# Patient Record
Sex: Female | Born: 1945
Health system: Southern US, Community
[De-identification: ages and names within clinical notes are randomized; demographics above are authoritative.]

## PROBLEM LIST (undated history)

## (undated) DIAGNOSIS — G4700 Insomnia, unspecified: Secondary | ICD-10-CM

## (undated) DIAGNOSIS — E559 Vitamin D deficiency, unspecified: Secondary | ICD-10-CM

## (undated) DIAGNOSIS — M25511 Pain in right shoulder: Secondary | ICD-10-CM

## (undated) DIAGNOSIS — IMO0001 Reserved for inherently not codable concepts without codable children: Secondary | ICD-10-CM

## (undated) DIAGNOSIS — F419 Anxiety disorder, unspecified: Secondary | ICD-10-CM

## (undated) DIAGNOSIS — T7840XA Allergy, unspecified, initial encounter: Secondary | ICD-10-CM

## (undated) DIAGNOSIS — M1990 Unspecified osteoarthritis, unspecified site: Secondary | ICD-10-CM

## (undated) DIAGNOSIS — J42 Unspecified chronic bronchitis: Secondary | ICD-10-CM

## (undated) DIAGNOSIS — Z9989 Dependence on other enabling machines and devices: Secondary | ICD-10-CM

## (undated) DIAGNOSIS — Z9889 Other specified postprocedural states: Secondary | ICD-10-CM

## (undated) DIAGNOSIS — G4733 Obstructive sleep apnea (adult) (pediatric): Secondary | ICD-10-CM

## (undated) DIAGNOSIS — R112 Nausea with vomiting, unspecified: Secondary | ICD-10-CM

## (undated) DIAGNOSIS — E785 Hyperlipidemia, unspecified: Secondary | ICD-10-CM

## (undated) DIAGNOSIS — I10 Essential (primary) hypertension: Secondary | ICD-10-CM

## (undated) DIAGNOSIS — K219 Gastro-esophageal reflux disease without esophagitis: Secondary | ICD-10-CM

## (undated) DIAGNOSIS — M25512 Pain in left shoulder: Secondary | ICD-10-CM

## (undated) HISTORY — DX: Vitamin D deficiency, unspecified: E55.9

## (undated) HISTORY — DX: Essential (primary) hypertension: I10

## (undated) HISTORY — DX: Insomnia, unspecified: G47.00

## (undated) HISTORY — DX: Pain in right shoulder: M25.511

## (undated) HISTORY — DX: Pain in left shoulder: M25.512

## (undated) HISTORY — DX: Unspecified osteoarthritis, unspecified site: M19.90

## (undated) HISTORY — PX: BREAST EXCISIONAL BIOPSY: SUR124

## (undated) HISTORY — DX: Hyperlipidemia, unspecified: E78.5

## (undated) HISTORY — PX: FRACTURE SURGERY: SHX138

## (undated) HISTORY — DX: Allergy, unspecified, initial encounter: T78.40XA

## (undated) HISTORY — DX: Anxiety disorder, unspecified: F41.9

---
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
## 1964-02-06 HISTORY — PX: APPENDECTOMY: SHX54

## 1978-02-05 HISTORY — PX: TOTAL ABDOMINAL HYSTERECTOMY: SHX209

## 1979-02-06 HISTORY — PX: CHOLECYSTECTOMY OPEN: SUR202

## 2002-04-28 ENCOUNTER — Emergency Department (HOSPITAL_COMMUNITY): Admission: EM | Admit: 2002-04-28 | Discharge: 2002-04-28 | Payer: Self-pay | Admitting: Emergency Medicine

## 2002-05-03 ENCOUNTER — Emergency Department (HOSPITAL_COMMUNITY): Admission: EM | Admit: 2002-05-03 | Discharge: 2002-05-03 | Payer: Self-pay | Admitting: Emergency Medicine

## 2007-01-28 ENCOUNTER — Encounter: Admission: RE | Admit: 2007-01-28 | Discharge: 2007-01-28 | Payer: Self-pay | Admitting: Cardiology

## 2010-09-29 ENCOUNTER — Ambulatory Visit: Payer: Self-pay | Admitting: Internal Medicine

## 2010-10-13 ENCOUNTER — Encounter: Payer: Self-pay | Admitting: Internal Medicine

## 2010-10-13 DIAGNOSIS — Z0001 Encounter for general adult medical examination with abnormal findings: Secondary | ICD-10-CM | POA: Insufficient documentation

## 2010-10-18 ENCOUNTER — Encounter: Payer: Self-pay | Admitting: Internal Medicine

## 2010-10-18 ENCOUNTER — Ambulatory Visit (INDEPENDENT_AMBULATORY_CARE_PROVIDER_SITE_OTHER): Payer: Medicare Other | Admitting: Internal Medicine

## 2010-10-18 ENCOUNTER — Other Ambulatory Visit (INDEPENDENT_AMBULATORY_CARE_PROVIDER_SITE_OTHER): Payer: Medicare Other

## 2010-10-18 VITALS — BP 142/72 | HR 88 | Temp 98.3°F | Ht 62.0 in | Wt 264.5 lb

## 2010-10-18 DIAGNOSIS — I10 Essential (primary) hypertension: Secondary | ICD-10-CM

## 2010-10-18 DIAGNOSIS — F329 Major depressive disorder, single episode, unspecified: Secondary | ICD-10-CM | POA: Insufficient documentation

## 2010-10-18 DIAGNOSIS — Z0000 Encounter for general adult medical examination without abnormal findings: Secondary | ICD-10-CM

## 2010-10-18 DIAGNOSIS — J302 Other seasonal allergic rhinitis: Secondary | ICD-10-CM | POA: Insufficient documentation

## 2010-10-18 DIAGNOSIS — M25512 Pain in left shoulder: Secondary | ICD-10-CM

## 2010-10-18 DIAGNOSIS — M1990 Unspecified osteoarthritis, unspecified site: Secondary | ICD-10-CM | POA: Insufficient documentation

## 2010-10-18 DIAGNOSIS — J45909 Unspecified asthma, uncomplicated: Secondary | ICD-10-CM | POA: Insufficient documentation

## 2010-10-18 DIAGNOSIS — Z23 Encounter for immunization: Secondary | ICD-10-CM

## 2010-10-18 DIAGNOSIS — F32A Depression, unspecified: Secondary | ICD-10-CM | POA: Insufficient documentation

## 2010-10-18 DIAGNOSIS — M25519 Pain in unspecified shoulder: Secondary | ICD-10-CM

## 2010-10-18 DIAGNOSIS — M25511 Pain in right shoulder: Secondary | ICD-10-CM

## 2010-10-18 HISTORY — DX: Pain in right shoulder: M25.511

## 2010-10-18 HISTORY — DX: Pain in right shoulder: M25.512

## 2010-10-18 LAB — LIPID PANEL
HDL: 53 mg/dL (ref >39.00)
Total CHOL/HDL Ratio: 3
Triglycerides: 115 mg/dL (ref 0.0–149.0)

## 2010-10-18 LAB — CBC WITH DIFFERENTIAL/PLATELET
Basophils Absolute: 0.1 10*3/uL (ref 0.0–0.1)
Eosinophils Absolute: 0.3 10*3/uL (ref 0.0–0.7)
Eosinophils Relative: 4.8 % (ref 0.0–5.0)
MCV: 88.4 fl (ref 78.0–100.0)
Monocytes Absolute: 0.4 10*3/uL (ref 0.1–1.0)
Neutrophils Relative %: 55.4 % (ref 43.0–77.0)
RBC: 4.34 Mil/uL (ref 3.87–5.11)

## 2010-10-18 LAB — URINALYSIS, ROUTINE W REFLEX MICROSCOPIC
Nitrite: NEGATIVE
Total Protein, Urine: NEGATIVE
Urobilinogen, UA: 0.2 (ref 0.0–1.0)

## 2010-10-18 LAB — TSH: TSH: 1.43 u[IU]/mL (ref 0.35–5.50)

## 2010-10-18 LAB — HEPATIC FUNCTION PANEL
Albumin: 3.7 g/dL (ref 3.5–5.2)
Alkaline Phosphatase: 84 U/L (ref 39–117)
Bilirubin, Direct: 0.1 mg/dL (ref 0.0–0.3)
Total Bilirubin: 0.5 mg/dL (ref 0.3–1.2)

## 2010-10-18 LAB — BASIC METABOLIC PANEL
Calcium: 9.1 mg/dL (ref 8.4–10.5)
Chloride: 107 mEq/L (ref 96–112)
Creatinine, Ser: 0.8 mg/dL (ref 0.4–1.2)
GFR: 99.32 mL/min (ref >60.00)
Glucose, Bld: 93 mg/dL (ref 70–99)
Potassium: 3.9 mEq/L (ref 3.5–5.1)

## 2010-10-18 MED ORDER — ASPIRIN EC 81 MG PO TBEC: 81.0000 mg | DELAYED_RELEASE_TABLET | Freq: Every day | ORAL | Status: AC

## 2010-10-18 MED ORDER — TRAMADOL HCL 50 MG PO TABS: 50.0000 mg | ORAL_TABLET | Freq: Four times a day (QID) | ORAL | Status: AC | PRN

## 2010-10-18 MED ORDER — LOSARTAN POTASSIUM 100 MG PO TABS: 100.0000 mg | ORAL_TABLET | Freq: Every day | ORAL | Status: DC

## 2010-10-18 MED ORDER — ASPIRIN EC 81 MG PO TBEC: 81.0000 mg | DELAYED_RELEASE_TABLET | Freq: Every day | ORAL | Status: DC

## 2010-10-18 NOTE — Patient Instructions (Addendum)
You had the tetanus shot today, and the flu shot Your EKG was ok today Take all new medications as prescribed - the tramadol, and the losartan for blood pressure Please also take Aspirin 81 mg - (Coated only) - 1 per day (OTC) You can also take Allegra for the allergies as needed (OTC) Please go to LAB in the Basement for the blood and/or urine tests to be done today Please call the phone number [phone] (the [employer]Tree System) for results of testing in 2-3 days;  When calling, simply dial the number, and when prompted enter the MRN number above (the Medical Record Number) and the # key, then the message should start. Please call if you would like to be referred for the colonoscopy You will be contacted regarding the referral for: orthopedic Please return in 1 year for your yearly visit, or sooner if needed, with Lab testing done 3-5 days before

## 2010-10-18 NOTE — Progress Notes (Signed)
Subjective:    Patient ID: Betty Green, female    DOB: 09/12/1945, 65 y.o.   MRN: [id number]  HPI Here for wellness and f/u;  Overall doing ok;  Pt denies CP, worsening SOB, DOE, wheezing, orthopnea, PND, worsening LE edema, palpitations, dizziness or syncope.  Pt denies neurological change such as new Headache, facial or extremity weakness.  Pt denies polydipsia, polyuria, or low sugar symptoms. Pt states overall good compliance with treatment and medications, good tolerability, and trying to follow lower cholesterol diet.  Pt denies worsening depressive symptoms, suicidal ideation or panic. No fever, wt loss, night sweats, loss of appetite, or other constitutional symptoms.  Pt states good ability with ADL's, low fall risk, home safety reviewed and adequate, no significant changes in hearing or vision, and occasionally active with exercise.  Ran out of BP med, used to be on 20 mg benicar but too expensive.  Main c/o  bilat shoulder  Pain, and requests orhto f/u - had cortisone to right shoulder prior to moving here per ortho in [place], but moved before could have the left shoulder. Never had the colonoscopy yet Past Medical History  Diagnosis Date  . Bronchitis   . Allergy   . Asthma   . Depression   . Hypertension   . Arthritis    Past Surgical History  Procedure Date  . Cholecystectomy 1981  . Appendectomy 1966  . Tonsillectomy 1963  . Abdominal hysterectomy 1980  . Bilateral oophorectomy     reports that she has never smoked. She does not have any smokeless tobacco history on file. She reports that she does not drink alcohol or use illicit drugs. family history includes Arthritis in her father, mother, and other; Cancer in her other; Heart disease in her father and mother; and Hypertension in her father and mother. Allergies  Allergen Reactions  . Augmentin     Hives   No current outpatient prescriptions on file prior to visit.   Review of Systems Review of Systems    Constitutional: Negative for diaphoresis, activity change, appetite change and unexpected weight change.  HENT: Negative for hearing loss, ear pain, facial swelling, mouth sores and neck stiffness.   Eyes: Negative for pain, redness and visual disturbance.  Respiratory: Negative for shortness of breath and wheezing.   Cardiovascular: Negative for chest pain and palpitations.  Gastrointestinal: Negative for diarrhea, blood in stool, abdominal distention and rectal pain.  Genitourinary: Negative for hematuria, flank pain and decreased urine volume.  Musculoskeletal: Negative for myalgias and joint swelling.  Skin: Negative for color change and wound.  Neurological: Negative for syncope and numbness.  Hematological: Negative for adenopathy.  Psychiatric/Behavioral: Negative for hallucinations, self-injury, decreased concentration and agitation.      Objective:   Physical Exam BP 142/72  Pulse 88  Temp(Src) 98.3 F (36.8 C) (Oral)  Ht 5\' 2"  (1.575 m)  Wt 264 lb 8 oz (119.976 kg)  BMI 48.38 kg/m2  SpO2 96% Physical Exam  VS noted Constitutional: Pt is oriented to person, place, and time. Appears well-developed and well-nourished.  HENT:  Head: Normocephalic and atraumatic.  Right Ear: External ear normal.  Left Ear: External ear normal.  Nose: Nose normal.  Mouth/Throat: Oropharynx is clear and moist.  Eyes: Conjunctivae and EOM are normal. Pupils are equal, round, and reactive to light.  Neck: Normal range of motion. Neck supple. No JVD present. No tracheal deviation present.  Cardiovascular: Normal rate, regular rhythm, normal heart sounds and intact distal pulses.  Pulmonary/Chest: Effort normal and breath sounds normal.  Abdominal: Soft. Bowel sounds are normal. There is no tenderness.  Musculoskeletal: Normal range of motion. Exhibits no edema.  Lymphadenopathy:  Has no cervical adenopathy.  Neurological: Pt is alert and oriented to person, place, and time. Pt has normal  reflexes. No cranial nerve deficit.  Skin: Skin is warm and dry. No rash noted.  Psychiatric:  Has  normal mood and affect. Behavior is normal.         Assessment & Plan:

## 2010-10-18 NOTE — Assessment & Plan Note (Signed)
C/w DJD per pt hx, for tramadol prn, and refer orthopedic

## 2010-10-18 NOTE — Assessment & Plan Note (Signed)
Overall doing well, age appropriate education and counseling updated, referrals for preventative services and immunizations addressed, dietary and smoking counseling addressed, most recent labs and ECG reviewed.  I have personally reviewed and have noted: 1) the patient's medical and social history 2) The pt's use of alcohol, tobacco, and illicit drugs 3) The patient's current medications and supplements 4) Functional ability including ADL's, fall risk, home safety risk, hearing and visual impairment 5) Diet and physical activities 6) Evidence for depression or mood disorder 7) The patient's height, weight, and BMI have been recorded in the chart I have made referrals, and provided counseling and education based on review of the above For immunizations today, ECG reviewed - sinus , no acute, and declines colonscopy at this time

## 2010-10-18 NOTE — Assessment & Plan Note (Signed)
Uncontrolled, benicar too expensive - for losartan 100 qd

## 2010-10-21 ENCOUNTER — Encounter: Payer: Self-pay | Admitting: Internal Medicine

## 2011-01-11 ENCOUNTER — Telehealth: Payer: Self-pay

## 2011-01-11 MED ORDER — ALPRAZOLAM 0.25 MG PO TABS: 0.2500 mg | ORAL_TABLET | Freq: Two times a day (BID) | ORAL | Status: DC | PRN

## 2011-01-11 NOTE — Telephone Encounter (Signed)
Done hardcopy to robin  

## 2011-01-11 NOTE — Telephone Encounter (Signed)
Patient mother passed away and she needs prescription to help her get through sent to [facility] [address].

## 2011-01-11 NOTE — Telephone Encounter (Signed)
Faxed prescription to pharmacy.

## 2011-05-31 ENCOUNTER — Ambulatory Visit (INDEPENDENT_AMBULATORY_CARE_PROVIDER_SITE_OTHER): Payer: Medicare Other | Admitting: Internal Medicine

## 2011-05-31 ENCOUNTER — Encounter: Payer: Self-pay | Admitting: Internal Medicine

## 2011-05-31 VITALS — BP 100/60 | HR 112 | Temp 98.1°F | Ht 62.5 in | Wt 270.2 lb

## 2011-05-31 DIAGNOSIS — F419 Anxiety disorder, unspecified: Secondary | ICD-10-CM

## 2011-05-31 DIAGNOSIS — F411 Generalized anxiety disorder: Secondary | ICD-10-CM

## 2011-05-31 DIAGNOSIS — J309 Allergic rhinitis, unspecified: Secondary | ICD-10-CM

## 2011-05-31 DIAGNOSIS — F329 Major depressive disorder, single episode, unspecified: Secondary | ICD-10-CM

## 2011-05-31 DIAGNOSIS — G4700 Insomnia, unspecified: Secondary | ICD-10-CM | POA: Insufficient documentation

## 2011-05-31 DIAGNOSIS — F32A Depression, unspecified: Secondary | ICD-10-CM

## 2011-05-31 HISTORY — DX: Insomnia, unspecified: G47.00

## 2011-05-31 MED ORDER — ZOLPIDEM TARTRATE 10 MG PO TABS: 10.0000 mg | ORAL_TABLET | Freq: Every evening | ORAL | Status: DC | PRN

## 2011-05-31 MED ORDER — ESCITALOPRAM OXALATE 10 MG PO TABS: 10.0000 mg | ORAL_TABLET | Freq: Every day | ORAL | Status: DC

## 2011-05-31 MED ORDER — FEXOFENADINE HCL 180 MG PO TABS: 180.0000 mg | ORAL_TABLET | Freq: Every day | ORAL | Status: DC

## 2011-05-31 MED ORDER — FLUTICASONE PROPIONATE 50 MCG/ACT NA SUSP: 2.0000 | Freq: Every day | NASAL | Status: DC

## 2011-05-31 MED ORDER — METHYLPREDNISOLONE ACETATE 80 MG/ML IJ SUSP
120.0000 mg | Freq: Once | INTRAMUSCULAR | Status: AC
  Administered 2011-05-31: 120 mg via INTRAMUSCULAR

## 2011-05-31 NOTE — Assessment & Plan Note (Signed)
Cont the xanax prn,  to f/u any worsening symptoms or concerns

## 2011-05-31 NOTE — Progress Notes (Signed)
Subjective:    Patient ID: Betty Green, female    DOB: 09-14-1945, 66 y.o.   MRN: [id number]  HPI  Here to f/u with acute onset bilat left > right tinnitus, ear fullenss, eustachian valve symtpoms/popping moderate for 2 wks without fever, HA, facial pain, ST, cough and Pt denies chest pain, increased sob or doe, wheezing, orthopnea, PND, increased LE swelling, palpitations, dizziness or syncope.  Pt denies new neurological symptoms such as new headache, or facial or extremity weakness or numbness   Pt denies polydipsia, polyuria.  Does have increased stress recently assoc with several hot flashes and occas and day night sweat episodes sometime drenching.  Has had mild worsening depressive symptoms, but no suicidal ideation, or panic, though has ongoing anxiety, with increased insomnia as well, sometimes only gets 3-4 hrs sleep per night.   Pt denies fever, wt loss, night sweats, loss of appetite, or other constitutional symptoms Past Medical History  Diagnosis Date  . Bronchitis   . Allergy   . Asthma   . Depression   . Hypertension   . Arthritis   . Shoulder pain, bilateral 10/18/2010  . Anxiety 05/31/2011  . Insomnia 05/31/2011   Past Surgical History  Procedure Date  . Cholecystectomy 1981  . Appendectomy 1966  . Tonsillectomy 1963  . Abdominal hysterectomy 1980  . Bilateral oophorectomy     reports that she has never smoked. She does not have any smokeless tobacco history on file. She reports that she does not drink alcohol or use illicit drugs. family history includes Arthritis in her father, mother, and other; Cancer in her other; Heart disease in her father and mother; and Hypertension in her father and mother. Allergies  Allergen Reactions  . Augmentin     Hives   Current Outpatient Prescriptions on File Prior to Visit  Medication Sig Dispense Refill  . ALPRAZolam (XANAX) 0.25 MG tablet Take 1 tablet (0.25 mg total) by mouth 2 (two) times daily as needed for anxiety.  60  tablet  0  . aspirin EC 81 MG tablet Take 1 tablet (81 mg total) by mouth daily.  150 tablet  2  . losartan (COZAAR) 100 MG tablet Take 1 tablet (100 mg total) by mouth daily.  30 tablet  11  . escitalopram (LEXAPRO) 10 MG tablet Take 1 tablet (10 mg total) by mouth daily.  90 tablet  3  . fexofenadine (ALLEGRA) 180 MG tablet Take 1 tablet (180 mg total) by mouth daily.  30 tablet  11  . fluticasone (FLONASE) 50 MCG/ACT nasal spray Place 2 sprays into the nose daily.  16 g  2  . zolpidem (AMBIEN) 10 MG tablet Take 1 tablet (10 mg total) by mouth at bedtime as needed for sleep.  30 tablet  5   No current facility-administered medications on file prior to visit.   Review of Systems Review of Systems  Constitutional: Negative for diaphoresis and unexpected weight change.    Eyes: Negative for photophobia and visual disturbance.  Respiratory: Negative for choking and stridor.   Gastrointestinal: Negative for vomiting and blood in stool.  Genitourinary: Negative for hematuria and decreased urine volume.  Musculoskeletal: Negative for gait problem.  Skin: Negative for color change and wound.  Neurological: Negative for tremors and numbness.  Objective:   Physical Exam BP 100/60  Pulse 112  Temp(Src) 98.1 F (36.7 C) (Oral)  Ht 5' 2.5" (1.588 m)  Wt 270 lb 4 oz (122.585 kg)  BMI 48.64 kg/m2  SpO2 95% Physical Exam  VS noted, not ill appaering Constitutional: Pt appears well-developed and well-nourished.  HENT: Head: Normocephalic.  Right Ear: External ear normal.  Left Ear: External ear normal.  Bilat tm's mild erythema.  Sinus nontender.  Pharynx mild erythema Eyes: Conjunctivae and EOM are normal. Pupils are equal, round, and reactive to light.  Neck: Normal range of motion. Neck supple.  Cardiovascular: Normal rate and regular rhythm.   Pulmonary/Chest: Effort normal and breath sounds normal.  Neurological: Pt is alert. Not confused Psychiatric: Pt behavior is normal. Thought  content normal. 1+ nervous, mild depressed affect    Assessment & Plan:

## 2011-05-31 NOTE — Assessment & Plan Note (Addendum)
With hot flashes, for lexapro 10 qd,  to f/u any worsening symptoms or concerns, verified nonsuicidal, delcines counseling

## 2011-05-31 NOTE — Patient Instructions (Addendum)
You had the steroid shot today Take all new medications as prescribed - the allegra and flonase as directed, the lexapro and [place] Continue all other medications as before You will be contacted regarding the referral for: allergist You can also take Mucinex (or it's generic off brand) for congestion

## 2011-05-31 NOTE — Assessment & Plan Note (Signed)
For ambien prn,,  to f/u any worsening symptoms or concerns

## 2011-05-31 NOTE — Assessment & Plan Note (Signed)
Mod flare for depomedrol IM, and allegra/flonase, as well as mucinex otc prn

## 2011-06-04 ENCOUNTER — Ambulatory Visit (INDEPENDENT_AMBULATORY_CARE_PROVIDER_SITE_OTHER): Payer: Medicare Other | Admitting: Internal Medicine

## 2011-06-04 ENCOUNTER — Other Ambulatory Visit: Payer: Medicare Other

## 2011-06-04 ENCOUNTER — Encounter: Payer: Self-pay | Admitting: Internal Medicine

## 2011-06-04 ENCOUNTER — Other Ambulatory Visit: Payer: Self-pay | Admitting: Internal Medicine

## 2011-06-04 VITALS — BP 126/82 | HR 113 | Ht 63.0 in | Wt 269.8 lb

## 2011-06-04 DIAGNOSIS — J309 Allergic rhinitis, unspecified: Secondary | ICD-10-CM

## 2011-06-04 DIAGNOSIS — J45909 Unspecified asthma, uncomplicated: Secondary | ICD-10-CM

## 2011-06-04 DIAGNOSIS — G4733 Obstructive sleep apnea (adult) (pediatric): Secondary | ICD-10-CM

## 2011-06-04 NOTE — Patient Instructions (Signed)
Order- lab- Allergy/ Food Allergy profile    Dx allergic rhinitis  Order- schedule split protocol NPSG  Dx OSA  Schedule return for allergy skin testing- Antihistamines like loratadine block the skin tests. Please do not take any antihistamines, cold or sinus preparations,  otc sleep medicines for 3 days before the skin tests.   In the meantime, suggest you try taking an otc antihistamine Claritin/ loratadine as needed for itching and allergy   Consider the dust and pollen precaution information I gave you.

## 2011-06-04 NOTE — Progress Notes (Signed)
06/04/11- 66 yoF former smoker referred by Dr Jonny Ruiz for allergy evaluation. She also gives hx of past dx of obstructive sleep apnea, tested and treated years ago in [place]. No longer has CPAP. Recognized respiratory allergies since at least 1983. Symptoms had been seasonal years ago but has been perennial in recent years. She had lived in [place] until age 66 and returned 66 years ago. While living in [place] she had allergy shots which helped. Triggers have included cats, seasonal pollens and strong odors such as perfumes. Wheezes intermittently, mostly at night. History of sleep apnea but quit CPAP because it was "too loud". Surgery has included tonsillectomy. Medical problems have included hypertension asthma allergies and sleep apnea. She denies history of intolerance to foods, unusual reaction to cosmetics or to insect stings. Environment-condo, indoor heat makes her itch, denies mold, no pets or smokers.  Prior to Admission medications   Medication Sig Start Date End Date Taking? Authorizing Provider  aspirin EC 81 MG tablet Take 1 tablet (81 mg total) by mouth daily. 10/18/10 10/18/15 Yes Corwin Levins, MD  fluticasone (FLONASE) 50 MCG/ACT nasal spray Place 2 sprays into the nose daily. 05/31/11 05/30/12 Yes Corwin Levins, MD  losartan (COZAAR) 100 MG tablet Take 1 tablet (100 mg total) by mouth daily. 10/18/10 10/18/11 Yes Corwin Levins, MD  zolpidem (AMBIEN) 10 MG tablet Take 1 tablet (10 mg total) by mouth at bedtime as needed for sleep. 05/31/11 06/30/11 Yes Corwin Levins, MD   Past Medical History  Diagnosis Date  . Bronchitis   . Allergy   . Asthma   . Depression   . Hypertension   . Arthritis   . Shoulder pain, bilateral 10/18/2010  . Anxiety 05/31/2011  . Insomnia 05/31/2011   Past Surgical History  Procedure Date  . Cholecystectomy 1981  . Appendectomy 1966  . Tonsillectomy 1963  . Abdominal hysterectomy 1980  . Bilateral oophorectomy    Family History  Problem  Relation Age of Onset  . Arthritis Mother   . Heart disease Mother   . Hypertension Mother   . Arthritis Father   . Heart disease Father   . Hypertension Father   . Arthritis Other   . Cancer Other     colon and prostate cancer   History   Social History  . Marital Status: Single    Spouse Name: N/A    Number of Children: 2  . Years of Education: 12   Occupational History  . retired [employer]   Social History Main Topics  . Smoking status: Former Smoker -- 0.1 packs/day for 2 years    Types: Cigarettes    Quit date: 02/06/1984  . Smokeless tobacco: Not on file  . Alcohol Use: No  . Drug Use: No  . Sexually Active: Not on file   Other Topics Concern  . Not on file   Social History Narrative  . No narrative on file   ROS-see HPI Constitutional:   No-   weight loss, night sweats, fevers, chills, fatigue, lassitude. HEENT:   No-  headaches, difficulty swallowing, tooth/dental problems, sore throat, + tinnitus      +  sneezing, itching, ear ache, nasal congestion, post nasal drip,  CV:  No-   chest pain, orthopnea, PND, swelling in lower extremities, anasarca,  dizziness, palpitations Resp: +shortness of breath with exertion or at rest.              No-   productive cough,  No non-productive cough,  No- coughing up of blood.              No-   change in color of mucus.  No- wheezing.   Skin: No-   rash or lesions. GI:  + heartburn, indigestion, No- abdominal pain, nausea, vomiting, diarrhea,                 change in bowel habits, loss of appetite GU: No-   dysuria, change in color of urine, no urgency or frequency.  No- flank pain. MS:  A+   joint pain or swelling.  No- decreased range of motion.  No- back pain. Neuro-     nothing unusual Psych:  No- change in mood or affect. No depression or anxiety.  No memory loss.  OBJ- Physical Exam General- Alert, Oriented, Affect-appropriate, Distress- none acute, obese Skin- rash-none, lesions- none, excoriation-  none Lymphadenopathy- none Head- atraumatic            Eyes- Gross vision intact, PERRLA, conjunctivae and secretions clear            Ears- Hearing, canals-normal            Nose- Clear, no-Septal dev, mucus, polyps, erosion, perforation             Throat- Mallampati III , mucosa clear , drainage- none, tonsils- atrophic Neck- flexible , trachea midline, no stridor , thyroid nl, carotid no bruit Chest - symmetrical excursion , unlabored           Heart/CV- RRR , no murmur , no gallop  , no rub, nl s1 s2                           - JVD- none , edema- none, stasis changes- none, varices- none           Lung- clear to P&A, wheeze- none, cough- none , dullness-none, rub- none           Chest wall-  Abd- tender-no, distended-no, bowel sounds-present, HSM- no Br/ Gen/ Rectal- Not done, not indicated Extrem- cyanosis- none, clubbing, none, atrophy- none, strength- nl Neuro- grossly intact to observation

## 2011-06-05 LAB — ALLERGY FULL AND FOOD SPECIFIC PROFILE
Allergen, D pternoyssinus,d7: 19.9 kU/L — ABNORMAL HIGH (ref <0.35)
Allergen,Goose feathers, e70: <0.1 kU/L (ref <0.35)
Alternaria Alternata: 0.13 kU/L (ref <0.35)
Bahia Grass: <0.1 kU/L (ref <0.35)
Candida Albicans: 49.5 kU/L — ABNORMAL HIGH (ref <0.35)
Common Ragweed: 0.37 kU/L — ABNORMAL HIGH (ref <0.35)
D. farinae: 43.5 kU/L — ABNORMAL HIGH (ref <0.35)
Fish Cod: <0.1 kU/L (ref <0.35)
G005 Rye, Perennial: <0.1 kU/L (ref <0.35)
G009 Red Top: <0.1 kU/L (ref <0.35)
Helminthosporium halodes: <0.1 kU/L (ref <0.35)
Lamb's Quarters: 0.11 kU/L (ref <0.35)
Oak: <0.1 kU/L (ref <0.35)
Peanut IgE: 0.51 kU/L — ABNORMAL HIGH (ref <0.35)
Plantain: <0.1 kU/L (ref <0.35)
Shrimp IgE: 0.38 kU/L — ABNORMAL HIGH (ref <0.35)
Soybean IgE: <0.1 kU/L (ref <0.35)
Timothy Grass: <0.1 kU/L (ref <0.35)
Tomato IgE: <0.1 kU/L (ref <0.35)
Wheat IgE: <0.1 kU/L (ref <0.35)

## 2011-06-06 NOTE — Progress Notes (Signed)
Quick Note:  Spoke with pt and notified of results per Dr. Young. Pt verbalized understanding and denied any questions.  ______ 

## 2011-06-07 NOTE — Assessment & Plan Note (Addendum)
Plan-educated on and environmental precautions for dust control. Allergy profile. Loratadine. Schedule return for allergy skin testing.

## 2011-06-07 NOTE — Assessment & Plan Note (Signed)
Mild intermittent asthma, mostly at night.

## 2011-06-07 NOTE — Assessment & Plan Note (Signed)
History of documented sleep apnea, noncompliant with CPAP which was stopped years ago. She does wish to reassess this problem. Plan-schedule skin testing.

## 2011-06-20 ENCOUNTER — Ambulatory Visit: Payer: Medicare Other | Admitting: Internal Medicine

## 2011-06-26 ENCOUNTER — Encounter (HOSPITAL_BASED_OUTPATIENT_CLINIC_OR_DEPARTMENT_OTHER): Payer: Medicare Other

## 2011-07-17 ENCOUNTER — Ambulatory Visit (HOSPITAL_BASED_OUTPATIENT_CLINIC_OR_DEPARTMENT_OTHER): Payer: Medicare Other | Attending: Internal Medicine | Admitting: General Practice

## 2011-07-17 DIAGNOSIS — G4733 Obstructive sleep apnea (adult) (pediatric): Secondary | ICD-10-CM | POA: Insufficient documentation

## 2011-07-21 DIAGNOSIS — R0609 Other forms of dyspnea: Secondary | ICD-10-CM

## 2011-07-21 DIAGNOSIS — R0989 Other specified symptoms and signs involving the circulatory and respiratory systems: Secondary | ICD-10-CM

## 2011-07-21 DIAGNOSIS — G4733 Obstructive sleep apnea (adult) (pediatric): Secondary | ICD-10-CM

## 2011-07-21 NOTE — Procedures (Signed)
NAME:  Betty Green, Betty Green                 ACCOUNT NO.:  [id number]  MEDICAL RECORD NO.:  [id number]          PATIENT TYPE:  OUT  LOCATION:  SLEEP CENTER                 FACILITY:  [facility]  PHYSICIAN:  Tayra Dawe D. Maple Hudson, MD, FCCP, FACPDATE OF BIRTH:  07[birth year]  DATE OF STUDY:  07/17/2011                           NOCTURNAL POLYSOMNOGRAM  REFERRING PHYSICIAN:  Camerin Jimenez D. Felishia Wartman, MD, FCCP, FACP  INDICATION FOR STUDY:  Hypersomnia with sleep apnea.  EPWORTH SLEEPINESS SCORE:  1/24, BMI 48, weight 271 pounds, height 63 inches, neck 14 inches.  MEDICATIONS:  Home medications are charted and reviewed.  SLEEP ARCHITECTURE:  Split-study protocol.  During the diagnostic phase, total sleep time 128 minutes with sleep efficiency 75.7%.  Stage I was 19.9%, stage II 79.3%, stage III 0.8%, REM absent.  Sleep latency 18.5 minutes, awake after sleep onset 20 minutes, arousal index 53.9.  BEDTIME MEDICATION:  None.  Sleep was marked by fragmentation with frequent awakenings throughout the night.  RESPIRATORY DATA:  Split-study protocol.  Apnea-hypopnea index (AHI) 37 per hour.  A total of 79 events was scored including 59 obstructive apneas and 20 hypopneas.  The events were associated with non supine sleep position.  CPAP was titrated to 12 CWP, AHI 3.2 per hour.  She wore a standard [employer] Pilairo nasal pillow mask with heated humidifier and a C-flex setting of 3.  OXYGEN DATA:  Moderately loud snoring before CPAP with oxygen desaturation to a nadir of 91% on room air.  With CPAP titration, snoring was prevented and mean oxygen saturation held 96.8% on room air.  CARDIAC DATA:  Sinus rhythm with rare PAC.  MOVEMENT-PARASOMNIA:  Limb jerks were noted during the diagnostic phase with little effect on sleep. No bathroom trips.  IMPRESSIONS-RECOMMENDATIONS: 1. Severe obstructive sleep apnea/hypopnea syndrome, AHI 37 per hour     with supine and non supine events.  Moderately loud snoring  with     oxygen desaturation to a nadir of 91% on room air. 2. Successful CPAP titration to 12 CWP, AHI 3.2 per hour.  She wore a     standard [employer] Pilairo nasal pillow mask with heated     humidifier and a C-flex setting of 3. 3. Sleep fragmentation persisted throughout the study night.  If     clinically appropriate, consider adding a sleep medication to     assist sleep consolidation while adjusting to CPAP in the home     environment.     Caetano Oberhaus D. Maple Hudson, MD, [facility], FACP Diplomate, American Board of Sleep Medicine    CDY/MEDQ  D:  07/21/2011 11:18:59  T:  07/21/2011 19:32:55  Job:  [id number]

## 2011-09-12 ENCOUNTER — Ambulatory Visit (INDEPENDENT_AMBULATORY_CARE_PROVIDER_SITE_OTHER): Payer: Medicare Other | Admitting: Internal Medicine

## 2011-09-12 ENCOUNTER — Encounter: Payer: Self-pay | Admitting: Internal Medicine

## 2011-09-12 VITALS — BP 142/90 | HR 102 | Ht 63.0 in | Wt 267.8 lb

## 2011-09-12 DIAGNOSIS — J309 Allergic rhinitis, unspecified: Secondary | ICD-10-CM

## 2011-09-12 DIAGNOSIS — L309 Dermatitis, unspecified: Secondary | ICD-10-CM

## 2011-09-12 DIAGNOSIS — J302 Other seasonal allergic rhinitis: Secondary | ICD-10-CM

## 2011-09-12 DIAGNOSIS — G4733 Obstructive sleep apnea (adult) (pediatric): Secondary | ICD-10-CM

## 2011-09-12 DIAGNOSIS — J45909 Unspecified asthma, uncomplicated: Secondary | ICD-10-CM

## 2011-09-12 DIAGNOSIS — J3089 Other allergic rhinitis: Secondary | ICD-10-CM

## 2011-09-12 DIAGNOSIS — L259 Unspecified contact dermatitis, unspecified cause: Secondary | ICD-10-CM

## 2011-09-12 MED ORDER — PIMECROLIMUS 1 % EX CREA: TOPICAL_CREAM | Freq: Two times a day (BID) | CUTANEOUS | Status: DC

## 2011-09-12 NOTE — Patient Instructions (Addendum)
We will watch to see how well your allergies are controlled with an antihistamine like benadryl or loratadine  We can decide to start allergy shots later if needed.  Script to try Elidel cream for your eczema. This should be used only for as long as you need it, then stop till your skin flares again.   Order- DME- CPAP 12, mask of choice, humidifier, supplies   Dx OSA

## 2011-09-12 NOTE — Progress Notes (Signed)
06/04/11- 30 yoF former smoker referred by Dr Jonny Ruiz for allergy evaluation. She also gives hx of past dx of obstructive sleep apnea, tested and treated years ago in [place]. No longer has CPAP. Recognized respiratory allergies since at least 1983. Symptoms had been seasonal years ago but has been perennial in recent years. She had lived in [place] until age 66 and returned 2 years ago. While living in [place] she had allergy shots which helped. Triggers have included cats, seasonal pollens and strong odors such as perfumes. Wheezes intermittently, mostly at night. History of sleep apnea but quit CPAP because it was "too loud". Surgery has included tonsillectomy. Medical problems have included hypertension asthma allergies and sleep apnea. She denies history of intolerance to foods, unusual reaction to cosmetics or to insect stings. Environment-condo, indoor heat makes her itch, denies mold, no pets or smokers.  09/12/11- 20 yoF former smoker referred by Dr Jonny Ruiz for allergy evaluation. She also gives hx of past dx of obstructive sleep apnea, tested and treated years ago in [place]. No longer has CPAP. She blames allergies for 2 episodes of "pinkeye" since last here without exposure to recognized colds. Periorbital eczema-eyes itch worse if overdry by antihistamines. Allergy Profile 06/04/11- Total IgE 476.8, significant elevations for ragweed, dust, cat, shrimp, peanut. Educated to avoid foods that she recognizes cause problems and watch very closely identified foods which she has not associated symptoms in the past. Allergy skin tests: 09/12/2011- significant positive for grass and weed pollens, house dust and some molds but not foods. Her icthyotic/eczematoid skin made tests harder to read. OSA-NPSG 07/17/11- AHI 37/hr. severe obstructive sleep apnea CPAP titrated to 12/AHI 3.2 per hour. We reviewed these results, discussed medical concerns associated with untreated sleep apnea and  discussed treatment choices again.  ROS-see HPI Constitutional:   No-   weight loss, night sweats, fevers, chills, fatigue, lassitude. HEENT:   No-  headaches, difficulty swallowing, tooth/dental problems, sore throat, + tinnitus      +  sneezing, itching, ear ache, nasal congestion, post nasal drip,  CV:  No-   chest pain, orthopnea, PND, swelling in lower extremities, anasarca,  dizziness, palpitations Resp: +shortness of breath with exertion or at rest.              No-   productive cough,  No non-productive cough,  No- coughing up of blood.              No-   change in color of mucus.  No- wheezing.   Skin: No-   rash or lesions. GI:  + heartburn, indigestion, No- abdominal pain, nausea, vomiting,  GU:  MS:  No-    joint pain or swelling. . Neuro-     nothing unusual Psych:  No- change in mood or affect. No depression or anxiety.  No memory loss.  OBJ- Physical Exam General- Alert, Oriented, Affect-appropriate, Distress- none acute, obese Skin- changes of eczema around the neck and across her back and upper arms with no acute erosion or excoriation she Lymphadenopathy- none Head- atraumatic            Eyes- Gross vision intact, PERRLA, conjunctivae and secretions clear            Ears- Hearing, canals-normal            Nose- Clear, no-Septal dev, mucus, polyps, erosion, perforation             Throat- Mallampati III , mucosa clear , drainage- none, tonsils- atrophic  Neck- flexible , trachea midline, no stridor , thyroid nl, carotid no bruit Chest - symmetrical excursion , unlabored           Heart/CV- RRR , no murmur , no gallop  , no rub, nl s1 s2                           - JVD- none , edema- none, stasis changes- none, varices- none           Lung- clear to P&A, wheeze- none, cough- none , dullness-none, rub- none           Chest wall-  Abd- tender-no, distended-no, bowel sounds-present, HSM- no Br/ Gen/ Rectal- Not done, not indicated Extrem- cyanosis- none, clubbing, none,  atrophy- none, strength- nl Neuro- grossly intact to observation      She

## 2011-09-19 ENCOUNTER — Encounter: Payer: Self-pay | Admitting: Internal Medicine

## 2011-09-19 DIAGNOSIS — L309 Dermatitis, unspecified: Secondary | ICD-10-CM | POA: Insufficient documentation

## 2011-09-19 NOTE — Assessment & Plan Note (Signed)
Educated on obstructive sleep apnea medical concerns, issues of weight and driving safety responsibility, treatment options. Plan-begin CPAP at 12 CWP

## 2011-09-19 NOTE — Assessment & Plan Note (Signed)
Skin changes on exam chronic eczema. Plan-discussed her lack of adequate control with previous trials moisturizing topical therapies and limited use of steroids. Try Elidel with education on use of least amount for short period of time necessary.Marland Kitchen

## 2011-09-19 NOTE — Assessment & Plan Note (Signed)
Currently controlled without active wheezing. No results of allergy skin testing to state. Environmental precautions emphasized.

## 2011-09-19 NOTE — Assessment & Plan Note (Addendum)
Allergic rhinitis and conjunctivitis.  There is an important atopic component confirmed. Our discussion emphasized environmental precautions. We discussed allergy vaccine which is an option. Now during the summer she will work with antihistamines and we will reassess a return visit in the fall. She will try otc allergy eye drops such as Allaway.

## 2011-10-01 ENCOUNTER — Encounter: Payer: Self-pay | Admitting: Internal Medicine

## 2011-10-30 ENCOUNTER — Ambulatory Visit (INDEPENDENT_AMBULATORY_CARE_PROVIDER_SITE_OTHER): Payer: Medicare Other | Admitting: Internal Medicine

## 2011-10-30 ENCOUNTER — Encounter: Payer: Self-pay | Admitting: Internal Medicine

## 2011-10-30 VITALS — BP 110/72 | HR 100 | Temp 97.6°F | Ht 63.0 in | Wt 271.5 lb

## 2011-10-30 DIAGNOSIS — L309 Dermatitis, unspecified: Secondary | ICD-10-CM

## 2011-10-30 DIAGNOSIS — J45909 Unspecified asthma, uncomplicated: Secondary | ICD-10-CM

## 2011-10-30 DIAGNOSIS — I10 Essential (primary) hypertension: Secondary | ICD-10-CM

## 2011-10-30 DIAGNOSIS — L259 Unspecified contact dermatitis, unspecified cause: Secondary | ICD-10-CM

## 2011-10-30 DIAGNOSIS — Z23 Encounter for immunization: Secondary | ICD-10-CM

## 2011-10-30 DIAGNOSIS — F329 Major depressive disorder, single episode, unspecified: Secondary | ICD-10-CM

## 2011-10-30 DIAGNOSIS — F32A Depression, unspecified: Secondary | ICD-10-CM

## 2011-10-30 MED ORDER — FLUTICASONE PROPIONATE 50 MCG/ACT NA SUSP: 2.0000 | Freq: Every day | NASAL | Status: DC

## 2011-10-30 MED ORDER — HYDROCODONE-HOMATROPINE 5-1.5 MG/5ML PO SYRP: 5.0000 mL | ORAL_SOLUTION | Freq: Four times a day (QID) | ORAL | Status: DC | PRN

## 2011-10-30 MED ORDER — LOSARTAN POTASSIUM 100 MG PO TABS: 100.0000 mg | ORAL_TABLET | Freq: Every day | ORAL | Status: DC

## 2011-10-30 MED ORDER — FLUTICASONE-SALMETEROL 500-50 MCG/DOSE IN AEPB: 1.0000 | INHALATION_SPRAY | Freq: Two times a day (BID) | RESPIRATORY_TRACT | Status: DC

## 2011-10-30 MED ORDER — PREDNISONE 10 MG PO TABS: ORAL_TABLET | ORAL | Status: DC

## 2011-10-30 MED ORDER — HYDROXYZINE HCL 25 MG PO TABS: ORAL_TABLET | ORAL | Status: DC

## 2011-10-30 NOTE — Assessment & Plan Note (Signed)
stable overall by hx and exam, most recent data reviewed with pt, and pt to continue medical treatment as before BP Readings from Last 3 Encounters:  10/30/11 110/72  09/12/11 142/90  06/04/11 126/82

## 2011-10-30 NOTE — Assessment & Plan Note (Signed)
Mild to mod worsening, for atarax prn, for derm referral,  to f/u any worsening symptoms or concerns

## 2011-10-30 NOTE — Patient Instructions (Addendum)
You had the flu shot today Take all new medications as prescribed - the hydroxyzine for itching, prednisone for wheezing and eczema, and cough med Please take the Advair 500/50 twice per day for 1 month, then try to stop Your losartan was refilled You will be contacted regarding the referral for: dermatology Please keep your appointments with your specialists as you have planned - Dr Maple Hudson

## 2011-10-30 NOTE — Assessment & Plan Note (Signed)
C/w seasonal worsening it seems, for predpack asd, and advair 500/50 bid for 1 mo, then trial off (gave in samples today)

## 2011-10-30 NOTE — Progress Notes (Signed)
Subjective:    Patient ID: Betty Green, female    DOB: Jan 15, 1946, 66 y.o.   MRN: [id number]   HPI  Here with 2-3 wks onset nonprod cough with mild sob/doe with intermittent wheezing, without HA, ST, CP and Pt denies orthopnea, PND, increased LE swelling, palpitations, dizziness or syncope.  Pt denies fever, wt loss, night sweats, loss of appetite, or other constitutional symptoms  Has hx of asthma, seems similar, no current inhalers.  Due for flu shot.  Also with eczema out of control, marked itching especially to her back.  Asks for cough med, refill losartan. Pt denies new neurological symptoms such as new headache, or facial or extremity weakness or numbness   Pt denies polydipsia, polyuria.  Denies worsening depressive symptoms, suicidal ideation, or panic Past Medical History  Diagnosis Date  . Bronchitis   . Allergy   . Asthma   . Depression   . Hypertension   . Arthritis   . Shoulder pain, bilateral 10/18/2010  . Anxiety 05/31/2011  . Insomnia 05/31/2011   Past Surgical History  Procedure Date  . Cholecystectomy 1981  . Appendectomy 1966  . Tonsillectomy 1963  . Abdominal hysterectomy 1980  . Bilateral oophorectomy     reports that she quit smoking about 27 years ago. Her smoking use included Cigarettes. She has a .2 pack-year smoking history. She does not have any smokeless tobacco history on file. She reports that she does not drink alcohol or use illicit drugs. family history includes Arthritis in her father, mother, and other; Cancer in her other; Heart disease in her father and mother; and Hypertension in her father and mother. Allergies  Allergen Reactions  . Amoxicillin-Pot Clavulanate     Hives   Current Outpatient Prescriptions on File Prior to Visit  Medication Sig Dispense Refill  . aspirin EC 81 MG tablet Take 1 tablet (81 mg total) by mouth daily.  150 tablet  2  . DISCONTD: fluticasone (FLONASE) 50 MCG/ACT nasal spray Place 2 sprays into the nose daily.  16 g   2  . Fluticasone-Salmeterol (ADVAIR DISKUS) 500-50 MCG/DOSE AEPB Inhale 1 puff into the lungs 2 (two) times daily.  60 each  2  . zolpidem (AMBIEN) 10 MG tablet Take 1 tablet (10 mg total) by mouth at bedtime as needed for sleep.  30 tablet  5  . DISCONTD: losartan (COZAAR) 100 MG tablet Take 1 tablet (100 mg total) by mouth daily.  30 tablet  11   Review of Systems  Constitutional: Negative for diaphoresis and unexpected weight change.  HENT: Negative for tinnitus.   Eyes: Negative for photophobia and visual disturbance.  Respiratory: Negative for choking and stridor.   Gastrointestinal: Negative for vomiting and blood in stool.  Genitourinary: Negative for hematuria and decreased urine volume.  Musculoskeletal: Negative for gait problem.  Skin: Negative for color change and wound.  Neurological: Negative for tremors and numbness.  Psychiatric/Behavioral: Negative for decreased concentration. The patient is not hyperactive.       Objective:   Physical Exam BP 110/72  Pulse 100  Temp 97.6 F (36.4 C) (Oral)  Ht 5\' 3"  (1.6 m)  Wt 271 lb 8 oz (123.152 kg)  BMI 48.09 kg/m2  SpO2 98% Physical Exam  VS noted, not ill appearing Constitutional: Pt appears well-developed and well-nourished.  HENT: Head: Normocephalic.  Right Ear: External ear normal.  Left Ear: External ear normal.  Bilat tm's mild erythema.  Sinus nontender.  Pharynx mild erythema Eyes: Conjunctivae and  EOM are normal. Pupils are equal, round, and reactive to light.  Neck: Normal range of motion. Neck supple.  Cardiovascular: Normal rate and regular rhythm.   Pulmonary/Chest: Effort normal and breath sounds mild decreased, few wheeze bilat Neurological: Pt is alert. Not confused  Skin: Skin is warm. No erythema. + diffuse eczema plaques Psychiatric: Pt behavior is normal. Thought content normal. 1+ nervous    Assessment & Plan:

## 2011-10-30 NOTE — Assessment & Plan Note (Signed)
stable overall by hx and exam, most recent data reviewed with pt, and pt to continue medical treatment as before Lab Results  Component Value Date   WBC 5.6 10/18/2010   HGB 12.7 10/18/2010   HCT 38.4 10/18/2010   PLT 268.0 10/18/2010   GLUCOSE 93 10/18/2010   CHOL 183 10/18/2010   TRIG 115.0 10/18/2010   HDL 53.00 10/18/2010   LDLCALC 107* 10/18/2010   ALT 17 10/18/2010   AST 21 10/18/2010   NA 141 10/18/2010   K 3.9 10/18/2010   CL 107 10/18/2010   CREATININE 0.8 10/18/2010   BUN 10 10/18/2010   CO2 28 10/18/2010   TSH 1.43 10/18/2010

## 2011-10-31 ENCOUNTER — Telehealth: Payer: Self-pay | Admitting: Internal Medicine

## 2011-10-31 MED ORDER — NAPROXEN 500 MG PO TABS: 500.0000 mg | ORAL_TABLET | Freq: Two times a day (BID) | ORAL | Status: DC

## 2011-10-31 NOTE — Telephone Encounter (Signed)
Done erx 

## 2011-11-01 ENCOUNTER — Ambulatory Visit: Payer: Medicare Other | Admitting: Internal Medicine

## 2011-12-13 ENCOUNTER — Telehealth: Payer: Self-pay | Admitting: Internal Medicine

## 2011-12-13 NOTE — Telephone Encounter (Signed)
Patient calling about her [place] script.  States that the pharmacy does not have the refill order.   Last OV 9/24 per EPIC.  I called [facility] at [phone] and spoke with pharmacist.  Same is there, ready for pick up.  Patient advised.

## 2012-01-14 ENCOUNTER — Other Ambulatory Visit: Payer: Self-pay | Admitting: Internal Medicine

## 2012-01-16 ENCOUNTER — Telehealth: Payer: Self-pay | Admitting: Internal Medicine

## 2012-01-16 NOTE — Telephone Encounter (Signed)
Patient Information:  Caller Name: Daleysa  Phone: [phone]  Patient: Betty Green, Betty Green  Gender: Female  DOB: Jul 24, 1945  Age: 66 Years  PCP: Oliver Barre (Adults only)  Office Follow Up:  Does the office need to follow up with this patient?: Yes  Instructions For The Office: No copay; wants Rx for cough krs/can  RN Note:  Patient calling for refill of hycodan syrup for cough.  States has no refills.  Per Epic, Rx written 10/30/11 for hycodan syrup 5ml q 6 h prn cough, #120 ml, NR.  Triaged for cough; states has occasional wheeze.  Afebrile.  Per cough protocol, advised appt within 3 days; states has no copay so declines appt.  Info to office for provider review/Rx/callback.  May reach patient at [phone].  krs/can  Symptoms  Reason For Call & Symptoms: cough  Reviewed Health History In EMR: Yes  Reviewed Medications In EMR: Yes  Reviewed Allergies In EMR: Yes  Reviewed Surgeries / Procedures: Yes  Date of Onset of Symptoms: 01/07/2012  Guideline(s) Used:  Cough  Disposition Per Guideline:   See Within 3 Days in Office  Reason For Disposition Reached:   Allergy symptoms are also present (e.g., itchy eyes, clear nasal discharge, postnasal drip)  Advice Given:  N/A  Patient Refused Recommendation:  Patient Requests Prescription  No copay; wants Rx for cough krs/can

## 2012-01-16 NOTE — Telephone Encounter (Signed)
Last seen late sept 2013  OK to see Betty Green   Pt appears to have [employer] medicare

## 2012-01-16 NOTE — Telephone Encounter (Signed)
Called left message to call back 

## 2012-01-17 NOTE — Telephone Encounter (Signed)
Called left message to call back 

## 2012-01-17 NOTE — Telephone Encounter (Signed)
Called left detailed message of MD instructions. 

## 2012-02-07 ENCOUNTER — Telehealth: Payer: Self-pay | Admitting: Internal Medicine

## 2012-02-07 NOTE — Telephone Encounter (Signed)
Synetta Fail with [facility] returned call and stated that patient contacted her and that pt will be set up with cpap tomorrow Friday 02/08/12. Rhonda J Cobb

## 2012-02-07 NOTE — Telephone Encounter (Signed)
Called and spoke with Synetta Fail at [facility]. Synetta Fail stated that order was received in August and pt was contacted to arrange set up. Pt asked [facility] to hold order and that she would contact them back when she had her part. [facility] never contacted patient back in Sept to arrange. Spoke with Synetta Fail today and she asked that when I spoke with the patient to ask the patient to contact her directly at [facility] at [phone] and she will assist the patient with set up. Will contact the patient back later today to see if she was able to arrange set up. Rhonda J Cobb

## 2012-02-08 NOTE — Telephone Encounter (Signed)
LMOAM for pt to return my call. Was just calling to make sure pt did receive her cpap set up today. When unable to reach patient, I called [facility] and per Synetta Fail, pt was set up this morning with her CPAP. Nothing else needed. Rhonda J Cobb

## 2012-05-02 ENCOUNTER — Other Ambulatory Visit (INDEPENDENT_AMBULATORY_CARE_PROVIDER_SITE_OTHER): Payer: Medicare Other

## 2012-05-02 ENCOUNTER — Ambulatory Visit (INDEPENDENT_AMBULATORY_CARE_PROVIDER_SITE_OTHER): Payer: Medicare Other | Admitting: Internal Medicine

## 2012-05-02 ENCOUNTER — Encounter: Payer: Self-pay | Admitting: Internal Medicine

## 2012-05-02 VITALS — BP 130/62 | HR 103 | Temp 97.7°F | Ht 62.0 in | Wt 287.4 lb

## 2012-05-02 DIAGNOSIS — L304 Erythema intertrigo: Secondary | ICD-10-CM

## 2012-05-02 DIAGNOSIS — Z0000 Encounter for general adult medical examination without abnormal findings: Secondary | ICD-10-CM

## 2012-05-02 DIAGNOSIS — L538 Other specified erythematous conditions: Secondary | ICD-10-CM

## 2012-05-02 DIAGNOSIS — I10 Essential (primary) hypertension: Secondary | ICD-10-CM

## 2012-05-02 LAB — BASIC METABOLIC PANEL
Calcium: 9.3 mg/dL (ref 8.4–10.5)
GFR: 87.94 mL/min (ref >60.00)
Glucose, Bld: 94 mg/dL (ref 70–99)
Sodium: 138 mEq/L (ref 135–145)

## 2012-05-02 LAB — LIPID PANEL: HDL: 56.5 mg/dL (ref >39.00)

## 2012-05-02 LAB — TSH: TSH: 1.02 u[IU]/mL (ref 0.35–5.50)

## 2012-05-02 LAB — URINALYSIS, ROUTINE W REFLEX MICROSCOPIC
Nitrite: NEGATIVE
Urobilinogen, UA: 0.2 (ref 0.0–1.0)

## 2012-05-02 LAB — CBC WITH DIFFERENTIAL/PLATELET
Basophils Absolute: 0 10*3/uL (ref 0.0–0.1)
Eosinophils Absolute: 0.7 10*3/uL (ref 0.0–0.7)
Lymphocytes Relative: 27.9 % (ref 12.0–46.0)
MCHC: 33.1 g/dL (ref 30.0–36.0)
Neutrophils Relative %: 58.6 % (ref 43.0–77.0)
Platelets: 327 10*3/uL (ref 150.0–400.0)
RBC: 4.22 Mil/uL (ref 3.87–5.11)
RDW: 15.5 % — ABNORMAL HIGH (ref 11.5–14.6)

## 2012-05-02 LAB — HEPATIC FUNCTION PANEL
ALT: 16 U/L (ref 0–35)
Alkaline Phosphatase: 85 U/L (ref 39–117)
Bilirubin, Direct: 0.1 mg/dL (ref 0.0–0.3)
Total Protein: 8.4 g/dL — ABNORMAL HIGH (ref 6.0–8.3)

## 2012-05-02 MED ORDER — NYSTATIN 100000 UNIT/GM EX POWD: CUTANEOUS | Status: DC

## 2012-05-02 MED ORDER — OMEPRAZOLE 20 MG PO CPDR: 20.0000 mg | DELAYED_RELEASE_CAPSULE | Freq: Every day | ORAL | Status: DC

## 2012-05-02 MED ORDER — HYDROXYZINE HCL 25 MG PO TABS: ORAL_TABLET | ORAL | Status: DC

## 2012-05-02 MED ORDER — NAPROXEN 500 MG PO TABS: 500.0000 mg | ORAL_TABLET | Freq: Two times a day (BID) | ORAL | Status: DC

## 2012-05-02 NOTE — Progress Notes (Signed)
Subjective:    Patient ID: Betty Green, female    DOB: [birth year], 67 y.o.   MRN: [id number]  HPI  Here for wellness and f/u;  Overall doing ok;  Pt denies CP, worsening SOB, DOE, wheezing, orthopnea, PND, worsening LE edema, palpitations, dizziness or syncope.  Pt denies neurological change such as new headache, facial or extremity weakness.  Pt denies polydipsia, polyuria, or low sugar symptoms. Pt states overall good compliance with treatment and medications, good tolerability, and has been trying to follow lower cholesterol diet.  Pt denies worsening depressive symptoms, suicidal ideation or panic. No fever, night sweats, wt loss, loss of appetite, or other constitutional symptoms.  Pt states good ability with ADL's, has low fall risk, home safety reviewed and adequate, no other significant changes in hearing or vision, and only occasionally active with exercise.  Does have candida type rash with itching under the pannus to both groin areas Past Medical History  Diagnosis Date  . Bronchitis   . Allergy   . Asthma   . Depression   . Hypertension   . Arthritis   . Shoulder pain, bilateral 10/18/2010  . Anxiety 05/31/2011  . Insomnia 05/31/2011   Past Surgical History  Procedure Laterality Date  . Cholecystectomy  1981  . Appendectomy  1966  . Tonsillectomy  1963  . Abdominal hysterectomy  1980  . Bilateral oophorectomy      reports that she quit smoking about 28 years ago. Her smoking use included Cigarettes. She has a .2 pack-year smoking history. She does not have any smokeless tobacco history on file. She reports that she does not drink alcohol or use illicit drugs. family history includes Arthritis in her father, mother, and other; Cancer in her other; Heart disease in her father and mother; and Hypertension in her father and mother. Allergies  Allergen Reactions  . Amoxicillin-Pot Clavulanate     Hives   Current Outpatient Prescriptions on File Prior to Visit  Medication Sig  Dispense Refill  . aspirin EC 81 MG tablet Take 1 tablet (81 mg total) by mouth daily.  150 tablet  2  . fluticasone (FLONASE) 50 MCG/ACT nasal spray Place 2 sprays into the nose daily.  16 g  2  . Fluticasone-Salmeterol (ADVAIR DISKUS) 500-50 MCG/DOSE AEPB Inhale 1 puff into the lungs 2 (two) times daily.  60 each  2  . HYDROcodone-homatropine (HYCODAN) 5-1.5 MG/5ML syrup Take 5 mLs by mouth every 6 (six) hours as needed for cough.  120 mL  0  . losartan (COZAAR) 100 MG tablet Take 1 tablet (100 mg total) by mouth daily.  30 tablet  11  . predniSONE (DELTASONE) 10 MG tablet 3 tabs by mouth per day for 3 days,2tabs per day for 3 days,1tab per day for 3 days  18 tablet  0  . zolpidem (AMBIEN) 10 MG tablet Take 1 tablet (10 mg total) by mouth at bedtime as needed for sleep.  30 tablet  5   No current facility-administered medications on file prior to visit.   Review of Systems Constitutional: Negative for diaphoresis, activity change, appetite change or unexpected weight change.  HENT: Negative for hearing loss, ear pain, facial swelling, mouth sores and neck stiffness.   Eyes: Negative for pain, redness and visual disturbance.  Respiratory: Negative for shortness of breath and wheezing.   Cardiovascular: Negative for chest pain and palpitations.  Gastrointestinal: Negative for diarrhea, blood in stool, abdominal distention or other pain Genitourinary: Negative for hematuria, flank pain  or change in urine volume.  Musculoskeletal: Negative for myalgias and joint swelling.  Skin: Negative for color change and wound. other than the rash above Neurological: Negative for syncope and numbness. other than noted Hematological: Negative for adenopathy.  Psychiatric/Behavioral: Negative for hallucinations, self-injury, decreased concentration and agitation.      Objective:   Physical Exam BP 130/62  Pulse 103  Temp(Src) 97.7 F (36.5 C) (Oral)  Ht 5\' 2"  (1.575 m)  Wt 287 lb 6 oz (130.352 kg)   BMI 52.55 kg/m2  SpO2 95% VS noted, not ill appearing Constitutional: Pt appears well-developed and well-nourished./morbid obese  HENT: Head: NCAT.  Right Ear: External ear normal.  Left Ear: External ear normal.  Eyes: Conjunctivae and EOM are normal. Pupils are equal, round, and reactive to light.  Neck: Normal range of motion. Neck supple.  Cardiovascular: Normal rate and regular rhythm.   Pulmonary/Chest: Effort normal and breath sounds normal.  Abd:  Soft, NT, non-distended, + BS Neurological: Pt is alert. Not confused  Skin: Skin is warm. No erythema. except for mild maceration and nontender erythema to bilat groin Psychiatric: Pt behavior is normal. Thought content normal.     Assessment & Plan:

## 2012-05-02 NOTE — Patient Instructions (Addendum)
Please continue all other medications as before, and refills have been done if requested - the hydroxizine, and the naprosen (along with the new medication - the prilosec - to protect the stomach from ulcers) Please take all new medication as prescribed - the nystatin powder Please go to the LAB in the Basement (turn left off the elevator) for the tests to be done today You will be contacted by phone if any changes need to be made immediately.  Otherwise, you will receive a letter about your results with an explanation, but please check with MyChart first. Thank you for enrolling in MyChart. Please follow the instructions below to securely access your online medical record. MyChart allows you to send messages to your doctor, view your test results, renew your prescriptions, schedule appointments, and more. To Log into My Chart online, please go by [employer] or [employer] to [email or web address], or download the MyChart App from the [employer] of [employer] .  Your Username is: arm (pass taurus) Please send a practice Message on Mychart later today. Please return in 1 year for your yearly visit, or sooner if needed, with Lab testing done 3-5 days before

## 2012-05-03 DIAGNOSIS — L304 Erythema intertrigo: Secondary | ICD-10-CM | POA: Insufficient documentation

## 2012-05-03 NOTE — Assessment & Plan Note (Signed)
For nystatin powder asd

## 2012-05-03 NOTE — Assessment & Plan Note (Signed)

## 2012-05-19 ENCOUNTER — Ambulatory Visit (INDEPENDENT_AMBULATORY_CARE_PROVIDER_SITE_OTHER): Payer: Medicare Other | Admitting: Internal Medicine

## 2012-05-19 ENCOUNTER — Encounter: Payer: Self-pay | Admitting: Internal Medicine

## 2012-05-19 VITALS — BP 110/62 | HR 94 | Temp 98.0°F | Ht 62.0 in | Wt 289.1 lb

## 2012-05-19 DIAGNOSIS — J45909 Unspecified asthma, uncomplicated: Secondary | ICD-10-CM

## 2012-05-19 DIAGNOSIS — J309 Allergic rhinitis, unspecified: Secondary | ICD-10-CM

## 2012-05-19 DIAGNOSIS — L538 Other specified erythematous conditions: Secondary | ICD-10-CM

## 2012-05-19 DIAGNOSIS — I10 Essential (primary) hypertension: Secondary | ICD-10-CM

## 2012-05-19 DIAGNOSIS — J302 Other seasonal allergic rhinitis: Secondary | ICD-10-CM

## 2012-05-19 DIAGNOSIS — L304 Erythema intertrigo: Secondary | ICD-10-CM

## 2012-05-19 MED ORDER — METHYLPREDNISOLONE ACETATE 80 MG/ML IJ SUSP
80.0000 mg | Freq: Once | INTRAMUSCULAR | Status: AC
  Administered 2012-05-19: 80 mg via INTRAMUSCULAR

## 2012-05-19 MED ORDER — PREDNISONE 10 MG PO TABS: ORAL_TABLET | ORAL | Status: DC

## 2012-05-19 MED ORDER — FLUTICASONE PROPIONATE 50 MCG/ACT NA SUSP: 2.0000 | Freq: Every day | NASAL | Status: DC

## 2012-05-19 NOTE — Progress Notes (Signed)
Subjective:    Patient ID: Betty Green, female    DOB: [birth year], 67 y.o.   MRN: [id number]  HPI  Here to f/u; overall doing ok,  Pt denies chest pain, increased sob or doe, wheezing, orthopnea, PND, increased LE swelling, palpitations, dizziness or syncope.  Pt denies polydipsia, polyuria,   Pt denies new neurological symptoms such as new headache, or facial or extremity weakness or numbness.   Pt states overall good compliance with meds, with wt overall stable,  but little exercise however and remains with large pannus, menitons intertrigo some improved with recent tx. Does have several wks ongoing nasal allergy symptoms with clearish congestion, itch and sneezing, without fever, pain, ST, cough, swelling or wheezing. Past Medical History  Diagnosis Date  . Bronchitis   . Allergy   . Asthma   . Depression   . Hypertension   . Arthritis   . Shoulder pain, bilateral 10/18/2010  . Anxiety 05/31/2011  . Insomnia 05/31/2011   Past Surgical History  Procedure Laterality Date  . Cholecystectomy  1981  . Appendectomy  1966  . Tonsillectomy  1963  . Abdominal hysterectomy  1980  . Bilateral oophorectomy      reports that she quit smoking about 28 years ago. Her smoking use included Cigarettes. She has a .2 pack-year smoking history. She does not have any smokeless tobacco history on file. She reports that she does not drink alcohol or use illicit drugs. family history includes Arthritis in her father, mother, and other; Cancer in her other; Heart disease in her father and mother; and Hypertension in her father and mother. Allergies  Allergen Reactions  . Amoxicillin-Pot Clavulanate     Hives   Current Outpatient Prescriptions on File Prior to Visit  Medication Sig Dispense Refill  . aspirin EC 81 MG tablet Take 1 tablet (81 mg total) by mouth daily.  150 tablet  2  . Fluticasone-Salmeterol (ADVAIR DISKUS) 500-50 MCG/DOSE AEPB Inhale 1 puff into the lungs 2 (two) times daily.  60 each  2   . hydrOXYzine (ATARAX/VISTARIL) 25 MG tablet 1-2 tabs by mouth three times per day as needed for itching  90 tablet  2  . losartan (COZAAR) 100 MG tablet Take 1 tablet (100 mg total) by mouth daily.  30 tablet  11  . naproxen (NAPROSYN) 500 MG tablet Take 1 tablet (500 mg total) by mouth 2 (two) times daily with a meal.  60 tablet  3  . nystatin (MYCOSTATIN) powder Use as directed to affected area twice per day as needed for rash  60 g  5  . omeprazole (PRILOSEC) 20 MG capsule Take 1 capsule (20 mg total) by mouth daily.  90 capsule  3  . zolpidem (AMBIEN) 10 MG tablet Take 1 tablet (10 mg total) by mouth at bedtime as needed for sleep.  30 tablet  5   No current facility-administered medications on file prior to visit.   Review of Systems  Constitutional: Negative for unexpected weight change, or unusual diaphoresis  HENT: Negative for tinnitus.   Eyes: Negative for photophobia and visual disturbance.  Respiratory: Negative for choking and stridor.   Gastrointestinal: Negative for vomiting and blood in stool.  Genitourinary: Negative for hematuria and decreased urine volume.  Musculoskeletal: Negative for acute joint swelling Skin: Negative for color change and wound.  Neurological: Negative for tremors and numbness other than noted  Psychiatric/Behavioral: Negative for decreased concentration or  hyperactivity.       Objective:  Physical Exam BP 110/62  Pulse 94  Temp(Src) 98 F (36.7 C) (Oral)  Ht 5\' 2"  (1.575 m)  Wt 289 lb 2 oz (131.146 kg)  BMI 52.87 kg/m2  SpO2 97% VS noted,  Constitutional: Pt appears well-developed and well-nourished.  HENT: Head: NCAT.  Right Ear: External ear normal.  Left Ear: External ear normal.  Bilat tm's with mild erythema.  Max sinus areas non tender.  Pharynx with mild erythema, no exudate Eyes: Conjunctivae and EOM are normal. Pupils are equal, round, and reactive to light.  Neck: Normal range of motion. Neck supple.  Cardiovascular:  Normal rate and regular rhythm.   Pulmonary/Chest: Effort normal and breath sounds normal.  Abd:  Soft, NT, non-distended, + BS Neurological: Pt is alert. Not confused  Skin: Skin is warm. No erythema. except for mild maceration to bilat inguinal areas Psychiatric: Pt behavior is normal. Thought content normal.     Assessment & Plan:

## 2012-05-19 NOTE — Patient Instructions (Signed)
You had the steroid shot today Please take all new medication as prescribed  - the prednisone Please continue all other medications as before, including the flonase re-starting Please consider followup with Dr Maple Hudson, if the allergies do not seem better or well controlled

## 2012-05-23 NOTE — Assessment & Plan Note (Signed)
stable overall by history and exam, recent data reviewed with pt, and pt to continue medical treatment as before,  to f/u any worsening symptoms or concerns SpO2 Readings from Last 3 Encounters:  05/19/12 97%  05/02/12 95%  10/30/11 98%

## 2012-05-23 NOTE — Assessment & Plan Note (Signed)
Ok to cont nystatin asd,  to f/u any worsening symptoms or concerns

## 2012-05-23 NOTE — Assessment & Plan Note (Signed)
Mild flare, for depomedrol IM, low dose predpack, re-start flonase, .follwoj

## 2012-05-23 NOTE — Assessment & Plan Note (Signed)
stable overall by history and exam, recent data reviewed with pt, and pt to continue medical treatment as before,  to f/u any worsening symptoms or concerns BP Readings from Last 3 Encounters:  05/19/12 110/62  05/02/12 130/62  10/30/11 110/72

## 2012-06-11 ENCOUNTER — Telehealth: Payer: Self-pay

## 2012-06-11 MED ORDER — FLUTICASONE PROPIONATE 50 MCG/ACT NA SUSP: 2.0000 | Freq: Every day | NASAL | Status: DC

## 2012-06-11 NOTE — Telephone Encounter (Signed)
Refill

## 2012-06-27 ENCOUNTER — Ambulatory Visit (INDEPENDENT_AMBULATORY_CARE_PROVIDER_SITE_OTHER): Payer: Medicare Other | Admitting: Internal Medicine

## 2012-06-27 ENCOUNTER — Encounter: Payer: Self-pay | Admitting: Internal Medicine

## 2012-06-27 VITALS — BP 124/60 | HR 91 | Temp 98.3°F | Ht 63.0 in | Wt 286.5 lb

## 2012-06-27 DIAGNOSIS — J309 Allergic rhinitis, unspecified: Secondary | ICD-10-CM

## 2012-06-27 DIAGNOSIS — J45909 Unspecified asthma, uncomplicated: Secondary | ICD-10-CM

## 2012-06-27 DIAGNOSIS — J302 Other seasonal allergic rhinitis: Secondary | ICD-10-CM

## 2012-06-27 DIAGNOSIS — F329 Major depressive disorder, single episode, unspecified: Secondary | ICD-10-CM

## 2012-06-27 DIAGNOSIS — F3289 Other specified depressive episodes: Secondary | ICD-10-CM

## 2012-06-27 DIAGNOSIS — I10 Essential (primary) hypertension: Secondary | ICD-10-CM

## 2012-06-27 DIAGNOSIS — F32A Depression, unspecified: Secondary | ICD-10-CM

## 2012-06-27 MED ORDER — PREDNISONE 10 MG PO TABS: ORAL_TABLET | ORAL | Status: DC

## 2012-06-27 MED ORDER — ALBUTEROL SULFATE HFA 108 (90 BASE) MCG/ACT IN AERS: 2.0000 | INHALATION_SPRAY | Freq: Four times a day (QID) | RESPIRATORY_TRACT | Status: DC | PRN

## 2012-06-27 MED ORDER — METHYLPREDNISOLONE ACETATE 80 MG/ML IJ SUSP
80.0000 mg | Freq: Once | INTRAMUSCULAR | Status: AC
  Administered 2012-06-27: 80 mg via INTRAMUSCULAR

## 2012-06-27 NOTE — Patient Instructions (Signed)
You had the steroid shot today Please take all new medication as prescribed - the prednisone, and the proair HFA Please continue all other medications as before, and refills have been done if requested. Please return if not better, to consider other evaluation such as CXR

## 2012-06-27 NOTE — Progress Notes (Signed)
Subjective:    Patient ID: Betty Green, female    DOB: 10-15-1945, 67 y.o.   MRN: [id number]  HPI  Here to f/u - c/o dizzy and nausea, but seems assoc with several wks ongoing nasal allergy symptoms with clearish congestion, itch and sneezing, without fever, pain, ST, cough, swelling, but has had intermittent mild wheezing/sob/doe in the past wk as well, hard to get a large enough breath, wakes her up at night, states has not used inhalers in the past.   Pt denies fever, wt loss, night sweats, loss of appetite, or other constitutional symptoms   Pt denies polydipsia, polyuria.  Pt denies new neurological symptoms such as new headache, or facial or extremity weakness or numbness. Denies worsening depressive symptoms, suicidal ideation, or panic Past Medical History  Diagnosis Date  . Bronchitis   . Allergy   . Asthma   . Depression   . Hypertension   . Arthritis   . Shoulder pain, bilateral 10/18/2010  . Anxiety 05/31/2011  . Insomnia 05/31/2011   Past Surgical History  Procedure Laterality Date  . Cholecystectomy  1981  . Appendectomy  1966  . Tonsillectomy  1963  . Abdominal hysterectomy  1980  . Bilateral oophorectomy      reports that she quit smoking about 28 years ago. Her smoking use included Cigarettes. She has a .2 pack-year smoking history. She does not have any smokeless tobacco history on file. She reports that she does not drink alcohol or use illicit drugs. family history includes Arthritis in her father, mother, and other; Cancer in her other; Heart disease in her father and mother; and Hypertension in her father and mother. Allergies  Allergen Reactions  . Amoxicillin-Pot Clavulanate     Hives   Current Outpatient Prescriptions on File Prior to Visit  Medication Sig Dispense Refill  . aspirin EC 81 MG tablet Take 1 tablet (81 mg total) by mouth daily.  150 tablet  2  . fluticasone (FLONASE) 50 MCG/ACT nasal spray Place 2 sprays into the nose daily.  16 g  11  .  hydrOXYzine (ATARAX/VISTARIL) 25 MG tablet 1-2 tabs by mouth three times per day as needed for itching  90 tablet  2  . losartan (COZAAR) 100 MG tablet Take 1 tablet (100 mg total) by mouth daily.  30 tablet  11  . naproxen (NAPROSYN) 500 MG tablet Take 1 tablet (500 mg total) by mouth 2 (two) times daily with a meal.  60 tablet  3  . nystatin (MYCOSTATIN) powder Use as directed to affected area twice per day as needed for rash  60 g  5  . omeprazole (PRILOSEC) 20 MG capsule Take 1 capsule (20 mg total) by mouth daily.  90 capsule  3  . Fluticasone-Salmeterol (ADVAIR DISKUS) 500-50 MCG/DOSE AEPB Inhale 1 puff into the lungs 2 (two) times daily.  60 each  2  . zolpidem (AMBIEN) 10 MG tablet Take 1 tablet (10 mg total) by mouth at bedtime as needed for sleep.  30 tablet  5   No current facility-administered medications on file prior to visit.   Review of Systems  Constitutional: Negative for unexpected weight change, or unusual diaphoresis  HENT: Negative for tinnitus.   Eyes: Negative for photophobia and visual disturbance.  Respiratory: Negative for choking and stridor.   Gastrointestinal: Negative for vomiting and blood in stool.  Genitourinary: Negative for hematuria and decreased urine volume.  Musculoskeletal: Negative for acute joint swelling Skin: Negative for color change  and wound.  Neurological: Negative for tremors and numbness other than noted  Psychiatric/Behavioral: Negative for decreased concentration or  hyperactivity.       Objective:   Physical Exam BP 124/60  Pulse 91  Temp(Src) 98.3 F (36.8 C) (Oral)  Ht 5\' 3"  (1.6 m)  Wt 286 lb 8 oz (129.956 kg)  BMI 50.76 kg/m2  SpO2 96% VS noted,  Constitutional: Pt appears well-developed and well-nourished.  HENT: Head: NCAT.  Right Ear: External ear normal.  Left Ear: External ear normal.  Bilat tm's with mild erythema.  Max sinus areas mild tender.  Pharynx with mild erythema, no exudate Eyes: Conjunctivae and EOM are  normal. Pupils are equal, round, and reactive to light.  Neck: Normal range of motion. Neck supple.  Cardiovascular: Normal rate and regular rhythm.   Pulmonary/Chest: Effort normal and breath sounds mild decreased, few bilat wheezes.  Abd:  Soft, NT, non-distended, + BS Neurological: Pt is alert. Not confused  Skin: Skin is warm. No erythema.  Psychiatric: Pt behavior is normal. Thought content normal.     Assessment & Plan:

## 2012-06-28 NOTE — Assessment & Plan Note (Signed)
For tx as above, for proai HFA as well,  to f/u any worsening symptoms or concerns

## 2012-06-28 NOTE — Assessment & Plan Note (Signed)
stable overall by history and exam, recent data reviewed with pt, and pt to continue medical treatment as before,  to f/u any worsening symptoms or concerns BP Readings from Last 3 Encounters:  06/27/12 124/60  05/19/12 110/62  05/02/12 130/62

## 2012-06-28 NOTE — Assessment & Plan Note (Signed)
stable overall by history and exam, recent data reviewed with pt, and pt to continue medical treatment as before,  to f/u any worsening symptoms or concerns Lab Results  Component Value Date   WBC 7.5 05/02/2012   HGB 12.3 05/02/2012   HCT 37.1 05/02/2012   PLT 327.0 05/02/2012   GLUCOSE 94 05/02/2012   CHOL 205* 05/02/2012   TRIG 100.0 05/02/2012   HDL 56.50 05/02/2012   LDLDIRECT 134.1 05/02/2012   LDLCALC 107* 10/18/2010   ALT 16 05/02/2012   AST 17 05/02/2012   NA 138 05/02/2012   K 4.4 05/02/2012   CL 103 05/02/2012   CREATININE 0.8 05/02/2012   BUN 10 05/02/2012   CO2 28 05/02/2012   TSH 1.02 05/02/2012

## 2012-06-28 NOTE — Assessment & Plan Note (Signed)
Mild to mod flare, for med restart, depomedrol IM and predpack asd, ,  to f/u any worsening symptoms or concerns

## 2012-07-04 ENCOUNTER — Telehealth: Payer: Self-pay

## 2012-07-04 MED ORDER — ALBUTEROL SULFATE HFA 108 (90 BASE) MCG/ACT IN AERS: 2.0000 | INHALATION_SPRAY | Freq: Four times a day (QID) | RESPIRATORY_TRACT | Status: DC | PRN

## 2012-07-04 NOTE — Telephone Encounter (Signed)
Fax from pharmacy to inform insurance will not pay for proventil hfa, but will pay for proair

## 2012-07-04 NOTE — Telephone Encounter (Signed)
Ok to change - to robin to help

## 2012-08-04 ENCOUNTER — Ambulatory Visit (INDEPENDENT_AMBULATORY_CARE_PROVIDER_SITE_OTHER): Payer: Medicare Other | Admitting: Internal Medicine

## 2012-08-04 ENCOUNTER — Encounter: Payer: Self-pay | Admitting: Internal Medicine

## 2012-08-04 VITALS — BP 112/68 | HR 110 | Temp 98.0°F | Ht 63.0 in | Wt 288.1 lb

## 2012-08-04 DIAGNOSIS — J309 Allergic rhinitis, unspecified: Secondary | ICD-10-CM

## 2012-08-04 DIAGNOSIS — F329 Major depressive disorder, single episode, unspecified: Secondary | ICD-10-CM

## 2012-08-04 DIAGNOSIS — I10 Essential (primary) hypertension: Secondary | ICD-10-CM

## 2012-08-04 DIAGNOSIS — F32A Depression, unspecified: Secondary | ICD-10-CM

## 2012-08-04 DIAGNOSIS — J302 Other seasonal allergic rhinitis: Secondary | ICD-10-CM

## 2012-08-04 DIAGNOSIS — E785 Hyperlipidemia, unspecified: Secondary | ICD-10-CM

## 2012-08-04 DIAGNOSIS — J209 Acute bronchitis, unspecified: Secondary | ICD-10-CM | POA: Insufficient documentation

## 2012-08-04 HISTORY — DX: Hyperlipidemia, unspecified: E78.5

## 2012-08-04 MED ORDER — AZITHROMYCIN 250 MG PO TABS: ORAL_TABLET | ORAL | Status: DC

## 2012-08-04 MED ORDER — HYDROCODONE-HOMATROPINE 5-1.5 MG/5ML PO SYRP: 5.0000 mL | ORAL_SOLUTION | Freq: Four times a day (QID) | ORAL | Status: DC | PRN

## 2012-08-04 NOTE — Assessment & Plan Note (Signed)
stable overall by history and exam, recent data reviewed with pt, and pt to continue medical treatment as before,  to f/u next visit with labs after lower chol diet, d/w pt today Lab Results  Component Value Date   LDLCALC 107* 10/18/2010

## 2012-08-04 NOTE — Assessment & Plan Note (Signed)
stable overall by history and exam, recent data reviewed with pt, and pt to continue medical treatment as before,  to f/u any worsening symptoms or concerns BP Readings from Last 3 Encounters:  08/04/12 112/68  06/27/12 124/60  05/19/12 110/62

## 2012-08-04 NOTE — Assessment & Plan Note (Signed)
Mild to mod, for antibx course,  to f/u any worsening symptoms or concerns, 

## 2012-08-04 NOTE — Progress Notes (Signed)
Subjective:    Patient ID: Betty Green, female    DOB: November 01, 1945, 67 y.o.   MRN: [id number]  HPI  Here with acute onset mild to mod 2 wks ST, HA, general weakness and malaise, with prod cough greenish sputum, but Pt denies chest pain, increased sob or doe, wheezing, orthopnea, PND, increased LE swelling, palpitations, dizziness or syncope.  Had some wheezing last wk, but mostly bronchial congestion at night, none today. Denies worsening depressive symptoms, suicidal ideation, or panic.  Does have several months ongoing nasal allergy symptoms with clearish congestion, itch and sneezing, but this past 2 wks was different Past Medical History  Diagnosis Date  . Bronchitis   . Allergy   . Asthma   . Depression   . Hypertension   . Arthritis   . Shoulder pain, bilateral 10/18/2010  . Anxiety 05/31/2011  . Insomnia 05/31/2011   Past Surgical History  Procedure Laterality Date  . Cholecystectomy  1981  . Appendectomy  1966  . Tonsillectomy  1963  . Abdominal hysterectomy  1980  . Bilateral oophorectomy      reports that she quit smoking about 28 years ago. Her smoking use included Cigarettes. She has a .2 pack-year smoking history. She does not have any smokeless tobacco history on file. She reports that she does not drink alcohol or use illicit drugs. family history includes Arthritis in her father, mother, and other; Cancer in her other; Heart disease in her father and mother; and Hypertension in her father and mother. Allergies  Allergen Reactions  . Amoxicillin-Pot Clavulanate     Hives   Current Outpatient Prescriptions on File Prior to Visit  Medication Sig Dispense Refill  . albuterol (PROAIR HFA) 108 (90 BASE) MCG/ACT inhaler Inhale 2 puffs into the lungs every 6 (six) hours as needed for wheezing.  1 Inhaler  6  . albuterol (PROVENTIL HFA;VENTOLIN HFA) 108 (90 BASE) MCG/ACT inhaler Inhale 2 puffs into the lungs every 6 (six) hours as needed for wheezing.  1 Inhaler  11  .  aspirin EC 81 MG tablet Take 1 tablet (81 mg total) by mouth daily.  150 tablet  2  . hydrOXYzine (ATARAX/VISTARIL) 25 MG tablet 1-2 tabs by mouth three times per day as needed for itching  90 tablet  2  . losartan (COZAAR) 100 MG tablet Take 1 tablet (100 mg total) by mouth daily.  30 tablet  11  . naproxen (NAPROSYN) 500 MG tablet Take 1 tablet (500 mg total) by mouth 2 (two) times daily with a meal.  60 tablet  3  . omeprazole (PRILOSEC) 20 MG capsule Take 1 capsule (20 mg total) by mouth daily.  90 capsule  3   No current facility-administered medications on file prior to visit.   Review of Systems  Constitutional: Negative for unexpected weight change, or unusual diaphoresis  HENT: Negative for tinnitus.   Eyes: Negative for photophobia and visual disturbance.  Respiratory: Negative for choking and stridor.   Gastrointestinal: Negative for vomiting and blood in stool.  Genitourinary: Negative for hematuria and decreased urine volume.  Musculoskeletal: Negative for acute joint swelling Skin: Negative for color change and wound.  Neurological: Negative for tremors and numbness other than noted  Psychiatric/Behavioral: Negative for decreased concentration or  hyperactivity.       Objective:   Physical Exam BP 112/68  Pulse 110  Temp(Src) 98 F (36.7 C) (Oral)  Ht 5\' 3"  (1.6 m)  Wt 288 lb 2 oz (130.693 kg)  BMI 51.05 kg/m2  SpO2 96% VS noted, morbid obese Constitutional: mild ill appearing HENT: Head: NCAT.  Right Ear: External ear normal.  Left Ear: External ear normal.  Bilat tm's with mild erythema.  Max sinus areas mild tender.  Pharynx with mild erythema, no exudate Eyes: Conjunctivae and EOM are normal. Pupils are equal, round, and reactive to light.  Neck: Normal range of motion. Neck supple.  Cardiovascular: Normal rate and regular rhythm.   Pulmonary/Chest: Effort normal and breath sounds decreased bilat but now rales or wheezing.  Neurological: Pt is alert. Not  confused  Skin: Skin is warm. No erythema.  Psychiatric: Pt behavior is normal. Thought content normal. mild nervous, not depressed affect     Assessment & Plan:

## 2012-08-04 NOTE — Assessment & Plan Note (Signed)
stable overall by history and exam, recent data reviewed with pt, and pt to continue medical treatment as before,  to f/u any worsening symptoms or concerns Lab Results  Component Value Date   WBC 7.5 05/02/2012   HGB 12.3 05/02/2012   HCT 37.1 05/02/2012   PLT 327.0 05/02/2012   GLUCOSE 94 05/02/2012   CHOL 205* 05/02/2012   TRIG 100.0 05/02/2012   HDL 56.50 05/02/2012   LDLDIRECT 134.1 05/02/2012   LDLCALC 107* 10/18/2010   ALT 16 05/02/2012   AST 17 05/02/2012   NA 138 05/02/2012   K 4.4 05/02/2012   CL 103 05/02/2012   CREATININE 0.8 05/02/2012   BUN 10 05/02/2012   CO2 28 05/02/2012   TSH 1.02 05/02/2012

## 2012-08-04 NOTE — Assessment & Plan Note (Signed)
Has gotten away from her med, encouraged re-start

## 2012-08-04 NOTE — Patient Instructions (Signed)
Please take all new medication as prescribed Please continue all other medications as before  Please remember to sign up for My Chart if you have not done so, as this will be important to you in the future with finding out test results, communicating by private email, and scheduling acute appointments online when needed.   

## 2012-08-06 ENCOUNTER — Telehealth: Payer: Self-pay | Admitting: *Deleted

## 2012-08-06 MED ORDER — LEVOFLOXACIN 250 MG PO TABS: 250.0000 mg | ORAL_TABLET | Freq: Every day | ORAL | Status: DC

## 2012-08-06 NOTE — Telephone Encounter (Signed)
Ok change to levaquin  zpack added to intolerance list

## 2012-08-06 NOTE — Telephone Encounter (Signed)
Pt called states Azithromycin has caused severe abdominal pain.  Please advise

## 2012-08-06 NOTE — Telephone Encounter (Signed)
Patient informed. 

## 2012-09-09 ENCOUNTER — Telehealth: Payer: Self-pay | Admitting: *Deleted

## 2012-09-09 MED ORDER — ALPRAZOLAM 0.25 MG PO TABS: 0.2500 mg | ORAL_TABLET | Freq: Two times a day (BID) | ORAL | Status: DC | PRN

## 2012-09-09 NOTE — Telephone Encounter (Signed)
Pt called states her brother passed in [place] and is requesting something to help with anxiety before she leaves tomorrow.  Please advise

## 2012-09-09 NOTE — Telephone Encounter (Signed)
Done hardcopy to robin  

## 2012-09-10 ENCOUNTER — Other Ambulatory Visit: Payer: Self-pay

## 2012-09-10 NOTE — Telephone Encounter (Signed)
Faxed hardcopy to [facility] [place]

## 2012-10-20 ENCOUNTER — Ambulatory Visit: Payer: Medicare Other | Admitting: Internal Medicine

## 2012-10-20 ENCOUNTER — Encounter: Payer: Self-pay | Admitting: Internal Medicine

## 2012-10-20 ENCOUNTER — Ambulatory Visit (INDEPENDENT_AMBULATORY_CARE_PROVIDER_SITE_OTHER): Payer: Medicare Other | Admitting: Internal Medicine

## 2012-10-20 VITALS — BP 132/80 | HR 111 | Temp 98.8°F | Wt 304.5 lb

## 2012-10-20 DIAGNOSIS — M1710 Unilateral primary osteoarthritis, unspecified knee: Secondary | ICD-10-CM

## 2012-10-20 DIAGNOSIS — R609 Edema, unspecified: Secondary | ICD-10-CM

## 2012-10-20 DIAGNOSIS — Z23 Encounter for immunization: Secondary | ICD-10-CM

## 2012-10-20 DIAGNOSIS — M1711 Unilateral primary osteoarthritis, right knee: Secondary | ICD-10-CM

## 2012-10-20 MED ORDER — TRAMADOL HCL 50 MG PO TABS: 50.0000 mg | ORAL_TABLET | Freq: Three times a day (TID) | ORAL | Status: DC | PRN

## 2012-10-20 MED ORDER — HYDROCHLOROTHIAZIDE 25 MG PO TABS: 25.0000 mg | ORAL_TABLET | Freq: Every day | ORAL | Status: DC

## 2012-10-20 NOTE — Patient Instructions (Signed)

## 2012-10-20 NOTE — Assessment & Plan Note (Signed)
Continue with compression hose and leg elevation eRx for HCTZ 25 mg daily, if ineffective, increase to 50 mg after 3 days and call to let me know Recheck  Potassium in 2 weeks

## 2012-10-20 NOTE — Progress Notes (Signed)
Subjective:    Patient ID: Betty Green, female    DOB: [birth year], 67 y.o.   MRN: [id number]  HPI  Pt presents to the clinic today with c/o swelling in her feet. This started about 1 month ago. The swelling is present in the morning and seems to get worse throughout the day. She has bought some compression hose. She does sit with her feet elevated. She reports this has not improved the swelling. She has been treated in the past for peripheral edema with lasix. She reports it did not help all that much. Additionally, she c/o right kne epain. She reports that she has arthritis in this knee. She is on naprosyn but it does not help. She is aware that she needs to lose weight. She reports that she is unable to exercise secondary to the knee pain.   Review of Systems      Past Medical History  Diagnosis Date  . Bronchitis   . Allergy   . Asthma   . Depression   . Hypertension   . Arthritis   . Shoulder pain, bilateral 10/18/2010  . Anxiety 05/31/2011  . Insomnia 05/31/2011  . Other and unspecified hyperlipidemia 08/04/2012    Current Outpatient Prescriptions  Medication Sig Dispense Refill  . albuterol (PROAIR HFA) 108 (90 BASE) MCG/ACT inhaler Inhale 2 puffs into the lungs every 6 (six) hours as needed for wheezing.  1 Inhaler  6  . albuterol (PROVENTIL HFA;VENTOLIN HFA) 108 (90 BASE) MCG/ACT inhaler Inhale 2 puffs into the lungs every 6 (six) hours as needed for wheezing.  1 Inhaler  11  . ALPRAZolam (XANAX) 0.25 MG tablet Take 1 tablet (0.25 mg total) by mouth 2 (two) times daily as needed for sleep.  30 tablet  0  . aspirin EC 81 MG tablet Take 1 tablet (81 mg total) by mouth daily.  150 tablet  2  . HYDROcodone-homatropine (HYCODAN) 5-1.5 MG/5ML syrup Take 5 mLs by mouth every 6 (six) hours as needed for cough.  120 mL  1  . hydrOXYzine (ATARAX/VISTARIL) 25 MG tablet 1-2 tabs by mouth three times per day as needed for itching  90 tablet  2  . levofloxacin (LEVAQUIN) 250 MG tablet Take  1 tablet (250 mg total) by mouth daily.  7 tablet  0  . losartan (COZAAR) 100 MG tablet Take 1 tablet (100 mg total) by mouth daily.  30 tablet  11  . naproxen (NAPROSYN) 500 MG tablet Take 1 tablet (500 mg total) by mouth 2 (two) times daily with a meal.  60 tablet  3  . omeprazole (PRILOSEC) 20 MG capsule Take 1 capsule (20 mg total) by mouth daily.  90 capsule  3   No current facility-administered medications for this visit.    Allergies  Allergen Reactions  . Amoxicillin-Pot Clavulanate     Hives  . Azithromycin Other (See Comments)    abd pain    Family History  Problem Relation Age of Onset  . Arthritis Mother   . Heart disease Mother   . Hypertension Mother   . Arthritis Father   . Heart disease Father   . Hypertension Father   . Arthritis Other   . Cancer Other     colon and prostate cancer    History   Social History  . Marital Status: Single    Spouse Name: N/A    Number of Children: 2  . Years of Education: 12   Occupational History  . retired  [employer]   Social History Main Topics  . Smoking status: Former Smoker -- 0.10 packs/day for 2 years    Types: Cigarettes    Quit date: 02/06/1984  . Smokeless tobacco: Not on file  . Alcohol Use: No  . Drug Use: No  . Sexual Activity: Not on file   Other Topics Concern  . Not on file   Social History Narrative  . No narrative on file     Constitutional: Denies fever, malaise, fatigue, headache or abrupt weight changes.   Cardiovascular: Pt reports swelling in her feet. Denies chest pain, chest tightness, palpitations or swelling in the hands or feet.  Musculoskeletal: Pt reports right knee pain. Denies decrease in range of motion, muscle pain or joint pain and swelling.    No other specific complaints in a complete review of systems (except as listed in HPI above).  Objective:   Physical Exam  BP 132/80  Pulse 111  Temp(Src) 98.8 F (37.1 C) (Oral)  Wt 304 lb 8 oz (138.12 kg)  BMI  53.95 kg/m2  SpO2 95% Wt Readings from Last 3 Encounters:  10/20/12 304 lb 8 oz (138.12 kg)  08/04/12 288 lb 2 oz (130.693 kg)  06/27/12 286 lb 8 oz (129.956 kg)    General: Appears her stated age, obese but well developed, well nourished in NAD. Cardiovascular: Normal rate and rhythm. S1,S2 noted.  No murmur, rubs or gallops noted. No JVD , 2+ nonpitting edema noted of BLE. No carotid bruits noted. Pulmonary/Chest: Normal effort and positive vesicular breath sounds. No respiratory distress. No wheezes, rales or ronchi noted.  Musculoskeletal: Crepitus noted with ROM of knee. Normal gait.   BMET    Component Value Date/Time   NA 138 05/02/2012 1539   K 4.4 05/02/2012 1539   CL 103 05/02/2012 1539   CO2 28 05/02/2012 1539   GLUCOSE 94 05/02/2012 1539   BUN 10 05/02/2012 1539   CREATININE 0.8 05/02/2012 1539   CALCIUM 9.3 05/02/2012 1539    Lipid Panel     Component Value Date/Time   CHOL 205* 05/02/2012 1539   TRIG 100.0 05/02/2012 1539   HDL 56.50 05/02/2012 1539   CHOLHDL 4 05/02/2012 1539   VLDL 20.0 05/02/2012 1539   LDLCALC 107* 10/18/2010 1043    CBC    Component Value Date/Time   WBC 7.5 05/02/2012 1539   RBC 4.22 05/02/2012 1539   HGB 12.3 05/02/2012 1539   HCT 37.1 05/02/2012 1539   PLT 327.0 05/02/2012 1539   MCV 87.9 05/02/2012 1539   MCHC 33.1 05/02/2012 1539   RDW 15.5* 05/02/2012 1539   LYMPHSABS 2.1 05/02/2012 1539   MONOABS 0.3 05/02/2012 1539   EOSABS 0.7 05/02/2012 1539   BASOSABS 0.0 05/02/2012 1539    Hgb A1C No results found for this basename: HGBA1C         Assessment & Plan:

## 2012-11-04 ENCOUNTER — Ambulatory Visit: Payer: Medicare Other | Admitting: Internal Medicine

## 2012-11-11 ENCOUNTER — Encounter: Payer: Self-pay | Admitting: Internal Medicine

## 2012-11-11 ENCOUNTER — Ambulatory Visit (INDEPENDENT_AMBULATORY_CARE_PROVIDER_SITE_OTHER): Payer: Medicare Other | Admitting: Internal Medicine

## 2012-11-11 VITALS — BP 110/62 | HR 101 | Temp 97.7°F | Ht 63.0 in | Wt 293.0 lb

## 2012-11-11 DIAGNOSIS — M1990 Unspecified osteoarthritis, unspecified site: Secondary | ICD-10-CM

## 2012-11-11 DIAGNOSIS — M129 Arthropathy, unspecified: Secondary | ICD-10-CM

## 2012-11-11 DIAGNOSIS — I10 Essential (primary) hypertension: Secondary | ICD-10-CM

## 2012-11-11 DIAGNOSIS — F411 Generalized anxiety disorder: Secondary | ICD-10-CM

## 2012-11-11 DIAGNOSIS — R609 Edema, unspecified: Secondary | ICD-10-CM

## 2012-11-11 DIAGNOSIS — F419 Anxiety disorder, unspecified: Secondary | ICD-10-CM

## 2012-11-11 MED ORDER — LOSARTAN POTASSIUM 100 MG PO TABS: 100.0000 mg | ORAL_TABLET | Freq: Every day | ORAL | Status: DC

## 2012-11-11 NOTE — Assessment & Plan Note (Signed)
stable overall by history and exam, recent data reviewed with pt, and pt to continue medical treatment as before,  to f/u any worsening symptoms or concerns Lab Results  Component Value Date   WBC 7.5 05/02/2012   HGB 12.3 05/02/2012   HCT 37.1 05/02/2012   PLT 327.0 05/02/2012   GLUCOSE 94 05/02/2012   CHOL 205* 05/02/2012   TRIG 100.0 05/02/2012   HDL 56.50 05/02/2012   LDLDIRECT 134.1 05/02/2012   LDLCALC 107* 10/18/2010   ALT 16 05/02/2012   AST 17 05/02/2012   NA 138 05/02/2012   K 4.4 05/02/2012   CL 103 05/02/2012   CREATININE 0.8 05/02/2012   BUN 10 05/02/2012   CO2 28 05/02/2012   TSH 1.02 05/02/2012

## 2012-11-11 NOTE — Assessment & Plan Note (Signed)
Improved with hct, to cont as is

## 2012-11-11 NOTE — Assessment & Plan Note (Signed)
stable overall by history and exam, recent data reviewed with pt, and pt to continue medical treatment as before,  to f/u any worsening symptoms or concerns BP Readings from Last 3 Encounters:  11/11/12 110/62  10/20/12 132/80  08/04/12 112/68

## 2012-11-11 NOTE — Patient Instructions (Addendum)
Please continue all other medications as before There are no other changes today Your lab work from last visit was shared with you today Please continue your efforts at being more active, low cholesterol diet, and weight control.   Please remember to sign up for My Chart if you have not done so, as this will be important to you in the future with finding out test results, communicating by private email, and scheduling acute appointments online when needed.  Please return in 6 months, or sooner if needed

## 2012-11-11 NOTE — Assessment & Plan Note (Signed)
Pt to check on medicatoin for pain name at home, and let us know if needs refill

## 2012-11-11 NOTE — Progress Notes (Signed)
Subjective:    Patient ID: Betty Green, female    DOB: 1945/09/18, 67 y.o.   MRN: [id number]  HPI  Here to f/u; overall doing ok,  Pt denies chest pain, increased sob or doe, wheezing, orthopnea, PND, increased LE swelling, palpitations, dizziness or syncope.  Pt denies polydipsia, polyuria, or low sugar symptoms such as weakness or confusion improved with po intake.  Pt denies new neurological symptoms such as new headache, or facial or extremity weakness or numbness.   Pt states overall good compliance with meds, has been trying to follow lower cholesterol diet, with wt overall stable,  but little exercise however.  Pt continues to have recurring LBP without change in severity, bowel or bladder change, fever, wt loss,  worsening LE pain/numbness/weakness, gait change or falls.  Has ongoing arthritic pain, naproxen helped, tramadol no help.  Saw ortho, as well as PA here and tx with some pain medication but which is not clear.  Works for her for pain but cant remember the name right now. Denies worsening depressive symptoms, suicidal ideation, or panic; has ongoing anxiety, not increased recently. As she has been able to move to a nicer area. Edema resolved with the HCt. Needs refill on losartan to [facility] Past Medical History  Diagnosis Date  . Bronchitis   . Allergy   . Asthma   . Depression   . Hypertension   . Arthritis   . Shoulder pain, bilateral 10/18/2010  . Anxiety 05/31/2011  . Insomnia 05/31/2011  . Other and unspecified hyperlipidemia 08/04/2012   Past Surgical History  Procedure Laterality Date  . Cholecystectomy  1981  . Appendectomy  1966  . Tonsillectomy  1963  . Abdominal hysterectomy  1980  . Bilateral oophorectomy      reports that she quit smoking about 28 years ago. Her smoking use included Cigarettes. She has a .2 pack-year smoking history. She does not have any smokeless tobacco history on file. She reports that she does not drink alcohol or use illicit  drugs. family history includes Arthritis in her father, mother, and other; Cancer in her other; Heart disease in her father and mother; Hypertension in her father and mother. Allergies  Allergen Reactions  . Amoxicillin-Pot Clavulanate     Hives  . Azithromycin Other (See Comments)    abd pain   Current Outpatient Prescriptions on File Prior to Visit  Medication Sig Dispense Refill  . albuterol (PROAIR HFA) 108 (90 BASE) MCG/ACT inhaler Inhale 2 puffs into the lungs every 6 (six) hours as needed for wheezing.  1 Inhaler  6  . albuterol (PROVENTIL HFA;VENTOLIN HFA) 108 (90 BASE) MCG/ACT inhaler Inhale 2 puffs into the lungs every 6 (six) hours as needed for wheezing.  1 Inhaler  11  . aspirin EC 81 MG tablet Take 1 tablet (81 mg total) by mouth daily.  150 tablet  2  . hydrochlorothiazide (HYDRODIURIL) 25 MG tablet Take 1 tablet (25 mg total) by mouth daily.  30 tablet  3  . hydrOXYzine (ATARAX/VISTARIL) 25 MG tablet 1-2 tabs by mouth three times per day as needed for itching  90 tablet  2  . naproxen (NAPROSYN) 500 MG tablet Take 1 tablet (500 mg total) by mouth 2 (two) times daily with a meal.  60 tablet  3  . omeprazole (PRILOSEC) 20 MG capsule Take 1 capsule (20 mg total) by mouth daily.  90 capsule  3   No current facility-administered medications on file prior to visit.  Review of Systems  Constitutional: Negative for unexpected weight change, or unusual diaphoresis  HENT: Negative for tinnitus.   Eyes: Negative for photophobia and visual disturbance.  Respiratory: Negative for choking and stridor.   Gastrointestinal: Negative for vomiting and blood in stool.  Genitourinary: Negative for hematuria and decreased urine volume.  Musculoskeletal: Negative for acute joint swelling Skin: Negative for color change and wound.  Neurological: Negative for tremors and numbness other than noted  Psychiatric/Behavioral: Negative for decreased concentration or  hyperactivity.        Objective:   Physical Exam BP 110/62  Pulse 101  Temp(Src) 97.7 F (36.5 C) (Oral)  Ht 5\' 3"  (1.6 m)  Wt 293 lb (132.904 kg)  BMI 51.92 kg/m2  SpO2 94% VS noted,  Constitutional: Pt appears well-developed and well-nourished. /morbid obese HENT: Head: NCAT.  Right Ear: External ear normal.  Left Ear: External ear normal.  Eyes: Conjunctivae and EOM are normal. Pupils are equal, round, and reactive to light.  Neck: Normal range of motion. Neck supple.  Cardiovascular: Normal rate and regular rhythm.   Pulmonary/Chest: Effort normal and breath sounds normal.  Abd:  Soft, NT, non-distended, + BS Neurological: Pt is alert. Not confused  Skin: Skin is warm. No erythema. No LE edema Psychiatric: Pt behavior is normal. Thought content normal. not depressed affect or overly nervous    Assessment & Plan:

## 2012-11-12 ENCOUNTER — Telehealth: Payer: Self-pay | Admitting: *Deleted

## 2012-11-12 NOTE — Telephone Encounter (Signed)
Can she say who prescribed this and the exact prescription, since this has not been prescribed for her by or in [facility] in the past

## 2012-11-12 NOTE — Telephone Encounter (Signed)
Pt called to give MD name of medication he is currently taking:  Hydroco/acetamin 10/325mg .  Requesting refill, please advise

## 2012-11-13 NOTE — Telephone Encounter (Signed)
Left message for pt to return call.

## 2012-12-08 ENCOUNTER — Telehealth: Payer: Self-pay | Admitting: *Deleted

## 2012-12-08 NOTE — Telephone Encounter (Signed)
Pt called states Hydrocodone/Acetaminophen 10/325mg  1/2-1 tab every 8 hours PRN for pain; it was prescribed by Dr Pati Gallo.  Please advise

## 2012-12-08 NOTE — Telephone Encounter (Signed)
Very sorry, but I no longer prescribe the Schedule II medications for long term pain management, due to the recent changed law and regulation

## 2012-12-09 NOTE — Telephone Encounter (Signed)
Patient informed of MD instructions on pain medication.

## 2012-12-09 NOTE — Telephone Encounter (Signed)
Unable to contact pt, no VM. 

## 2013-02-24 ENCOUNTER — Telehealth: Payer: Self-pay | Admitting: Internal Medicine

## 2013-02-24 MED ORDER — ONDANSETRON HCL 4 MG PO TABS: 4.0000 mg | ORAL_TABLET | Freq: Three times a day (TID) | ORAL | Status: DC | PRN

## 2013-02-24 NOTE — Telephone Encounter (Signed)
Pt request call from the assistant concern about stomach pain and throwing up. Pt stated he can not come in for an appt due to these symtoms, pt request Dr. Jenny Reichmann to give him something. Please advise

## 2013-02-24 NOTE — Telephone Encounter (Signed)
Notified pt with md response.../lmb 

## 2013-02-24 NOTE — Telephone Encounter (Signed)
Ok but would need to call 911 for going to ER for persistent n/v, fever, abd pain, dizziness or falls or other unusual symptoms

## 2013-03-11 ENCOUNTER — Other Ambulatory Visit: Payer: Self-pay | Admitting: Internal Medicine

## 2013-03-11 DIAGNOSIS — Z1231 Encounter for screening mammogram for malignant neoplasm of breast: Secondary | ICD-10-CM

## 2013-03-18 ENCOUNTER — Ambulatory Visit (HOSPITAL_COMMUNITY): Payer: Medicare Other

## 2013-03-25 ENCOUNTER — Ambulatory Visit (HOSPITAL_COMMUNITY): Payer: Medicare Other

## 2013-03-31 ENCOUNTER — Ambulatory Visit (HOSPITAL_COMMUNITY): Payer: Medicare Other | Attending: Internal Medicine

## 2013-07-16 ENCOUNTER — Encounter: Payer: Self-pay | Admitting: Internal Medicine

## 2013-07-16 ENCOUNTER — Other Ambulatory Visit (INDEPENDENT_AMBULATORY_CARE_PROVIDER_SITE_OTHER): Payer: Medicare Other

## 2013-07-16 ENCOUNTER — Ambulatory Visit (INDEPENDENT_AMBULATORY_CARE_PROVIDER_SITE_OTHER): Payer: Medicare Other | Admitting: Internal Medicine

## 2013-07-16 VITALS — BP 130/82 | HR 111 | Temp 98.2°F | Ht 63.0 in | Wt 282.0 lb

## 2013-07-16 DIAGNOSIS — Z23 Encounter for immunization: Secondary | ICD-10-CM

## 2013-07-16 DIAGNOSIS — Z0000 Encounter for general adult medical examination without abnormal findings: Secondary | ICD-10-CM

## 2013-07-16 DIAGNOSIS — J45909 Unspecified asthma, uncomplicated: Secondary | ICD-10-CM

## 2013-07-16 DIAGNOSIS — I10 Essential (primary) hypertension: Secondary | ICD-10-CM

## 2013-07-16 LAB — HEPATIC FUNCTION PANEL
ALBUMIN: 3.7 g/dL (ref 3.5–5.2)
ALT: 20 U/L (ref 0–35)
AST: 24 U/L (ref 0–37)
Alkaline Phosphatase: 78 U/L (ref 39–117)
Bilirubin, Direct: 0.1 mg/dL (ref 0.0–0.3)
TOTAL PROTEIN: 8.4 g/dL — AB (ref 6.0–8.3)
Total Bilirubin: 0.4 mg/dL (ref 0.2–1.2)

## 2013-07-16 LAB — LIPID PANEL
CHOL/HDL RATIO: 3
Cholesterol: 189 mg/dL (ref 0–200)
HDL: 54.3 mg/dL (ref >39.00)
LDL CALC: 112 mg/dL — AB (ref 0–99)
NonHDL: 134.7
TRIGLYCERIDES: 112 mg/dL (ref 0.0–149.0)
VLDL: 22.4 mg/dL (ref 0.0–40.0)

## 2013-07-16 LAB — BASIC METABOLIC PANEL
BUN: 10 mg/dL (ref 6–23)
CALCIUM: 9.7 mg/dL (ref 8.4–10.5)
CO2: 28 meq/L (ref 19–32)
CREATININE: 1 mg/dL (ref 0.4–1.2)
Chloride: 103 mEq/L (ref 96–112)
GFR: 73.2 mL/min (ref >60.00)
Glucose, Bld: 117 mg/dL — ABNORMAL HIGH (ref 70–99)
Potassium: 4.1 mEq/L (ref 3.5–5.1)
Sodium: 138 mEq/L (ref 135–145)

## 2013-07-16 LAB — CBC WITH DIFFERENTIAL/PLATELET
BASOS PCT: 0.9 % (ref 0.0–3.0)
Basophils Absolute: 0.1 10*3/uL (ref 0.0–0.1)
EOS PCT: 3.8 % (ref 0.0–5.0)
Eosinophils Absolute: 0.3 10*3/uL (ref 0.0–0.7)
HCT: 36 % (ref 36.0–46.0)
HEMOGLOBIN: 12.1 g/dL (ref 12.0–15.0)
LYMPHS PCT: 28.1 % (ref 12.0–46.0)
Lymphs Abs: 1.9 10*3/uL (ref 0.7–4.0)
MCHC: 33.5 g/dL (ref 30.0–36.0)
MCV: 87.3 fl (ref 78.0–100.0)
MONOS PCT: 7.9 % (ref 3.0–12.0)
Monocytes Absolute: 0.5 10*3/uL (ref 0.1–1.0)
NEUTROS ABS: 4 10*3/uL (ref 1.4–7.7)
Neutrophils Relative %: 59.3 % (ref 43.0–77.0)
Platelets: 330 10*3/uL (ref 150.0–400.0)
RBC: 4.13 Mil/uL (ref 3.87–5.11)
RDW: 15.9 % — ABNORMAL HIGH (ref 11.5–15.5)
WBC: 6.8 10*3/uL (ref 4.0–10.5)

## 2013-07-16 LAB — TSH: TSH: 1.36 u[IU]/mL (ref 0.35–4.50)

## 2013-07-16 MED ORDER — ALBUTEROL SULFATE HFA 108 (90 BASE) MCG/ACT IN AERS: 2.0000 | INHALATION_SPRAY | Freq: Four times a day (QID) | RESPIRATORY_TRACT | Status: DC | PRN

## 2013-07-16 MED ORDER — OMEPRAZOLE 20 MG PO CPDR: 20.0000 mg | DELAYED_RELEASE_CAPSULE | Freq: Every day | ORAL | Status: DC

## 2013-07-16 MED ORDER — NAPROXEN 500 MG PO TABS: 500.0000 mg | ORAL_TABLET | Freq: Two times a day (BID) | ORAL | Status: DC

## 2013-07-16 MED ORDER — BUDESONIDE-FORMOTEROL FUMARATE 160-4.5 MCG/ACT IN AERO: 2.0000 | INHALATION_SPRAY | Freq: Two times a day (BID) | RESPIRATORY_TRACT | Status: DC

## 2013-07-16 MED ORDER — ONDANSETRON HCL 4 MG PO TABS: 4.0000 mg | ORAL_TABLET | Freq: Three times a day (TID) | ORAL | Status: DC | PRN

## 2013-07-16 MED ORDER — HYDROXYZINE HCL 25 MG PO TABS: ORAL_TABLET | ORAL | Status: DC

## 2013-07-16 NOTE — Addendum Note (Signed)
Addended by: Sharon Seller B on: 07/16/2013 11:53 AM   Modules accepted: Orders

## 2013-07-16 NOTE — Patient Instructions (Addendum)
You had the new Prevnar pneumonia shot today  Please take all new medication as prescribed - the symbicort at 2 puffs twice per day (starting with the sample)  Please continue all other medications as before, and refills have been done if requested.  Please have the pharmacy call with any other refills you may need.  Please continue your efforts at being more active, low cholesterol diet, and weight control.  You are otherwise up to date with prevention measures today.  Please keep your appointments with your specialists as you may have planned  Please remember to followup with your mammogram - to call [facility]  Please call when you are ready to have the colonoscopy  To Log into My Chart online, please go by [facility] or [employer] to [email or web address], or download the MyChart App from the [employer]. Your Username is: arm (pass taurus)  Please go to the LAB in the Basement (turn left off the elevator) for the tests to be done today  You will be contacted by phone if any changes need to be made immediately.  Otherwise, you will receive a letter about your results with an explanation, but please check with MyChart first.  Please remember to sign up for MyChart if you have not done so, as this will be important to you in the future with finding out test results, communicating by private email, and scheduling acute appointments online when needed.  Please return in 6 months, or sooner if needed

## 2013-07-16 NOTE — Assessment & Plan Note (Signed)
Re-start proair hfa prn, also add symbicort 160/4.5 asd

## 2013-07-16 NOTE — Progress Notes (Signed)
Subjective:    Patient ID: Betty Green, female    DOB: April 17, 1945, 68 y.o.   MRN: [id number]  HPI Here for wellness and f/u;  Overall doing ok;  Pt denies CP, worsening SOB, DOE, wheezing, orthopnea, PND, worsening LE edema, palpitations, dizziness or syncope, except for worsening wheeze.sob out of proair.  Pt denies neurological change such as new headache, facial or extremity weakness.  Pt denies polydipsia, polyuria, or low sugar symptoms. Pt states overall good compliance with treatment and medications, good tolerability, and has been trying to follow lower cholesterol diet.  Pt denies worsening depressive symptoms, suicidal ideation or panic. No fever, night sweats, wt loss, loss of appetite, or other constitutional symptoms.  Pt states good ability with ADL's, has low fall risk, home safety reviewed and adequate, no other significant changes in hearing or vision, and only occasionally active with exercise. Due for mammogram - pt plans to call.  Declines colonscopy fo rnow, will call back later this yr.due for prevnar. No longer sees Dr Annamaria Boots due to copay $50 cost Past Medical History  Diagnosis Date  . Bronchitis   . Allergy   . Asthma   . Depression   . Hypertension   . Arthritis   . Shoulder pain, bilateral 10/18/2010  . Anxiety 05/31/2011  . Insomnia 05/31/2011  . Other and unspecified hyperlipidemia 08/04/2012   Past Surgical History  Procedure Laterality Date  . Cholecystectomy  1981  . Appendectomy  1966  . Tonsillectomy  1963  . Abdominal hysterectomy  1980  . Bilateral oophorectomy      reports that she quit smoking about 29 years ago. Her smoking use included Cigarettes. She has a .2 pack-year smoking history. She does not have any smokeless tobacco history on file. She reports that she does not drink alcohol or use illicit drugs. family history includes Arthritis in her father, mother, and other; Cancer in her other; Heart disease in her father and mother; Hypertension in  her father and mother. Allergies  Allergen Reactions  . Amoxicillin-Pot Clavulanate     Hives  . Azithromycin Other (See Comments)    abd pain   Current Outpatient Prescriptions on File Prior to Visit  Medication Sig Dispense Refill  . albuterol (PROAIR HFA) 108 (90 BASE) MCG/ACT inhaler Inhale 2 puffs into the lungs every 6 (six) hours as needed for wheezing.  1 Inhaler  6  . albuterol (PROVENTIL HFA;VENTOLIN HFA) 108 (90 BASE) MCG/ACT inhaler Inhale 2 puffs into the lungs every 6 (six) hours as needed for wheezing.  1 Inhaler  11  . aspirin EC 81 MG tablet Take 1 tablet (81 mg total) by mouth daily.  150 tablet  2  . hydrochlorothiazide (HYDRODIURIL) 25 MG tablet Take 1 tablet (25 mg total) by mouth daily.  30 tablet  3  . losartan (COZAAR) 100 MG tablet Take 1 tablet (100 mg total) by mouth daily.  30 tablet  11   No current facility-administered medications on file prior to visit.    Review of Systems Constitutional: Negative for increased diaphoresis, other activity, appetite or other siginficant weight change  HENT: Negative for worsening hearing loss, ear pain, facial swelling, mouth sores and neck stiffness.   Eyes: Negative for other worsening pain, redness or visual disturbance.  Respiratory: Negative for shortness of breath and wheezing.   Cardiovascular: Negative for chest pain and palpitations.  Gastrointestinal: Negative for diarrhea, blood in stool, abdominal distention or other pain Genitourinary: Negative for hematuria, flank pain  or change in urine volume.  Musculoskeletal: Negative for myalgias or other joint complaints.  Skin: Negative for color change and wound.  Neurological: Negative for syncope and numbness. other than noted Hematological: Negative for adenopathy. or other swelling Psychiatric/Behavioral: Negative for hallucinations, self-injury, decreased concentration or other worsening agitation.      Objective:   Physical Exam BP 130/82  Pulse 111   Temp(Src) 98.2 F (36.8 C) (Oral)  Ht 5\' 3"  (1.6 m)  Wt 282 lb (127.914 kg)  BMI 49.97 kg/m2  SpO2 96% VS noted,  Constitutional: Pt is oriented to person, place, and time. Appears well-developed and well-nourished.  Head: Normocephalic and atraumatic.  Right Ear: External ear normal.  Left Ear: External ear normal.  Nose: Nose normal.  Mouth/Throat: Oropharynx is clear and moist.  Eyes: Conjunctivae and EOM are normal. Pupils are equal, round, and reactive to light.  Neck: Normal range of motion. Neck supple. No JVD present. No tracheal deviation present.  Cardiovascular: Normal rate, regular rhythm, normal heart sounds and intact distal pulses.   Pulmonary/Chest: Effort normal and breath sounds with no rales but trace wheezing  Abdominal: Soft. Bowel sounds are normal. NT. No HSM  Musculoskeletal: Normal range of motion. Exhibits no edema.  Lymphadenopathy:  Has no cervical adenopathy.  Neurological: Pt is alert and oriented to person, place, and time. Pt has normal reflexes. No cranial nerve deficit. Motor grossly intact Skin: Skin is warm and dry. No rash noted.  Psychiatric:  Has normal mood and affect. Behavior is normal.     Assessment & Plan:

## 2013-07-16 NOTE — Assessment & Plan Note (Signed)

## 2013-07-22 ENCOUNTER — Ambulatory Visit: Payer: Medicare Other

## 2013-07-22 DIAGNOSIS — Z0000 Encounter for general adult medical examination without abnormal findings: Secondary | ICD-10-CM

## 2013-07-22 LAB — HEMOGLOBIN A1C: Hgb A1c MFr Bld: 5.5 % (ref 4.6–6.5)

## 2013-11-24 ENCOUNTER — Ambulatory Visit (HOSPITAL_COMMUNITY)
Admission: RE | Admit: 2013-11-24 | Discharge: 2013-11-24 | Disposition: A | Discharge status: Home / Self Care | Payer: Medicare Other | Source: Ambulatory Visit | Attending: Internal Medicine | Admitting: Internal Medicine

## 2013-11-24 DIAGNOSIS — Z1231 Encounter for screening mammogram for malignant neoplasm of breast: Secondary | ICD-10-CM | POA: Insufficient documentation

## 2013-12-07 ENCOUNTER — Other Ambulatory Visit: Payer: Self-pay | Admitting: Internal Medicine

## 2013-12-15 ENCOUNTER — Encounter: Payer: Self-pay | Admitting: Internal Medicine

## 2013-12-15 MED ORDER — HYDROCODONE-HOMATROPINE 5-1.5 MG/5ML PO SYRP: 5.0000 mL | ORAL_SOLUTION | Freq: Four times a day (QID) | ORAL | Status: DC | PRN

## 2013-12-15 NOTE — Telephone Encounter (Signed)
Ok this time

## 2013-12-15 NOTE — Telephone Encounter (Signed)
Called the patient informed to pickup hardcopy for cough medication at the front desk.

## 2014-01-28 ENCOUNTER — Emergency Department (HOSPITAL_COMMUNITY): Payer: Medicare Other

## 2014-01-28 ENCOUNTER — Encounter (HOSPITAL_COMMUNITY): Payer: Self-pay | Admitting: Emergency Medicine

## 2014-01-28 ENCOUNTER — Emergency Department (HOSPITAL_COMMUNITY)
Admission: EM | Admit: 2014-01-28 | Discharge: 2014-01-28 | Disposition: A | Discharge status: Home / Self Care | Payer: Medicare Other | Attending: Emergency Medicine | Admitting: Emergency Medicine

## 2014-01-28 DIAGNOSIS — S82831A Other fracture of upper and lower end of right fibula, initial encounter for closed fracture: Secondary | ICD-10-CM | POA: Diagnosis not present

## 2014-01-28 DIAGNOSIS — Z87891 Personal history of nicotine dependence: Secondary | ICD-10-CM | POA: Insufficient documentation

## 2014-01-28 DIAGNOSIS — W010XXA Fall on same level from slipping, tripping and stumbling without subsequent striking against object, initial encounter: Secondary | ICD-10-CM | POA: Insufficient documentation

## 2014-01-28 DIAGNOSIS — Z8639 Personal history of other endocrine, nutritional and metabolic disease: Secondary | ICD-10-CM | POA: Insufficient documentation

## 2014-01-28 DIAGNOSIS — Z791 Long term (current) use of non-steroidal anti-inflammatories (NSAID): Secondary | ICD-10-CM | POA: Insufficient documentation

## 2014-01-28 DIAGNOSIS — Y9289 Other specified places as the place of occurrence of the external cause: Secondary | ICD-10-CM | POA: Insufficient documentation

## 2014-01-28 DIAGNOSIS — Y9389 Activity, other specified: Secondary | ICD-10-CM | POA: Insufficient documentation

## 2014-01-28 DIAGNOSIS — Z79899 Other long term (current) drug therapy: Secondary | ICD-10-CM | POA: Insufficient documentation

## 2014-01-28 DIAGNOSIS — J45909 Unspecified asthma, uncomplicated: Secondary | ICD-10-CM | POA: Insufficient documentation

## 2014-01-28 DIAGNOSIS — M1990 Unspecified osteoarthritis, unspecified site: Secondary | ICD-10-CM | POA: Diagnosis not present

## 2014-01-28 DIAGNOSIS — I10 Essential (primary) hypertension: Secondary | ICD-10-CM | POA: Insufficient documentation

## 2014-01-28 DIAGNOSIS — Z8669 Personal history of other diseases of the nervous system and sense organs: Secondary | ICD-10-CM | POA: Diagnosis not present

## 2014-01-28 DIAGNOSIS — S99911A Unspecified injury of right ankle, initial encounter: Secondary | ICD-10-CM | POA: Diagnosis present

## 2014-01-28 DIAGNOSIS — Y998 Other external cause status: Secondary | ICD-10-CM | POA: Insufficient documentation

## 2014-01-28 DIAGNOSIS — Z7951 Long term (current) use of inhaled steroids: Secondary | ICD-10-CM | POA: Insufficient documentation

## 2014-01-28 DIAGNOSIS — S82891A Other fracture of right lower leg, initial encounter for closed fracture: Secondary | ICD-10-CM

## 2014-01-28 DIAGNOSIS — F419 Anxiety disorder, unspecified: Secondary | ICD-10-CM | POA: Insufficient documentation

## 2014-01-28 DIAGNOSIS — F329 Major depressive disorder, single episode, unspecified: Secondary | ICD-10-CM | POA: Insufficient documentation

## 2014-01-28 DIAGNOSIS — Z7982 Long term (current) use of aspirin: Secondary | ICD-10-CM | POA: Diagnosis not present

## 2014-01-28 MED ORDER — TRAMADOL HCL 50 MG PO TABS: 50.0000 mg | ORAL_TABLET | Freq: Four times a day (QID) | ORAL | Status: DC | PRN

## 2014-01-28 NOTE — ED Provider Notes (Signed)
CSN: [id number]     Arrival date & time 01/28/14  1324 History  This chart was scribed for non-physician practitioner working with Carmin Muskrat, MD, by Peyton Bottoms ED Scribe. This patient was seen in room WTR7/WTR7 and the patient's care was started at 6:49 PM  Chief Complaint  Patient presents with  . Ankle Pain  . Fall   Patient is a 68 y.o. female presenting with ankle pain and fall. The history is provided by the patient. No language interpreter was used.  Ankle Pain Associated symptoms: no fever   Fall Pertinent negatives include no chest pain and no headaches.   HPI Comments: Betty ACHEY is a 68 y.o. female with a past medical history of bronchitis, asthma, hypertension, arthritis, anxiety and insomnia, who presents to the Emergency Department complaining of right ankle pain that began earlier today when patient slipped on wet muddy grass and had a fall. She denies head trauma or LOC. She states that she landed with her right leg underneath her.   Past Medical History  Diagnosis Date  . Bronchitis   . Allergy   . Asthma   . Depression   . Hypertension   . Arthritis   . Shoulder pain, bilateral 10/18/2010  . Anxiety 05/31/2011  . Insomnia 05/31/2011  . Other and unspecified hyperlipidemia 08/04/2012   Past Surgical History  Procedure Laterality Date  . Cholecystectomy  1981  . Appendectomy  1966  . Tonsillectomy  1963  . Abdominal hysterectomy  1980  . Bilateral oophorectomy     Family History  Problem Relation Age of Onset  . Arthritis Mother   . Heart disease Mother   . Hypertension Mother   . Arthritis Father   . Heart disease Father   . Hypertension Father   . Arthritis Other   . Cancer Other     colon and prostate cancer   History  Substance Use Topics  . Smoking status: Former Smoker -- 0.10 packs/day for 2 years    Types: Cigarettes    Quit date: 02/06/1984  . Smokeless tobacco: Not on file  . Alcohol Use: No   OB History    No data  available     Review of Systems  Constitutional: Negative for fever and chills.  HENT: Negative for rhinorrhea and sore throat.   Respiratory: Negative for cough.   Cardiovascular: Negative for chest pain.  Gastrointestinal: Negative for nausea, vomiting and diarrhea.  Genitourinary: Negative for dysuria.  Musculoskeletal: Positive for joint swelling (right ankle pain) and arthralgias.  Skin: Negative for rash.  Neurological: Negative for headaches.  Psychiatric/Behavioral: Negative for confusion.   Allergies  Amoxicillin-pot clavulanate and Azithromycin  Home Medications   Prior to Admission medications   Medication Sig Start Date End Date Taking? Authorizing Provider  albuterol (PROAIR HFA) 108 (90 BASE) MCG/ACT inhaler Inhale 2 puffs into the lungs every 6 (six) hours as needed for wheezing. 07/16/13  Yes Biagio Borg, MD  aspirin EC 81 MG tablet Take 1 tablet (81 mg total) by mouth daily. 10/18/10 10/18/15 Yes Biagio Borg, MD  budesonide-formoterol [facility]) 160-4.5 MCG/ACT inhaler Inhale 2 puffs into the lungs 2 (two) times daily. 07/16/13  Yes Biagio Borg, MD  hydrochlorothiazide (HYDRODIURIL) 25 MG tablet Take 1 tablet (25 mg total) by mouth daily. 10/20/12  Yes Jearld Fenton, NP  hydrOXYzine (ATARAX/VISTARIL) 25 MG tablet 1-2 tabs by mouth three times per day as needed for itching 07/16/13  Yes Biagio Borg, MD  losartan (COZAAR) 100 MG tablet TAKE ONE TABLET BY MOUTH ONCE DAILY 12/07/13  Yes Biagio Borg, MD  naproxen (NAPROSYN) 500 MG tablet Take 1 tablet (500 mg total) by mouth 2 (two) times daily with a meal. 07/16/13  Yes Biagio Borg, MD  omeprazole (PRILOSEC) 20 MG capsule Take 1 capsule (20 mg total) by mouth daily. 07/16/13  Yes Biagio Borg, MD  HYDROcodone-homatropine [facility]) 5-1.5 MG/5ML syrup Take 5 mLs by mouth every 6 (six) hours as needed for cough. Patient not taking: Reported on 01/28/2014 12/15/13   Biagio Borg, MD  losartan (COZAAR) 100 MG tablet Take 1  tablet (100 mg total) by mouth daily. 11/11/12 11/11/13  Biagio Borg, MD  ondansetron (ZOFRAN) 4 MG tablet Take 1 tablet (4 mg total) by mouth every 8 (eight) hours as needed for nausea or vomiting. Patient not taking: Reported on 01/28/2014 07/16/13   Biagio Borg, MD  traMADol (ULTRAM) 50 MG tablet Take 1 tablet (50 mg total) by mouth every 6 (six) hours as needed. 01/28/14   Margarita Mail, PA-C   Triage Vitals: BP 158/82 mmHg  Pulse 107  Temp(Src) 97.6 F (36.4 C) (Oral)  Resp 20  SpO2 95%  Physical Exam  Constitutional: She is oriented to person, place, and time. She appears well-developed and well-nourished. No distress.  HENT:  Head: Normocephalic and atraumatic.  Eyes: Conjunctivae and EOM are normal.  Neck: Neck supple. No tracheal deviation present.  Cardiovascular: Normal rate.   Pulmonary/Chest: Effort normal. No respiratory distress.  Musculoskeletal: Normal range of motion. She exhibits edema.  Swelling of lateral surface of right ankle.  Neurological: She is alert and oriented to person, place, and time.  Skin: Skin is warm and dry.  Psychiatric: She has a normal mood and affect. Her behavior is normal.  Nursing note and vitals reviewed.  ED Course  Procedures (including critical care time)  DIAGNOSTIC STUDIES: Oxygen Saturation is 95% on RA, normal by my interpretation.    COORDINATION OF CARE: 7:01 PM- Ordered diagnostic imaging of right ankle. Discussed imaging results with patient. Discussed options for non weight bearing treatment options. Pt advised of plan for treatment and pt agrees.  Labs Review Labs Reviewed - No data to display  Imaging Review No results found.   EKG Interpretation None     MDM   Final diagnoses:  Ankle fracture, right, closed, initial encounter    Patient placed in cam walker. Given crtuches. NWB until eval with orhto.  Pain meds given. Patient seen in shared visit with attending physician.   I personally performed the  services described in this documentation, which was scribed in my presence. The recorded information has been reviewed and is accurate.  Margarita Mail, PA-C 02/09/14 [place], MD 02/09/14 [phone]

## 2014-01-28 NOTE — Discharge Instructions (Signed)
Ankle Fracture  A fracture is a break in a bone. The ankle joint is made up of three bones. These include the lower (distal)sections of your lower leg bones, called the tibia and fibula, along with a bone in your foot, called the talus. Depending on how bad the break is and if more than one ankle joint bone is broken, a cast or splint is used to protect and keep your injured bone from moving while it heals. Sometimes, surgery is required to help the fracture heal properly.   There are two general types of fractures:   Stable fracture. This includes a single fracture line through one bone, with no injury to ankle ligaments. A fracture of the talus that does not have any displacement (movement of the bone on either side of the fracture line) is also stable.   Unstable fracture. This includes more than one fracture line through one or more bones in the ankle joint. It also includes fractures that have displacement of the bone on either side of the fracture line.  CAUSES   A direct blow to the ankle.    Quickly and severely twisting your ankle.   Trauma, such as a car accident or falling from a significant height.  RISK FACTORS  You may be at a higher risk of ankle fracture if:   You have certain medical conditions.   You are involved in high-impact sports.   You are involved in a high-impact car accident.  SIGNS AND SYMPTOMS    Tender and swollen ankle.   Bruising around the injured ankle.   Pain on movement of the ankle.   Difficulty walking or putting weight on the ankle.   A cold foot below the site of the ankle injury. This can occur if the blood vessels passing through your injured ankle were also damaged.   Numbness in the foot below the site of the ankle injury.  DIAGNOSIS   An ankle fracture is usually diagnosed with a physical exam and X-rays. A CT scan may also be required for complex fractures.  TREATMENT   Stable fractures are treated with a cast or splint and using crutches to avoid putting  weight on your injured ankle. This is followed by an ankle strengthening program. Some patients require a special type of cast, depending on other medical problems they may have. Unstable fractures require surgery to ensure the bones heal properly. Your health care provider will tell you what type of fracture you have and the best treatment for your condition.  HOME CARE INSTRUCTIONS    Review correct crutch use with your health care provider and use your crutches as directed. Safe use of crutches is extremely important. Misuse of crutches can cause you to fall or cause injury to nerves in your hands or armpits.   Do not put weight or pressure on the injured ankle until directed by your health care provider.   To lessen the swelling, keep the injured leg elevated while sitting or lying down.   Apply ice to the injured area:   Put ice in a plastic bag.   Place a towel between your cast and the bag.   Leave the ice on for 20 minutes, 2-3 times a day.   If you have a plaster or fiberglass cast:   Do not try to scratch the skin under the cast with any objects. This can increase your risk of skin infection.   Check the skin around the cast every day. You   may put lotion on any red or sore areas.   Keep your cast dry and clean.   If you have a plaster splint:   Wear the splint as directed.   You may loosen the elastic around the splint if your toes become numb, tingle, or turn cold or blue.   Do not put pressure on any part of your cast or splint; it may break. Rest your cast only on a pillow the first 24 hours until it is fully hardened.   Your cast or splint can be protected during bathing with a plastic bag sealed to your skin with medical tape. Do not lower the cast or splint into water.   Take medicines as directed by your health care provider. Only take over-the-counter or prescription medicines for pain, discomfort, or fever as directed by your health care provider.   Do not drive a vehicle until  your health care provider specifically tells you it is safe to do so.   If your health care provider has given you a follow-up appointment, it is very important to keep that appointment. Not keeping the appointment could result in a chronic or permanent injury, pain, and disability. If you have any problem keeping the appointment, call the facility for assistance.  SEEK MEDICAL CARE IF:  You develop increased swelling or discomfort.  SEEK IMMEDIATE MEDICAL CARE IF:    Your cast gets damaged or breaks.   You have continued severe pain.   You develop new pain or swelling after the cast was put on.   Your skin or toenails below the injury turn blue or gray.   Your skin or toenails below the injury feel cold, numb, or have loss of sensitivity to touch.   There is a bad smell or pus draining from under the cast.  MAKE SURE YOU:    Understand these instructions.   Will watch your condition.   Will get help right away if you are not doing well or get worse.  Document Released: 01/20/2000 Document Revised: 01/27/2013 Document Reviewed: 08/21/2012  [employer] Patient Information 2015 [employer]. This information is not intended to replace advice given to you by your health care provider. Make sure you discuss any questions you have with your health care provider.

## 2014-01-28 NOTE — ED Notes (Signed)
Pt was outside and slipped on wet, muddy grass.  Pt landed with right leg up underneath her and c/o right ankle pain.

## 2014-02-10 DIAGNOSIS — S82831D Other fracture of upper and lower end of right fibula, subsequent encounter for closed fracture with routine healing: Secondary | ICD-10-CM | POA: Diagnosis not present

## 2014-02-10 DIAGNOSIS — M25571 Pain in right ankle and joints of right foot: Secondary | ICD-10-CM | POA: Diagnosis not present

## 2014-02-19 DIAGNOSIS — S8261XD Displaced fracture of lateral malleolus of right fibula, subsequent encounter for closed fracture with routine healing: Secondary | ICD-10-CM | POA: Diagnosis not present

## 2014-03-04 DIAGNOSIS — H2513 Age-related nuclear cataract, bilateral: Secondary | ICD-10-CM | POA: Diagnosis not present

## 2014-03-04 DIAGNOSIS — I10 Essential (primary) hypertension: Secondary | ICD-10-CM | POA: Diagnosis not present

## 2014-03-04 DIAGNOSIS — H43811 Vitreous degeneration, right eye: Secondary | ICD-10-CM | POA: Diagnosis not present

## 2014-03-19 DIAGNOSIS — S8261XD Displaced fracture of lateral malleolus of right fibula, subsequent encounter for closed fracture with routine healing: Secondary | ICD-10-CM | POA: Diagnosis not present

## 2014-03-24 ENCOUNTER — Encounter: Payer: Self-pay | Admitting: Internal Medicine

## 2014-03-24 ENCOUNTER — Ambulatory Visit (INDEPENDENT_AMBULATORY_CARE_PROVIDER_SITE_OTHER): Payer: Medicare Other | Admitting: Internal Medicine

## 2014-03-24 VITALS — BP 130/90 | HR 106 | Temp 98.6°F | Wt 267.0 lb

## 2014-03-24 DIAGNOSIS — J4520 Mild intermittent asthma, uncomplicated: Secondary | ICD-10-CM | POA: Diagnosis not present

## 2014-03-24 DIAGNOSIS — J209 Acute bronchitis, unspecified: Secondary | ICD-10-CM

## 2014-03-24 DIAGNOSIS — I10 Essential (primary) hypertension: Secondary | ICD-10-CM | POA: Diagnosis not present

## 2014-03-24 DIAGNOSIS — Z23 Encounter for immunization: Secondary | ICD-10-CM | POA: Diagnosis not present

## 2014-03-24 MED ORDER — LEVOFLOXACIN 250 MG PO TABS
250.0000 mg | ORAL_TABLET | Freq: Every day | ORAL | Status: DC
Start: 2014-03-24 — End: 2014-08-24

## 2014-03-24 MED ORDER — HYDROCODONE-HOMATROPINE 5-1.5 MG/5ML PO SYRP: 5.0000 mL | ORAL_SOLUTION | Freq: Four times a day (QID) | ORAL | Status: DC | PRN

## 2014-03-24 MED ORDER — LORCASERIN HCL 10 MG PO TABS: 10.0000 mg | ORAL_TABLET | Freq: Two times a day (BID) | ORAL | Status: DC

## 2014-03-24 NOTE — Assessment & Plan Note (Signed)
Mild to mod, for antibx course,  to f/u any worsening symptoms or concerns 

## 2014-03-24 NOTE — Progress Notes (Signed)
Pre visit review using our clinic review tool, if applicable. No additional management support is needed unless otherwise documented below in the visit note. 

## 2014-03-24 NOTE — Patient Instructions (Signed)
Please take all new medication as prescribed - the antibiotic, and the cough medicine  Please take all new medication as prescribed - the 15 day free Belviq, and then the longer prescriptoin refills after  Please continue all other medications as before, and refills have been done if requested.  Please have the pharmacy call with any other refills you may need.  Please keep your appointments with your specialists as you may have planned

## 2014-03-24 NOTE — Progress Notes (Signed)
Subjective:    Patient ID: Betty Green, female    DOB: 1945/12/21, 69 y.o.   MRN: [id number]  HPI  Here with acute onset mild to mod 2 wks ST, HA, general weakness and malaise, with prod cough greenish sputum, but Pt denies chest pain, increased sob or doe, wheezing, orthopnea, PND, increased LE swelling, palpitations, dizziness or syncope.  Pt denies new neurological symptoms such as new headache, or facial or extremity weakness or numbness   Pt denies polydipsia, polyuria, or low sugar symptoms such as weakness or confusion improved with po intake.  Pt states overall good compliance with meds, trying to follow lower cholesterol, diabetic diet, wt overall stable but little exercise however.  Asks for belviq to help with wt loss. Due for flu shot Past Medical History  Diagnosis Date  . Bronchitis   . Allergy   . Asthma   . Depression   . Hypertension   . Arthritis   . Shoulder pain, bilateral 10/18/2010  . Anxiety 05/31/2011  . Insomnia 05/31/2011  . Other and unspecified hyperlipidemia 08/04/2012   Past Surgical History  Procedure Laterality Date  . Cholecystectomy  1981  . Appendectomy  1966  . Tonsillectomy  1963  . Abdominal hysterectomy  1980  . Bilateral oophorectomy      reports that she quit smoking about 30 years ago. Her smoking use included Cigarettes. She has a .2 pack-year smoking history. She does not have any smokeless tobacco history on file. She reports that she does not drink alcohol or use illicit drugs. family history includes Arthritis in her father, mother, and other; Cancer in her other; Heart disease in her father and mother; Hypertension in her father and mother. Allergies  Allergen Reactions  . Amoxicillin-Pot Clavulanate     Hives  . Azithromycin Other (See Comments)    abd pain   Current Outpatient Prescriptions on File Prior to Visit  Medication Sig Dispense Refill  . albuterol (PROAIR HFA) 108 (90 BASE) MCG/ACT inhaler Inhale 2 puffs into the lungs  every 6 (six) hours as needed for wheezing. 1 Inhaler 11  . aspirin EC 81 MG tablet Take 1 tablet (81 mg total) by mouth daily. 150 tablet 2  . budesonide-formoterol (SYMBICORT) 160-4.5 MCG/ACT inhaler Inhale 2 puffs into the lungs 2 (two) times daily. 1 Inhaler 12  . hydrochlorothiazide (HYDRODIURIL) 25 MG tablet Take 1 tablet (25 mg total) by mouth daily. 30 tablet 3  . HYDROcodone-homatropine (HYCODAN) 5-1.5 MG/5ML syrup Take 5 mLs by mouth every 6 (six) hours as needed for cough. 180 mL 0  . hydrOXYzine (ATARAX/VISTARIL) 25 MG tablet 1-2 tabs by mouth three times per day as needed for itching 90 tablet 3  . losartan (COZAAR) 100 MG tablet TAKE ONE TABLET BY MOUTH ONCE DAILY 90 tablet 2  . naproxen (NAPROSYN) 500 MG tablet Take 1 tablet (500 mg total) by mouth 2 (two) times daily with a meal. 60 tablet 3  . omeprazole (PRILOSEC) 20 MG capsule Take 1 capsule (20 mg total) by mouth daily. 90 capsule 3  . ondansetron (ZOFRAN) 4 MG tablet Take 1 tablet (4 mg total) by mouth every 8 (eight) hours as needed for nausea or vomiting. 30 tablet 1  . traMADol (ULTRAM) 50 MG tablet Take 1 tablet (50 mg total) by mouth every 6 (six) hours as needed. 15 tablet 0  . losartan (COZAAR) 100 MG tablet Take 1 tablet (100 mg total) by mouth daily. 30 tablet 11   No  current facility-administered medications on file prior to visit.   Review of Systems  Constitutional: Negative for unusual diaphoresis or other sweats  HENT: Negative for ringing in ear Eyes: Negative for double vision or worsening visual disturbance.  Respiratory: Negative for choking and stridor.   Gastrointestinal: Negative for vomiting or other signifcant bowel change Genitourinary: Negative for hematuria or decreased urine volume.  Musculoskeletal: Negative for other MSK pain or swelling Skin: Negative for color change and worsening wound.  Neurological: Negative for tremors and numbness other than noted  Psychiatric/Behavioral: Negative for  decreased concentration or agitation other than above       Objective:   Physical Exam BP 130/90 mmHg  Pulse 106  Temp(Src) 98.6 F (37 C) (Oral)  Wt 267 lb (121.11 kg)  SpO2 96% VS noted, mild ill Constitutional: Pt appears well-developed, well-nourished.  HENT: Head: NCAT.  Right Ear: External ear normal.  Left Ear: External ear normal.  Bilat tm's with mild erythema.  Max sinus areas non tender.  Pharynx with mild erythema, no exudate Eyes: . Pupils are equal, round, and reactive to light. Conjunctivae and EOM are normal Neck: Normal range of motion. Neck supple.  Cardiovascular: Normal rate and regular rhythm.   Pulmonary/Chest: Effort normal and breath sounds decreased bilat without rales or wheezing.  Neurological: Pt is alert. Not confused , motor grossly intact Skin: Skin is warm. No rash Psychiatric: Pt behavior is normal. No agitation.     Assessment & Plan:

## 2014-03-24 NOTE — Assessment & Plan Note (Signed)
stable overall by history and exam, recent data reviewed with pt, and pt to continue medical treatment as before,  to f/u any worsening symptoms or concerns BP Readings from Last 3 Encounters:  03/24/14 130/90  01/28/14 150/76  07/16/13 130/82

## 2014-03-24 NOTE — Assessment & Plan Note (Signed)
stable overall by history and exam, recent data reviewed with pt, and pt to continue medical treatment as before,  to f/u any worsening symptoms or concerns SpO2 Readings from Last 3 Encounters:  03/24/14 96%  01/28/14 98%  07/16/13 96%

## 2014-03-24 NOTE — Assessment & Plan Note (Signed)
Ok for belviq trial,  to f/u any worsening symptoms or concerns

## 2014-03-26 ENCOUNTER — Telehealth: Payer: Self-pay | Admitting: Internal Medicine

## 2014-03-26 NOTE — Telephone Encounter (Signed)
emmi emailed °

## 2014-04-06 DIAGNOSIS — H20011 Primary iridocyclitis, right eye: Secondary | ICD-10-CM | POA: Diagnosis not present

## 2014-04-06 DIAGNOSIS — H43811 Vitreous degeneration, right eye: Secondary | ICD-10-CM | POA: Diagnosis not present

## 2014-04-28 DIAGNOSIS — H209 Unspecified iridocyclitis: Secondary | ICD-10-CM | POA: Diagnosis not present

## 2014-05-05 DIAGNOSIS — H209 Unspecified iridocyclitis: Secondary | ICD-10-CM | POA: Diagnosis not present

## 2014-05-12 DIAGNOSIS — H209 Unspecified iridocyclitis: Secondary | ICD-10-CM | POA: Diagnosis not present

## 2014-05-12 DIAGNOSIS — H15101 Unspecified episcleritis, right eye: Secondary | ICD-10-CM | POA: Diagnosis not present

## 2014-06-02 DIAGNOSIS — Z87891 Personal history of nicotine dependence: Secondary | ICD-10-CM | POA: Diagnosis not present

## 2014-06-02 DIAGNOSIS — M2550 Pain in unspecified joint: Secondary | ICD-10-CM | POA: Diagnosis not present

## 2014-06-02 DIAGNOSIS — G8929 Other chronic pain: Secondary | ICD-10-CM | POA: Diagnosis not present

## 2014-06-02 DIAGNOSIS — H15101 Unspecified episcleritis, right eye: Secondary | ICD-10-CM | POA: Diagnosis not present

## 2014-06-02 DIAGNOSIS — I10 Essential (primary) hypertension: Secondary | ICD-10-CM | POA: Diagnosis not present

## 2014-06-02 DIAGNOSIS — H2011 Chronic iridocyclitis, right eye: Secondary | ICD-10-CM | POA: Diagnosis not present

## 2014-06-02 DIAGNOSIS — M1990 Unspecified osteoarthritis, unspecified site: Secondary | ICD-10-CM | POA: Diagnosis not present

## 2014-06-02 DIAGNOSIS — Z881 Allergy status to other antibiotic agents status: Secondary | ICD-10-CM | POA: Diagnosis not present

## 2014-06-14 DIAGNOSIS — M25572 Pain in left ankle and joints of left foot: Secondary | ICD-10-CM | POA: Diagnosis not present

## 2014-06-14 DIAGNOSIS — S8261XP Displaced fracture of lateral malleolus of right fibula, subsequent encounter for closed fracture with malunion: Secondary | ICD-10-CM | POA: Diagnosis not present

## 2014-06-15 ENCOUNTER — Other Ambulatory Visit (HOSPITAL_COMMUNITY): Payer: Self-pay | Admitting: Orthopedic Surgery

## 2014-06-21 NOTE — Pre-Procedure Instructions (Signed)
Betty Green  06/21/2014   Your procedure is scheduled on:  Wednesday Jun 23, 2014 at 10:24 AM.  Report to [facility] Admitting at 8:20 AM.  Call this number if you have problems the morning of surgery: [phone]   Remember:   Do not eat food or drink liquids after midnight.   Take these medicines the morning of surgery with A SIP OF WATER: Albuterol inhaler if needed, Eye drops, Symbicort inhaler if needed, Omeprazole (Prilosec),  and Tramadol (Ultram) if needed for pain   Please stop taking any vitamins, Naproxen/Naprosyn, Advil, Motrin, herbal medications   Bring CPAP mask day of surgery    Do not wear jewelry, make-up or nail polish.  Do not wear lotions, powders, or perfumes.   Do not shave 48 hours prior to surgery.  Do not bring valuables to the hospital.  [facility] is not responsible for any belongings or valuables.               Contacts, dentures or bridgework may not be worn into surgery.  Leave suitcase in the car. After surgery it may be brought to your room.  For patients admitted to the hospital, discharge time is determined by your treatment team.               Patients discharged the day of surgery will not be allowed to drive home.  Name and phone number of your driver:   Special Instructions: Shower using CHG soap the night before and the morning of your surgery   Please read over the following fact sheets that you were given: Pain Booklet, Coughing and Deep Breathing and Surgical Site Infection Prevention

## 2014-06-22 ENCOUNTER — Encounter (HOSPITAL_COMMUNITY): Payer: Self-pay

## 2014-06-22 ENCOUNTER — Encounter (HOSPITAL_COMMUNITY)
Admission: RE | Admit: 2014-06-22 | Discharge: 2014-06-22 | Disposition: A | Discharge status: Home / Self Care | Payer: Medicare Other | Source: Ambulatory Visit | Attending: Orthopedic Surgery | Admitting: Orthopedic Surgery

## 2014-06-22 DIAGNOSIS — Z87891 Personal history of nicotine dependence: Secondary | ICD-10-CM | POA: Diagnosis not present

## 2014-06-22 DIAGNOSIS — X58XXXD Exposure to other specified factors, subsequent encounter: Secondary | ICD-10-CM | POA: Diagnosis not present

## 2014-06-22 DIAGNOSIS — S82891P Other fracture of right lower leg, subsequent encounter for closed fracture with malunion: Secondary | ICD-10-CM | POA: Diagnosis not present

## 2014-06-22 DIAGNOSIS — G4730 Sleep apnea, unspecified: Secondary | ICD-10-CM | POA: Diagnosis not present

## 2014-06-22 DIAGNOSIS — I10 Essential (primary) hypertension: Secondary | ICD-10-CM | POA: Diagnosis not present

## 2014-06-22 DIAGNOSIS — E785 Hyperlipidemia, unspecified: Secondary | ICD-10-CM | POA: Diagnosis not present

## 2014-06-22 HISTORY — DX: Other specified postprocedural states: Z98.890

## 2014-06-22 HISTORY — DX: Reserved for inherently not codable concepts without codable children: IMO0001

## 2014-06-22 HISTORY — DX: Gastro-esophageal reflux disease without esophagitis: K21.9

## 2014-06-22 HISTORY — DX: Nausea with vomiting, unspecified: R11.2

## 2014-06-22 LAB — COMPREHENSIVE METABOLIC PANEL
ALK PHOS: 92 U/L (ref 38–126)
ALT: 14 U/L (ref 14–54)
ANION GAP: 9 (ref 5–15)
AST: 19 U/L (ref 15–41)
Albumin: 3.5 g/dL (ref 3.5–5.0)
BILIRUBIN TOTAL: 0.6 mg/dL (ref 0.3–1.2)
BUN: 8 mg/dL (ref 6–20)
CHLORIDE: 106 mmol/L (ref 101–111)
CO2: 27 mmol/L (ref 22–32)
Calcium: 9.5 mg/dL (ref 8.9–10.3)
Creatinine, Ser: 1.03 mg/dL — ABNORMAL HIGH (ref 0.44–1.00)
GFR calc Af Amer: >60 mL/min (ref >60)
GFR calc non Af Amer: 54 mL/min — ABNORMAL LOW (ref >60)
Glucose, Bld: 100 mg/dL — ABNORMAL HIGH (ref 65–99)
POTASSIUM: 3.8 mmol/L (ref 3.5–5.1)
SODIUM: 142 mmol/L (ref 135–145)
TOTAL PROTEIN: 7.9 g/dL (ref 6.5–8.1)

## 2014-06-22 LAB — APTT: aPTT: 35 seconds (ref 24–37)

## 2014-06-22 LAB — CBC
HEMATOCRIT: 38.7 % (ref 36.0–46.0)
Hemoglobin: 12.4 g/dL (ref 12.0–15.0)
MCH: 28.1 pg (ref 26.0–34.0)
MCHC: 32 g/dL (ref 30.0–36.0)
MCV: 87.8 fL (ref 78.0–100.0)
Platelets: 307 10*3/uL (ref 150–400)
RBC: 4.41 MIL/uL (ref 3.87–5.11)
RDW: 14.4 % (ref 11.5–15.5)
WBC: 6.1 10*3/uL (ref 4.0–10.5)

## 2014-06-22 LAB — PROTIME-INR
INR: 1.05 (ref 0.00–1.49)
Prothrombin Time: 13.8 seconds (ref 11.6–15.2)

## 2014-06-22 MED ORDER — CLINDAMYCIN PHOSPHATE 900 MG/50ML IV SOLN
900.0000 mg | INTRAVENOUS | Status: AC
  Administered 2014-06-23: 900 mg via INTRAVENOUS
  Filled 2014-06-22: qty 50

## 2014-06-22 NOTE — Progress Notes (Signed)
Patient denied having any acute cardiac or pulmonary issues, and informed Nurse that she has not used Albuterol inhaler in well over a month. PCP is Cathlean Cower. Patient informed Nurse that she uses a CPAP machine "sometimes, not every day." Nurse instructed patient to bring CPAP mask DOS. Patient verbalized understanding.

## 2014-06-23 ENCOUNTER — Ambulatory Visit (HOSPITAL_COMMUNITY): Payer: Medicare Other | Admitting: Anesthesiology

## 2014-06-23 ENCOUNTER — Encounter (HOSPITAL_COMMUNITY)
Admission: RE | Disposition: A | Discharge status: Home / Self Care | Payer: Self-pay | Source: Ambulatory Visit | Attending: Orthopedic Surgery

## 2014-06-23 ENCOUNTER — Encounter (HOSPITAL_COMMUNITY): Payer: Self-pay | Admitting: *Deleted

## 2014-06-23 ENCOUNTER — Ambulatory Visit (HOSPITAL_COMMUNITY)
Admission: RE | Admit: 2014-06-23 | Discharge: 2014-06-24 | Disposition: A | Discharge status: Home / Self Care | Payer: Medicare Other | Source: Ambulatory Visit | Attending: Orthopedic Surgery | Admitting: Orthopedic Surgery

## 2014-06-23 DIAGNOSIS — S82891P Other fracture of right lower leg, subsequent encounter for closed fracture with malunion: Secondary | ICD-10-CM | POA: Insufficient documentation

## 2014-06-23 DIAGNOSIS — G4730 Sleep apnea, unspecified: Secondary | ICD-10-CM | POA: Insufficient documentation

## 2014-06-23 DIAGNOSIS — X58XXXD Exposure to other specified factors, subsequent encounter: Secondary | ICD-10-CM | POA: Insufficient documentation

## 2014-06-23 DIAGNOSIS — E785 Hyperlipidemia, unspecified: Secondary | ICD-10-CM | POA: Insufficient documentation

## 2014-06-23 DIAGNOSIS — I10 Essential (primary) hypertension: Secondary | ICD-10-CM | POA: Insufficient documentation

## 2014-06-23 DIAGNOSIS — Z87891 Personal history of nicotine dependence: Secondary | ICD-10-CM | POA: Diagnosis not present

## 2014-06-23 DIAGNOSIS — G8918 Other acute postprocedural pain: Secondary | ICD-10-CM | POA: Diagnosis not present

## 2014-06-23 DIAGNOSIS — S8261XQ Displaced fracture of lateral malleolus of right fibula, subsequent encounter for open fracture type I or II with malunion: Secondary | ICD-10-CM | POA: Diagnosis not present

## 2014-06-23 DIAGNOSIS — S93431S Sprain of tibiofibular ligament of right ankle, sequela: Secondary | ICD-10-CM | POA: Diagnosis not present

## 2014-06-23 DIAGNOSIS — S8261XP Displaced fracture of lateral malleolus of right fibula, subsequent encounter for closed fracture with malunion: Secondary | ICD-10-CM | POA: Diagnosis not present

## 2014-06-23 DIAGNOSIS — S82899A Other fracture of unspecified lower leg, initial encounter for closed fracture: Secondary | ICD-10-CM | POA: Diagnosis present

## 2014-06-23 HISTORY — DX: Unspecified chronic bronchitis: J42

## 2014-06-23 HISTORY — PX: ORIF ANKLE FRACTURE: SHX5408

## 2014-06-23 HISTORY — DX: Obstructive sleep apnea (adult) (pediatric): G47.33

## 2014-06-23 HISTORY — PX: ORIF ANKLE FRACTURE: SUR919

## 2014-06-23 HISTORY — DX: Obstructive sleep apnea (adult) (pediatric): Z99.89

## 2014-06-23 SURGERY — OPEN REDUCTION INTERNAL FIXATION (ORIF) ANKLE FRACTURE
Anesthesia: Regional | Site: Ankle | Laterality: Right

## 2014-06-23 MED ORDER — BUPIVACAINE-EPINEPHRINE (PF) 0.5% -1:200000 IJ SOLN
INTRAMUSCULAR | Status: DC | PRN
  Administered 2014-06-23: 30 mL via PERINEURAL

## 2014-06-23 MED ORDER — ONDANSETRON HCL 4 MG/2ML IJ SOLN
INTRAMUSCULAR | Status: DC | PRN
  Administered 2014-06-23: 4 mg via INTRAVENOUS

## 2014-06-23 MED ORDER — GLYCOPYRROLATE 0.2 MG/ML IJ SOLN
INTRAMUSCULAR | Status: DC | PRN
  Administered 2014-06-23: 0.2 mg via INTRAVENOUS

## 2014-06-23 MED ORDER — ACETAMINOPHEN 325 MG PO TABS: 650.0000 mg | ORAL_TABLET | Freq: Four times a day (QID) | ORAL | Status: DC | PRN

## 2014-06-23 MED ORDER — LACTATED RINGERS IV SOLN
INTRAVENOUS | Status: DC
  Administered 2014-06-23: 09:00:00 via INTRAVENOUS

## 2014-06-23 MED ORDER — LIDOCAINE HCL (CARDIAC) 20 MG/ML IV SOLN
INTRAVENOUS | Status: AC
  Filled 2014-06-23: qty 5

## 2014-06-23 MED ORDER — FENTANYL CITRATE (PF) 100 MCG/2ML IJ SOLN
INTRAMUSCULAR | Status: AC
  Administered 2014-06-23: 100 ug
  Filled 2014-06-23: qty 2

## 2014-06-23 MED ORDER — HYDROMORPHONE HCL 1 MG/ML IJ SOLN: 0.2500 mg | INTRAMUSCULAR | Status: DC | PRN

## 2014-06-23 MED ORDER — ALBUTEROL SULFATE HFA 108 (90 BASE) MCG/ACT IN AERS: 2.0000 | INHALATION_SPRAY | Freq: Four times a day (QID) | RESPIRATORY_TRACT | Status: DC | PRN

## 2014-06-23 MED ORDER — METOPROLOL TARTRATE 1 MG/ML IV SOLN
INTRAVENOUS | Status: AC
  Filled 2014-06-23: qty 5

## 2014-06-23 MED ORDER — LOSARTAN POTASSIUM 50 MG PO TABS: 100.0000 mg | ORAL_TABLET | Freq: Every day | ORAL | Status: DC

## 2014-06-23 MED ORDER — PREDNISOLONE ACETATE 1 % OP SUSP
1.0000 [drp] | Freq: Four times a day (QID) | OPHTHALMIC | Status: DC
  Administered 2014-06-23 (×3): 1 [drp] via OPHTHALMIC
  Filled 2014-06-23: qty 1

## 2014-06-23 MED ORDER — ARTIFICIAL TEARS OP OINT
TOPICAL_OINTMENT | OPHTHALMIC | Status: DC | PRN
  Administered 2014-06-23: 1 via OPHTHALMIC

## 2014-06-23 MED ORDER — MIDAZOLAM HCL 2 MG/2ML IJ SOLN
INTRAMUSCULAR | Status: AC
  Administered 2014-06-23: 1 mg
  Filled 2014-06-23: qty 2

## 2014-06-23 MED ORDER — ONDANSETRON HCL 4 MG PO TABS: 4.0000 mg | ORAL_TABLET | Freq: Four times a day (QID) | ORAL | Status: DC | PRN

## 2014-06-23 MED ORDER — PHENYLEPHRINE 40 MCG/ML (10ML) SYRINGE FOR IV PUSH (FOR BLOOD PRESSURE SUPPORT)
PREFILLED_SYRINGE | INTRAVENOUS | Status: AC
  Filled 2014-06-23: qty 10

## 2014-06-23 MED ORDER — FENTANYL CITRATE (PF) 250 MCG/5ML IJ SOLN
INTRAMUSCULAR | Status: AC
  Filled 2014-06-23: qty 5

## 2014-06-23 MED ORDER — PANTOPRAZOLE SODIUM 40 MG PO TBEC: 40.0000 mg | DELAYED_RELEASE_TABLET | Freq: Every day | ORAL | Status: DC

## 2014-06-23 MED ORDER — SUCCINYLCHOLINE CHLORIDE 20 MG/ML IJ SOLN
INTRAMUSCULAR | Status: DC | PRN
  Administered 2014-06-23: 100 mg via INTRAVENOUS

## 2014-06-23 MED ORDER — LACTATED RINGERS IV SOLN
INTRAVENOUS | Status: DC | PRN
  Administered 2014-06-23: 10:00:00 via INTRAVENOUS

## 2014-06-23 MED ORDER — LOSARTAN POTASSIUM 50 MG PO TABS
100.0000 mg | ORAL_TABLET | Freq: Every day | ORAL | Status: DC
  Filled 2014-06-23: qty 2

## 2014-06-23 MED ORDER — SODIUM CHLORIDE 0.9 % IV SOLN
INTRAVENOUS | Status: DC
  Administered 2014-06-23: 22:00:00 via INTRAVENOUS

## 2014-06-23 MED ORDER — BUDESONIDE 0.25 MG/2ML IN SUSP
0.2500 mg | Freq: Two times a day (BID) | RESPIRATORY_TRACT | Status: DC
  Administered 2014-06-23 – 2014-06-24 (×2): 0.25 mg via RESPIRATORY_TRACT
  Filled 2014-06-23 (×2): qty 2

## 2014-06-23 MED ORDER — SUCCINYLCHOLINE CHLORIDE 20 MG/ML IJ SOLN
INTRAMUSCULAR | Status: AC
  Filled 2014-06-23: qty 1

## 2014-06-23 MED ORDER — ARTIFICIAL TEARS OP OINT
TOPICAL_OINTMENT | OPHTHALMIC | Status: AC
  Filled 2014-06-23: qty 3.5

## 2014-06-23 MED ORDER — BUDESONIDE-FORMOTEROL FUMARATE 160-4.5 MCG/ACT IN AERO: 2.0000 | INHALATION_SPRAY | Freq: Two times a day (BID) | RESPIRATORY_TRACT | Status: DC

## 2014-06-23 MED ORDER — PHENYLEPHRINE HCL 10 MG/ML IJ SOLN
INTRAMUSCULAR | Status: DC | PRN
  Administered 2014-06-23 (×3): 40 ug via INTRAVENOUS

## 2014-06-23 MED ORDER — FENTANYL CITRATE (PF) 100 MCG/2ML IJ SOLN
INTRAMUSCULAR | Status: DC | PRN
  Administered 2014-06-23 (×2): 50 ug via INTRAVENOUS

## 2014-06-23 MED ORDER — GLYCOPYRROLATE 0.2 MG/ML IJ SOLN
INTRAMUSCULAR | Status: AC
  Filled 2014-06-23: qty 1

## 2014-06-23 MED ORDER — PROPOFOL 10 MG/ML IV BOLUS
INTRAVENOUS | Status: DC | PRN
  Administered 2014-06-23: 60 mg via INTRAVENOUS
  Administered 2014-06-23: 140 mg via INTRAVENOUS

## 2014-06-23 MED ORDER — METOPROLOL TARTRATE 1 MG/ML IV SOLN
INTRAVENOUS | Status: DC | PRN
  Administered 2014-06-23: 1 mg via INTRAVENOUS

## 2014-06-23 MED ORDER — METOCLOPRAMIDE HCL 5 MG/ML IJ SOLN: 5.0000 mg | Freq: Three times a day (TID) | INTRAMUSCULAR | Status: DC | PRN

## 2014-06-23 MED ORDER — METOCLOPRAMIDE HCL 5 MG PO TABS: 5.0000 mg | ORAL_TABLET | Freq: Three times a day (TID) | ORAL | Status: DC | PRN

## 2014-06-23 MED ORDER — ONDANSETRON HCL 4 MG/2ML IJ SOLN: 4.0000 mg | Freq: Four times a day (QID) | INTRAMUSCULAR | Status: DC | PRN

## 2014-06-23 MED ORDER — CLINDAMYCIN PHOSPHATE 600 MG/50ML IV SOLN
600.0000 mg | Freq: Four times a day (QID) | INTRAVENOUS | Status: AC
  Administered 2014-06-23 – 2014-06-24 (×2): 600 mg via INTRAVENOUS
  Filled 2014-06-23 (×3): qty 50

## 2014-06-23 MED ORDER — OXYCODONE HCL 5 MG PO TABS
5.0000 mg | ORAL_TABLET | ORAL | Status: DC | PRN
  Administered 2014-06-23 – 2014-06-24 (×5): 10 mg via ORAL
  Filled 2014-06-23 (×6): qty 2

## 2014-06-23 MED ORDER — ACETAMINOPHEN 650 MG RE SUPP: 650.0000 mg | Freq: Four times a day (QID) | RECTAL | Status: DC | PRN

## 2014-06-23 MED ORDER — METHOCARBAMOL 1000 MG/10ML IJ SOLN
500.0000 mg | Freq: Four times a day (QID) | INTRAVENOUS | Status: DC | PRN
  Filled 2014-06-23: qty 5

## 2014-06-23 MED ORDER — 0.9 % SODIUM CHLORIDE (POUR BTL) OPTIME
TOPICAL | Status: DC | PRN
  Administered 2014-06-23: 1000 mL

## 2014-06-23 MED ORDER — ALBUTEROL SULFATE (2.5 MG/3ML) 0.083% IN NEBU
2.5000 mg | INHALATION_SOLUTION | RESPIRATORY_TRACT | Status: DC | PRN
Start: 2014-06-23 — End: 2014-06-24

## 2014-06-23 MED ORDER — ASPIRIN EC 325 MG PO TBEC
325.0000 mg | DELAYED_RELEASE_TABLET | Freq: Every day | ORAL | Status: DC
  Administered 2014-06-24: 325 mg via ORAL
  Filled 2014-06-23: qty 1

## 2014-06-23 MED ORDER — BUPIVACAINE HCL (PF) 0.5 % IJ SOLN
INTRAMUSCULAR | Status: DC | PRN
  Administered 2014-06-23: 15 mL

## 2014-06-23 MED ORDER — METHOCARBAMOL 500 MG PO TABS
500.0000 mg | ORAL_TABLET | Freq: Four times a day (QID) | ORAL | Status: DC | PRN
  Administered 2014-06-24: 500 mg via ORAL
  Filled 2014-06-23: qty 1

## 2014-06-23 MED ORDER — HYDROMORPHONE HCL 1 MG/ML IJ SOLN: 1.0000 mg | INTRAMUSCULAR | Status: DC | PRN

## 2014-06-23 MED ORDER — PROPOFOL 10 MG/ML IV BOLUS
INTRAVENOUS | Status: AC
  Filled 2014-06-23: qty 20

## 2014-06-23 MED ORDER — ATROPINE SULFATE 1 % OP SOLN
1.0000 [drp] | Freq: Three times a day (TID) | OPHTHALMIC | Status: DC
  Administered 2014-06-23 (×2): 1 [drp] via OPHTHALMIC
  Filled 2014-06-23: qty 2

## 2014-06-23 MED ORDER — ONDANSETRON HCL 4 MG/2ML IJ SOLN
INTRAMUSCULAR | Status: AC
  Filled 2014-06-23: qty 2

## 2014-06-23 SURGICAL SUPPLY — 44 items
BANDAGE ESMARK 6X9 LF (GAUZE/BANDAGES/DRESSINGS) ×1 IMPLANT
BNDG COHESIVE 4X5 TAN STRL (GAUZE/BANDAGES/DRESSINGS) ×2 IMPLANT
BNDG ESMARK 6X9 LF (GAUZE/BANDAGES/DRESSINGS) ×2
BNDG GAUZE ELAST 4 BULKY (GAUZE/BANDAGES/DRESSINGS) ×2 IMPLANT
COVER SURGICAL LIGHT HANDLE (MISCELLANEOUS) ×4 IMPLANT
CUFF TOURNIQUET SINGLE 34IN LL (TOURNIQUET CUFF) IMPLANT
CUFF TOURNIQUET SINGLE 44IN (TOURNIQUET CUFF) IMPLANT
DRAPE INCISE IOBAN 66X45 STRL (DRAPES) ×2 IMPLANT
DRAPE OEC MINIVIEW 54X84 (DRAPES) ×2 IMPLANT
DRAPE PROXIMA HALF (DRAPES) ×2 IMPLANT
DRAPE U-SHAPE 47X51 STRL (DRAPES) ×2 IMPLANT
DRSG ADAPTIC 3X8 NADH LF (GAUZE/BANDAGES/DRESSINGS) ×2 IMPLANT
DRSG PAD ABDOMINAL 8X10 ST (GAUZE/BANDAGES/DRESSINGS) ×2 IMPLANT
DURAPREP 26ML APPLICATOR (WOUND CARE) ×2 IMPLANT
ELECT REM PT RETURN 9FT ADLT (ELECTROSURGICAL) ×2
ELECTRODE REM PT RTRN 9FT ADLT (ELECTROSURGICAL) ×1 IMPLANT
GAUZE SPONGE 4X4 12PLY STRL (GAUZE/BANDAGES/DRESSINGS) ×2 IMPLANT
GLOVE BIOGEL PI IND STRL 9 (GLOVE) ×1 IMPLANT
GLOVE BIOGEL PI INDICATOR 9 (GLOVE) ×1
GLOVE SURG ORTHO 9.0 STRL STRW (GLOVE) ×2 IMPLANT
GOWN STRL REUS W/ TWL XL LVL3 (GOWN DISPOSABLE) ×3 IMPLANT
GOWN STRL REUS W/TWL XL LVL3 (GOWN DISPOSABLE) ×3
KIT BASIN OR (CUSTOM PROCEDURE TRAY) ×2 IMPLANT
KIT ROOM TURNOVER OR (KITS) ×2 IMPLANT
MANIFOLD NEPTUNE II (INSTRUMENTS) ×2 IMPLANT
NS IRRIG 1000ML POUR BTL (IV SOLUTION) ×2 IMPLANT
PACK ORTHO EXTREMITY (CUSTOM PROCEDURE TRAY) ×2 IMPLANT
PAD ARMBOARD 7.5X6 YLW CONV (MISCELLANEOUS) ×4 IMPLANT
PLATE LCP 3.5 1/3 TUB 7HX81 (Plate) ×2 IMPLANT
SCREW CORTEX 3.5 12MM (Screw) ×1 IMPLANT
SCREW CORTEX 3.5 14MM (Screw) ×1 IMPLANT
SCREW CORTEX 3.5 50MM (Screw) ×4 IMPLANT
SCREW LOCK CORT ST 3.5X12 (Screw) ×1 IMPLANT
SCREW LOCK CORT ST 3.5X14 (Screw) ×1 IMPLANT
SCREW LOCK T15 FT 14X3.5X2.9X (Screw) ×2 IMPLANT
SCREW LOCKING 3.5X14 (Screw) ×2 IMPLANT
SPONGE LAP 18X18 X RAY DECT (DISPOSABLE) ×2 IMPLANT
STAPLER VISISTAT 35W (STAPLE) IMPLANT
SUCTION FRAZIER TIP 10 FR DISP (SUCTIONS) ×2 IMPLANT
SUT ETHILON 2 0 PSLX (SUTURE) ×2 IMPLANT
SUT VIC AB 2-0 CTB1 (SUTURE) ×4 IMPLANT
TOWEL OR 17X24 6PK STRL BLUE (TOWEL DISPOSABLE) ×2 IMPLANT
TOWEL OR 17X26 10 PK STRL BLUE (TOWEL DISPOSABLE) ×2 IMPLANT
TUBE CONNECTING 12X1/4 (SUCTIONS) ×2 IMPLANT

## 2014-06-23 NOTE — Progress Notes (Signed)
RT entered room to place patient on CPAP and patient stated she was going home tomorrow and would rather not wear it tonight. RT informed patient if she changes her mind to have RN contact RT and we would bring one up for her.

## 2014-06-23 NOTE — Progress Notes (Signed)
Orthopedic Tech Progress Note Patient Details:  Betty Green 09-15-1945 [id number]  Ortho Devices Type of Ortho Device: CAM walker Ortho Device/Splint Location: rle Ortho Device/Splint Interventions: Application   Magan Winnett 06/23/2014, 3:28 PM

## 2014-06-23 NOTE — Anesthesia Postprocedure Evaluation (Signed)
  Anesthesia Post-op Note  Patient: Betty Green  Procedure(s) Performed: Procedure(s): Take Down Malunion Right Ankle, Open Reduction Internal Fixation Right Ankle, Possible Syndesmosis Repair, Medial Joint Debridement (Right)  Patient Location: PACU  Anesthesia Type:General and block  Level of Consciousness: awake and alert   Airway and Oxygen Therapy: Patient Spontanous Breathing  Post-op Pain: none  Post-op Assessment: Post-op Vital signs reviewed, Patient's Cardiovascular Status Stable and Respiratory Function Stable  Post-op Vital Signs: Reviewed  Filed Vitals:   06/23/14 1345  BP: 132/67  Pulse: 84  Temp:   Resp: 20    Complications: No apparent anesthesia complications

## 2014-06-23 NOTE — H&P (Signed)
Betty Green is an 69 y.o. female.   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  Past Medical History  Diagnosis Date  . Bronchitis   . Allergy   . Asthma   . Depression   . Hypertension   . Arthritis   . Shoulder pain, bilateral 10/18/2010  . Anxiety 05/31/2011  . Insomnia 05/31/2011  . Other and unspecified hyperlipidemia 08/04/2012  . Sleep apnea     wears CPAP sometimes  . PONV (postoperative nausea and vomiting)   . Shortness of breath dyspnea     with exertion  . GERD (gastroesophageal reflux disease)     Past Surgical History  Procedure Laterality Date  . Cholecystectomy  1981  . Appendectomy  1966  . Tonsillectomy  1963  . Abdominal hysterectomy  1980  . Bilateral oophorectomy      Family History  Problem Relation Age of Onset  . Arthritis Mother   . Heart disease Mother   . Hypertension Mother   . Arthritis Father   . Heart disease Father   . Hypertension Father   . Arthritis Other   . Cancer Other     colon and prostate cancer   Social History:  reports that she quit smoking about 30 years ago. Her smoking use included Cigarettes. She has a .2 pack-year smoking history. She does not have any smokeless tobacco history on file. She reports that she does not drink alcohol or use illicit drugs.  Allergies:  Allergies  Allergen Reactions  . Amoxicillin-Pot Clavulanate     Hives  . Azithromycin Other (See Comments)    abd pain    No prescriptions prior to admission    Results for orders placed or performed during the hospital encounter of 06/22/14 (from the past 48 hour(s))  APTT     Status: None   Collection Time: 06/22/14  2:43 PM  Result Value Ref Range   aPTT 35 24 - 37 seconds  CBC     Status: None   Collection  Time: 06/22/14  2:43 PM  Result Value Ref Range   WBC 6.1 4.0 - 10.5 K/uL   RBC 4.41 3.87 - 5.11 MIL/uL   Hemoglobin 12.4 12.0 - 15.0 g/dL   HCT 38.7 36.0 - 46.0 %   MCV 87.8 78.0 - 100.0 fL   MCH 28.1 26.0 - 34.0 pg   MCHC 32.0 30.0 - 36.0 g/dL   RDW 14.4 11.5 - 15.5 %   Platelets 307 150 - 400 K/uL  Comprehensive metabolic panel     Status: Abnormal   Collection Time: 06/22/14  2:43 PM  Result Value Ref Range   Sodium 142 135 - 145 mmol/L   Potassium 3.8 3.5 - 5.1 mmol/L   Chloride 106 101 - 111 mmol/L   CO2 27 22 - 32 mmol/L   Glucose, Bld 100 (H) 65 - 99 mg/dL   BUN 8 6 - 20 mg/dL   Creatinine, Ser 1.03 (H) 0.44 - 1.00 mg/dL   Calcium 9.5 8.9 - 10.3 mg/dL   Total Protein 7.9 6.5 - 8.1 g/dL   Albumin 3.5 3.5 - 5.0 g/dL   AST 19 15 - 41 U/L   ALT 14 14 - 54 U/L   Alkaline Phosphatase 92 38 - 126  U/L   Total Bilirubin 0.6 0.3 - 1.2 mg/dL   GFR calc non Af Amer 54 (L) >60 mL/min   GFR calc Af Amer >60 >60 mL/min    Comment: (NOTE) The eGFR has been calculated using the CKD EPI equation. This calculation has not been validated in all clinical situations. eGFR's persistently <60 mL/min signify possible Chronic Kidney Disease.    Anion gap 9 5 - 15  Protime-INR     Status: None   Collection Time: 06/22/14  2:43 PM  Result Value Ref Range   Prothrombin Time 13.8 11.6 - 15.2 seconds   INR 1.05 0.00 - 1.49   No results found.  Review of Systems  All other systems reviewed and are negative.   There were no vitals taken for this visit. Physical Exam  On examination patient has palpable pulses. She has pain with attempted range of motion. Radiographs shows a malunion nonunion of the fibular fracture with widening of the medial joint line. Assessment/Plan Assessment: Nonunion, malunion of the fibular fracture with widening of the medial joint line.  Plan: We will plan for takedown of the malunion revision internal fixation of the fibula. Cleansing of the joint space  medially. May require a syndesmotic screw. Risks and benefits were discussed patient states she understands and wishes to proceed at this time.  DUDA,MARCUS V 06/23/2014, 8:05 AM

## 2014-06-23 NOTE — Evaluation (Signed)
Physical Therapy Evaluation Patient Details Name: Betty Green MRN: [id number] DOB: 06-Aug-1945 Today's Date: 06/23/2014   History of Present Illness  Patient is a 69 y/o female s/p ORIF right ankle, take down malunion, possible syndesmosis Repair and medial joint debridement. PMH of asthma, depression, HTN, anxiety and insomnia.    Clinical Impression  Patient presents with balance deficits due to above surgery and new WB status impacting mobility. Pt lives alone and adamant about returning home. Wants to try knee walker next session. If knee walker does not work, pt may need w/c for mobility. Education provided on importance of NWB status RLE. Not able to maintain compliance throughout mobility assessment. If pt not able to ambulate household distances safely, may need ST SNF at d/c. Will continue to follow to improve transfers, gait, balance and overall safe mobility so pt can maximize independence.    Follow Up Recommendations Home health PT;Supervision/Assistance - 24 hour    Equipment Recommendations  Rolling walker with 5" wheels;Other (comment) (possibly w/c depending on how knee walker goes.)    Recommendations for Other Services OT consult     Precautions / Restrictions Precautions Precautions: Fall Required Braces or Orthoses: Other Brace/Splint Other Brace/Splint: cam boot RLE Restrictions Weight Bearing Restrictions: Yes RLE Weight Bearing: Non weight bearing      Mobility  Bed Mobility Overal bed mobility: Needs Assistance Bed Mobility: Supine to Sit     Supine to sit: Min guard;HOB elevated     General bed mobility comments: No physical assist needed.  Transfers Overall transfer level: Needs assistance Equipment used: Rolling walker (2 wheeled) Transfers: Sit to/from [facility] Sit to Stand: Min guard Stand pivot transfers: Min guard       General transfer comment: Min guard to rise from EOB x1, from [facility] x1. Cues to adhere to NWB RLE.  SPT bed to [facility] Min guard for safety. Compliant with WB status.   Ambulation/Gait Ambulation/Gait assistance: Min assist Ambulation Distance (Feet): 4 Feet Assistive device: Rolling walker (2 wheeled) Gait Pattern/deviations: Step-to pattern;Trunk flexed   Gait velocity interpretation: Below normal speed for age/gender General Gait Details: Performed "hop to" gait pattern with Min A for balance. Compliant with NWB RLE. Dyspnea present. Unsteady.  Stairs            Wheelchair Mobility    Modified Rankin (Stroke Patients Only)       Balance Overall balance assessment: Needs assistance Sitting-balance support: Feet supported;No upper extremity supported Sitting balance-Leahy Scale: Good Sitting balance - Comments: Total A to donn CAM boot RLE.    Standing balance support: During functional activity Standing balance-Leahy Scale: Fair Standing balance comment: Pt able to perform pericare in standing however non compliant with WB status. "i have to be able to do it."                             Pertinent Vitals/Pain Pain Assessment: No/denies pain    Home Living Family/patient expects to be discharged to:: Private residence Living Arrangements: Alone Available Help at Discharge: Friend(s);Family;Available PRN/intermittently Type of Home: House Home Access: Stairs to enter Entrance Stairs-Rails: Right Entrance Stairs-Number of Steps: 3 Home Layout: One level Home Equipment: Cane - single point      Prior Function Level of Independence: Independent with assistive device(s)         Comments: Pt reports using [facility] PTA. Drives.     Hand Dominance  Extremity/Trunk Assessment   Upper Extremity Assessment: Defer to OT evaluation           Lower Extremity Assessment: RLE deficits/detail RLE Deficits / Details: AROM WFL hip, knee. Able to perform LAQ. Not able to wiggle toes due to impaired sensation.       Communication   Communication:  No difficulties  Cognition Arousal/Alertness: Awake/alert Behavior During Therapy: WFL for tasks assessed/performed Overall Cognitive Status: Within Functional Limits for tasks assessed                      General Comments General comments (skin integrity, edema, etc.): Discussion with patient about disposition. Pt adamant about returning home at d/c and not going to a rehab facility. Reports being able to get help PRN at home. Interested in knee walker for mobility.    Exercises        Assessment/Plan    PT Assessment Patient needs continued PT services  PT Diagnosis Difficulty walking   PT Problem List Cardiopulmonary status limiting activity;Decreased activity tolerance;Impaired sensation;Decreased balance;Decreased mobility;Decreased knowledge of use of DME  PT Treatment Interventions Balance training;Gait training;Stair training;Functional mobility training;Patient/family education;Wheelchair mobility training;Therapeutic activities;Therapeutic exercise;DME instruction   PT Goals (Current goals can be found in the Care Plan section) Acute Rehab PT Goals Patient Stated Goal: to return home at d/c PT Goal Formulation: With patient Time For Goal Achievement: 07/07/14 Potential to Achieve Goals: Fair    Frequency Min 4X/week   Barriers to discharge Decreased caregiver support;Inaccessible home environment Pt lives alone and has to negotiate 3 steps to enter home.    Co-evaluation               End of Session Equipment Utilized During Treatment: Gait belt;Other (comment) (CAM boot.) Activity Tolerance: Patient tolerated treatment well Patient left: in chair;with call bell/phone within reach Nurse Communication: Mobility status;Weight bearing status    Functional Assessment Tool Used: Clinical judgment Functional Limitation: Mobility: Walking and moving around Mobility: Walking and Moving Around Current Status (G4010): At least 20 percent but less than 40  percent impaired, limited or restricted Mobility: Walking and Moving Around Goal Status [phone]): At least 1 percent but less than 20 percent impaired, limited or restricted    Time: 1531-1550 PT Time Calculation (min) (ACUTE ONLY): 19 min   Charges:   PT Evaluation $Initial PT Evaluation Tier I: 1 Procedure     PT G Codes:   PT G-Codes **NOT FOR INPATIENT CLASS** Functional Assessment Tool Used: Clinical judgment Functional Limitation: Mobility: Walking and moving around Mobility: Walking and Moving Around Current Status (G6440): At least 20 percent but less than 40 percent impaired, limited or restricted Mobility: Walking and Moving Around Goal Status [phone]): At least 1 percent but less than 20 percent impaired, limited or restricted    [place] 06/23/2014, 4:03 PM  Wray Kearns, [place], DPT [phone]

## 2014-06-23 NOTE — Op Note (Signed)
06/23/2014  11:22 AM  PATIENT:  Betty Green    PRE-OPERATIVE DIAGNOSIS:  Malunion Right Ankle Fracture  POST-OPERATIVE DIAGNOSIS:  Same  PROCEDURE:  Take Down Malunion Right Ankle, Open Reduction Internal Fixation Right Ankle, Syndesmosis Repair, Medial Joint Debridement  SURGEON:  Newt Minion, MD  PHYSICIAN ASSISTANT:None ANESTHESIA:   General  PREOPERATIVE INDICATIONS:  [name] is a  69 y.o. female with a diagnosis of Malunion Right Ankle Fracture who failed conservative measures and elected for surgical management.    The risks benefits and alternatives were discussed with the patient preoperatively including but not limited to the risks of infection, bleeding, nerve injury, cardiopulmonary complications, the need for revision surgery, among others, and the patient was willing to proceed.  OPERATIVE IMPLANTS: Syndesmotic screw and 8 hole one third tubular plate  OPERATIVE FINDINGS: Fibrous tissue within the medial gutter and a malunion nonunion of a Weber B fracture.  OPERATIVE PROCEDURE: Patient was brought to the operating room after undergoing a popliteal block she then underwent a general anesthetic. After adequate levels of anesthesia were obtained patient's right lower extremity was prepped using DuraPrep draped into a sterile field. A timeout was called. A lateral incision was made over the fibula this was carried sharply down to the fracture site. An osteotome was used to free up the Weber B fracture. The joint was irrigated with normal saline. A separate incision was then made longitudinally over the medial gutter. This was sharply carried down to the gutter and a knife was used to debride the scar tissue within the medial gutter. The fibula was then pulled out to length stabilized with an interfrag screw a anti-glide neutralization plate was then placed laterally and this was locked distally with 2 locking screws and 2 compression screws proximally. A King tong clamp  was then used to reduce the mortise. After this was reduced a 50 mm syndesmotic screw was placed. C-arm floss be verified alignment and verified congruence of the mortise. The wounds were irrigated with normal saline. The incision was closed using 2-0 nylon. A sterile compressive dressing was applied. Patient was extubated taken to the PACU in stable condition.

## 2014-06-23 NOTE — Transfer of Care (Signed)
Immediate Anesthesia Transfer of Care Note  Patient: Betty Green  Procedure(s) Performed: Procedure(s): Take Down Malunion Right Ankle, Open Reduction Internal Fixation Right Ankle, Possible Syndesmosis Repair, Medial Joint Debridement (Right)  Patient Location: PACU  Anesthesia Type:General  Level of Consciousness: awake, alert , oriented and patient cooperative  Airway & Oxygen Therapy: Patient Spontanous Breathing and Patient connected to nasal cannula oxygen  Post-op Assessment: Report given to RN, Post -op Vital signs reviewed and stable and Patient moving all extremities  Post vital signs: Reviewed and stable  Last Vitals:  Filed Vitals:   06/23/14 0950  BP:   Pulse: 96  Temp:   Resp: 25    Complications: No apparent anesthesia complications

## 2014-06-23 NOTE — Anesthesia Procedure Notes (Addendum)
Anesthesia Regional Block:  Popliteal block  Pre-Anesthetic Checklist: ,, timeout performed, Correct Patient, Correct Site, Correct Laterality, Correct Procedure, Correct Position, site marked, Risks and benefits discussed, pre-op evaluation, post-op pain management  Laterality: Right  Prep: Maximum Sterile Barrier Precautions used and chloraprep       Needles:  Injection technique: Single-shot  Needle Type: Echogenic Stimulator Needle     Needle Length: 9cm 9 cm Needle Gauge: 21 and 21 G    Additional Needles:  Procedures: ultrasound guided (picture in chart) and nerve stimulator Popliteal block  Nerve Stimulator or Paresthesia:  Response: Peroneal,  Response: Tibial,   Additional Responses:   Narrative:  Start time: 06/23/2014 9:40 AM End time: 06/23/2014 9:51 AM Injection made incrementally with aspirations every 5 mL. Anesthesiologist: Roderic Palau  Additional Notes: 2% Lidocaine skin wheel. Saphenous block with 10cc of 0.5% Bupivicaine plain.   Procedure Name: Intubation Date/Time: 06/23/2014 10:20 AM Performed by: Rogers Blocker Pre-anesthesia Checklist: Patient identified, Emergency Drugs available, Suction available, Patient being monitored and Timeout performed Patient Re-evaluated:Patient Re-evaluated prior to inductionOxygen Delivery Method: Circle system utilized Preoxygenation: Pre-oxygenation with 100% oxygen Intubation Type: IV induction Ventilation: Mask ventilation without difficulty Laryngoscope Size: Mac and 3 Grade View: Grade II Tube type: Oral Tube size: 7.5 mm Number of attempts: 1 Airway Equipment and Method: Stylet (Pt ramped up with blankets.) Placement Confirmation: ETT inserted through vocal cords under direct vision,  positive ETCO2,  CO2 detector and breath sounds checked- equal and bilateral Secured at: 21 cm Tube secured with: Tape Dental Injury: Teeth and Oropharynx as per pre-operative assessment

## 2014-06-23 NOTE — Anesthesia Preprocedure Evaluation (Addendum)
Anesthesia Evaluation  Patient identified by MRN, date of birth, ID band Patient awake    Reviewed: Allergy & Precautions, H&P , NPO status , Patient's Chart, lab work & pertinent test results  History of Anesthesia Complications (+) PONV  Airway Mallampati: II  TM Distance: >3 FB Neck ROM: Full    Dental no notable dental hx. (+) Teeth Intact, Dental Advisory Given   Pulmonary asthma , sleep apnea and Continuous Positive Airway Pressure Ventilation , former smoker,  breath sounds clear to auscultation  Pulmonary exam normal       Cardiovascular hypertension, Pt. on medications Rhythm:Regular Rate:Normal     Neuro/Psych Anxiety Depression negative neurological ROS     GI/Hepatic Neg liver ROS, GERD-  Controlled,  Endo/Other  Morbid obesity  Renal/GU negative Renal ROS  negative genitourinary   Musculoskeletal  (+) Arthritis -, Osteoarthritis,    Abdominal   Peds  Hematology negative hematology ROS (+)   Anesthesia Other Findings   Reproductive/Obstetrics negative OB ROS                           Anesthesia Physical Anesthesia Plan  ASA: III  Anesthesia Plan: General and Regional   Post-op Pain Management:    Induction: Intravenous  Airway Management Planned: Oral ETT  Additional Equipment:   Intra-op Plan:   Post-operative Plan: Extubation in OR  Informed Consent: I have reviewed the patients History and Physical, chart, labs and discussed the procedure including the risks, benefits and alternatives for the proposed anesthesia with the patient or authorized representative who has indicated his/her understanding and acceptance.   Dental advisory given  Plan Discussed with: CRNA  Anesthesia Plan Comments:         Anesthesia Quick Evaluation

## 2014-06-24 ENCOUNTER — Encounter (HOSPITAL_COMMUNITY): Payer: Self-pay | Admitting: Orthopedic Surgery

## 2014-06-24 DIAGNOSIS — E785 Hyperlipidemia, unspecified: Secondary | ICD-10-CM | POA: Diagnosis not present

## 2014-06-24 DIAGNOSIS — S82891P Other fracture of right lower leg, subsequent encounter for closed fracture with malunion: Secondary | ICD-10-CM | POA: Diagnosis not present

## 2014-06-24 DIAGNOSIS — Z87891 Personal history of nicotine dependence: Secondary | ICD-10-CM | POA: Diagnosis not present

## 2014-06-24 DIAGNOSIS — G4730 Sleep apnea, unspecified: Secondary | ICD-10-CM | POA: Diagnosis not present

## 2014-06-24 DIAGNOSIS — I10 Essential (primary) hypertension: Secondary | ICD-10-CM | POA: Diagnosis not present

## 2014-06-24 MED ORDER — OXYCODONE-ACETAMINOPHEN 5-325 MG PO TABS: 1.0000 | ORAL_TABLET | ORAL | Status: DC | PRN

## 2014-06-24 NOTE — Progress Notes (Signed)
Physical Therapy Treatment Patient Details Name: Betty Green MRN: [id number] DOB: 1946/01/24 Today's Date: 06/24/2014    History of Present Illness Patient is a 69 y/o female s/p ORIF right ankle, take down malunion, possible syndesmosis Repair and medial joint debridement. PMH of asthma, depression, HTN, anxiety and insomnia.    PT Comments    Patient progressing well with mobility. Tolerated short distance ambulation with use of RW for support. Practiced using knee walker within room for short distances however required min A to get RLE placed correctly. Recommend use of RW and w/c at home initially for household distances and await HHPT to use knee walker for safety. Tolerated stair negotiation with Min A. Will have help from daughter per pt report to get into home. Expressed importance of WB status and wearing CAM boot for proper healing. Will continue to follow and progress as tolerated.   Follow Up Recommendations  Home health PT;Supervision/Assistance - 24 hour     Equipment Recommendations  Rolling walker with 5" wheels;Wheelchair (measurements PT);Wheelchair cushion (measurements PT);Other (comment) (knee walker.)    Recommendations for Other Services       Precautions / Restrictions Precautions Precautions: Fall Required Braces or Orthoses: Other Brace/Splint Other Brace/Splint: cam boot RLE Restrictions Weight Bearing Restrictions: Yes RLE Weight Bearing: Partial weight bearing    Mobility  Bed Mobility Overal bed mobility: Needs Assistance Bed Mobility: Supine to Sit     Supine to sit: Supervision;HOB elevated     General bed mobility comments: No physical assist needed.  Transfers Overall transfer level: Needs assistance Equipment used: Rolling walker (2 wheeled) Transfers: Sit to/from Stand Sit to Stand: Min guard         General transfer comment: Min guard to rise from EOB x1, from chair x1. Cues to adhere to [facility] RLE. Compliant with PWB RLE.  Transferred to chair.  Ambulation/Gait Ambulation/Gait assistance: Min assist Ambulation Distance (Feet): 5 Feet Assistive device: Rolling walker (2 wheeled) Gait Pattern/deviations: Step-to pattern;Trunk flexed   Gait velocity interpretation: Below normal speed for age/gender General Gait Details: Performed "hop to" gait pattern with Min A for balance. Compliant with PWB RLE. Dyspnea present.   Stairs Stairs: Yes Stairs assistance: Min assist Stair Management: Two rails;Step to pattern;Forwards Number of Stairs: 2 General stair comments: Min A to ascend/descend steps. Heavy use of rails for support. Not fully compliant with PWB RLE during stair negotiation.  Wheelchair Mobility    Modified Rankin (Stroke Patients Only)       Balance Overall balance assessment: Needs assistance Sitting-balance support: Feet supported;No upper extremity supported Sitting balance-Leahy Scale: Good     Standing balance support: During functional activity Standing balance-Leahy Scale: Fair                      Cognition Arousal/Alertness: Awake/alert Behavior During Therapy: WFL for tasks assessed/performed Overall Cognitive Status: Within Functional Limits for tasks assessed                      Exercises      General Comments General comments (skin integrity, edema, etc.): Instructed pt on how to use knee walker. Used it ~40'. Required Min A to get on it initially. Encouraged more use of w/c and RW at home until able to practice using knee walker with HHPT.       Pertinent Vitals/Pain Pain Assessment: No/denies pain    Home Living  Prior Function            PT Goals (current goals can now be found in the care plan section) Progress towards PT goals: Progressing toward goals    Frequency  Min 4X/week    PT Plan Current plan remains appropriate    Co-evaluation             End of Session Equipment Utilized During  Treatment: Gait belt;Other (comment) Activity Tolerance: Patient tolerated treatment well Patient left: in chair;with call bell/phone within reach     Time: 1030-1050 PT Time Calculation (min) (ACUTE ONLY): 20 min  Charges:  $Gait Training: 8-22 mins                    G Codes:      [place] 06/24/2014, 11:43 AM Wray Kearns, PT, DPT [phone]

## 2014-06-24 NOTE — Progress Notes (Signed)
[name] to be D/C'd Home per MD order. Discussed with the patient and all questions fully answered.    Medication List    TAKE these medications        albuterol 108 (90 BASE) MCG/ACT inhaler  Commonly known as:  PROAIR HFA  Inhale 2 puffs into the lungs every 6 (six) hours as needed for wheezing.     aspirin EC 81 MG tablet  Take 1 tablet (81 mg total) by mouth daily.     atropine 1 % ophthalmic solution  Place 1 drop into the right eye 3 (three) times daily.     budesonide-formoterol 160-4.5 MCG/ACT inhaler  Commonly known as:  SYMBICORT  Inhale 2 puffs into the lungs 2 (two) times daily.     hydrochlorothiazide 25 MG tablet  Commonly known as:  HYDRODIURIL  Take 1 tablet (25 mg total) by mouth daily.     HYDROcodone-homatropine 5-1.5 MG/5ML syrup  Commonly known as:  HYCODAN  Take 5 mLs by mouth every 6 (six) hours as needed for cough.     hydrOXYzine 25 MG tablet  Commonly known as:  ATARAX/VISTARIL  1-2 tabs by mouth three times per day as needed for itching     levofloxacin 250 MG tablet  Commonly known as:  LEVAQUIN  Take 1 tablet (250 mg total) by mouth daily.     Lorcaserin HCl 10 MG Tabs  Commonly known as:  BELVIQ  Take 10 mg by mouth 2 (two) times daily.     losartan 100 MG tablet  Commonly known as:  COZAAR  TAKE ONE TABLET BY MOUTH ONCE DAILY     naproxen 500 MG tablet  Commonly known as:  NAPROSYN  Take 1 tablet (500 mg total) by mouth 2 (two) times daily with a meal.     omeprazole 20 MG capsule  Commonly known as:  PRILOSEC  Take 1 capsule (20 mg total) by mouth daily.     ondansetron 4 MG tablet  Commonly known as:  ZOFRAN  Take 1 tablet (4 mg total) by mouth every 8 (eight) hours as needed for nausea or vomiting.     oxyCODONE-acetaminophen 5-325 MG per tablet  Commonly known as:  ROXICET  Take 1 tablet by mouth every 4 (four) hours as needed for severe pain.     prednisoLONE acetate 1 % ophthalmic suspension  Commonly known as:   PRED FORTE  Place 1 drop into the right eye 4 (four) times daily.     traMADol 50 MG tablet  Commonly known as:  ULTRAM  Take 1 tablet (50 mg total) by mouth every 6 (six) hours as needed.        VVS, Skin clean, dry and intact without evidence of skin break down, no evidence of skin tears noted.  IV catheter discontinued intact. Site without signs and symptoms of complications. Dressing and pressure applied.  An After Visit Summary was printed and given to the patient.  Patient escorted via [place], and D/C home via private auto.  Cyndra Numbers  06/24/2014 3:34 PM

## 2014-06-24 NOTE — Care Management Note (Signed)
Case Management Note  Patient Details  Name: Betty Green MRN: [id number] Date of Birth: Jun 10, 1945  Subjective/Objective:   69 yr old female admitted s/p fall with a right ankle fracture,patient underwent a right ankle ORIF.      Action/Plan: Case manager spoke with patient concerning home health and DME needs. Choice was offered. Referral was called to [place], [facility]. Patient states she has family support at discharge.    Expected Discharge Date: 06/24/14                 Expected Discharge Plan: Home with Home Health     In-House Referral:     Discharge planning Services     Post Acute Care Choice:    Choice offered to:     DME Arranged: Wheelchair, RW   DME Agency: [facility]    HH Arranged: PT   [place] Agency: [facility]    Status of Service: Completed     Medicare Important Message Given:    Date Medicare IM Given:    Medicare IM give by:    Date Additional Medicare IM Given:    Additional Medicare Important Message give by:     If discussed at [place] of Stay Meetings, dates discussed:    Additional Comments:  Ninfa Meeker, RN 06/24/2014, 11:57 AM

## 2014-06-24 NOTE — Discharge Summary (Signed)
  Final diagnosis malunion, nonunion right ankle fracture dislocation. Surgical procedure open reduction internal fixation with takedown of the nonunion internal fixation of the fibula and internal fixation of the syndesmosis. Patient's hospital course was essentially unremarkable. Discharge to home in stable condition. Prescriptions for Percocet. Continue aspirin daily. Weightbearing status touchdown weightbearing on the right.

## 2014-06-25 DIAGNOSIS — S82841P Displaced bimalleolar fracture of right lower leg, subsequent encounter for closed fracture with malunion: Secondary | ICD-10-CM | POA: Diagnosis not present

## 2014-06-25 DIAGNOSIS — K219 Gastro-esophageal reflux disease without esophagitis: Secondary | ICD-10-CM | POA: Diagnosis not present

## 2014-06-25 DIAGNOSIS — E785 Hyperlipidemia, unspecified: Secondary | ICD-10-CM | POA: Diagnosis not present

## 2014-06-25 DIAGNOSIS — F419 Anxiety disorder, unspecified: Secondary | ICD-10-CM | POA: Diagnosis not present

## 2014-06-25 DIAGNOSIS — I10 Essential (primary) hypertension: Secondary | ICD-10-CM | POA: Diagnosis not present

## 2014-06-25 DIAGNOSIS — F328 Other depressive episodes: Secondary | ICD-10-CM | POA: Diagnosis not present

## 2014-06-25 DIAGNOSIS — S82841K Displaced bimalleolar fracture of right lower leg, subsequent encounter for closed fracture with nonunion: Secondary | ICD-10-CM | POA: Diagnosis not present

## 2014-06-29 DIAGNOSIS — S82843A Displaced bimalleolar fracture of unspecified lower leg, initial encounter for closed fracture: Secondary | ICD-10-CM | POA: Diagnosis not present

## 2014-06-30 DIAGNOSIS — S93431S Sprain of tibiofibular ligament of right ankle, sequela: Secondary | ICD-10-CM | POA: Diagnosis not present

## 2014-06-30 DIAGNOSIS — S8261XP Displaced fracture of lateral malleolus of right fibula, subsequent encounter for closed fracture with malunion: Secondary | ICD-10-CM | POA: Diagnosis not present

## 2014-07-07 DIAGNOSIS — E785 Hyperlipidemia, unspecified: Secondary | ICD-10-CM | POA: Diagnosis not present

## 2014-07-07 DIAGNOSIS — F328 Other depressive episodes: Secondary | ICD-10-CM | POA: Diagnosis not present

## 2014-07-07 DIAGNOSIS — I10 Essential (primary) hypertension: Secondary | ICD-10-CM | POA: Diagnosis not present

## 2014-07-07 DIAGNOSIS — S82841K Displaced bimalleolar fracture of right lower leg, subsequent encounter for closed fracture with nonunion: Secondary | ICD-10-CM | POA: Diagnosis not present

## 2014-07-07 DIAGNOSIS — F419 Anxiety disorder, unspecified: Secondary | ICD-10-CM | POA: Diagnosis not present

## 2014-07-07 DIAGNOSIS — K219 Gastro-esophageal reflux disease without esophagitis: Secondary | ICD-10-CM | POA: Diagnosis not present

## 2014-07-07 DIAGNOSIS — S82841P Displaced bimalleolar fracture of right lower leg, subsequent encounter for closed fracture with malunion: Secondary | ICD-10-CM | POA: Diagnosis not present

## 2014-07-19 DIAGNOSIS — S8261XP Displaced fracture of lateral malleolus of right fibula, subsequent encounter for closed fracture with malunion: Secondary | ICD-10-CM | POA: Diagnosis not present

## 2014-07-30 DIAGNOSIS — S82843A Displaced bimalleolar fracture of unspecified lower leg, initial encounter for closed fracture: Secondary | ICD-10-CM | POA: Diagnosis not present

## 2014-08-24 ENCOUNTER — Other Ambulatory Visit (INDEPENDENT_AMBULATORY_CARE_PROVIDER_SITE_OTHER): Payer: Medicare Other

## 2014-08-24 ENCOUNTER — Encounter: Payer: Self-pay | Admitting: Internal Medicine

## 2014-08-24 ENCOUNTER — Ambulatory Visit (INDEPENDENT_AMBULATORY_CARE_PROVIDER_SITE_OTHER): Payer: Medicare Other | Admitting: Internal Medicine

## 2014-08-24 VITALS — BP 128/82 | HR 78 | Temp 97.7°F | Ht 60.0 in | Wt 267.0 lb

## 2014-08-24 DIAGNOSIS — I10 Essential (primary) hypertension: Secondary | ICD-10-CM

## 2014-08-24 DIAGNOSIS — R739 Hyperglycemia, unspecified: Secondary | ICD-10-CM

## 2014-08-24 DIAGNOSIS — Z23 Encounter for immunization: Secondary | ICD-10-CM

## 2014-08-24 DIAGNOSIS — Z0000 Encounter for general adult medical examination without abnormal findings: Secondary | ICD-10-CM | POA: Diagnosis not present

## 2014-08-24 DIAGNOSIS — S82891S Other fracture of right lower leg, sequela: Secondary | ICD-10-CM | POA: Diagnosis not present

## 2014-08-24 LAB — URINALYSIS, ROUTINE W REFLEX MICROSCOPIC
Bilirubin Urine: NEGATIVE
HGB URINE DIPSTICK: NEGATIVE
KETONES UR: NEGATIVE
Leukocytes, UA: NEGATIVE
Nitrite: NEGATIVE
PH: 5.5 (ref 5.0–8.0)
TOTAL PROTEIN, URINE-UPE24: NEGATIVE
Urine Glucose: NEGATIVE
Urobilinogen, UA: 0.2 (ref 0.0–1.0)

## 2014-08-24 LAB — BASIC METABOLIC PANEL
BUN: 10 mg/dL (ref 6–23)
CHLORIDE: 106 meq/L (ref 96–112)
CO2: 27 mEq/L (ref 19–32)
CREATININE: 0.87 mg/dL (ref 0.40–1.20)
Calcium: 9.8 mg/dL (ref 8.4–10.5)
GFR: 82.73 mL/min (ref >60.00)
GLUCOSE: 103 mg/dL — AB (ref 70–99)
POTASSIUM: 4.2 meq/L (ref 3.5–5.1)
SODIUM: 140 meq/L (ref 135–145)

## 2014-08-24 LAB — CBC WITH DIFFERENTIAL/PLATELET
BASOS PCT: 0.7 % (ref 0.0–3.0)
Basophils Absolute: 0.1 10*3/uL (ref 0.0–0.1)
EOS PCT: 5.2 % — AB (ref 0.0–5.0)
Eosinophils Absolute: 0.4 10*3/uL (ref 0.0–0.7)
HCT: 39.5 % (ref 36.0–46.0)
Hemoglobin: 13 g/dL (ref 12.0–15.0)
LYMPHS ABS: 2.2 10*3/uL (ref 0.7–4.0)
Lymphocytes Relative: 30.4 % (ref 12.0–46.0)
MCHC: 33 g/dL (ref 30.0–36.0)
MCV: 86.6 fl (ref 78.0–100.0)
MONO ABS: 0.5 10*3/uL (ref 0.1–1.0)
MONOS PCT: 6.6 % (ref 3.0–12.0)
NEUTROS ABS: 4.2 10*3/uL (ref 1.4–7.7)
NEUTROS PCT: 57.1 % (ref 43.0–77.0)
Platelets: 316 10*3/uL (ref 150.0–400.0)
RBC: 4.56 Mil/uL (ref 3.87–5.11)
RDW: 15.5 % (ref 11.5–15.5)
WBC: 7.3 10*3/uL (ref 4.0–10.5)

## 2014-08-24 LAB — LIPID PANEL
CHOLESTEROL: 210 mg/dL — AB (ref 0–200)
HDL: 53 mg/dL (ref >39.00)
LDL CALC: 131 mg/dL — AB (ref 0–99)
NonHDL: 157
Total CHOL/HDL Ratio: 4
Triglycerides: 132 mg/dL (ref 0.0–149.0)
VLDL: 26.4 mg/dL (ref 0.0–40.0)

## 2014-08-24 LAB — HEPATIC FUNCTION PANEL
ALT: 10 U/L (ref 0–35)
AST: 14 U/L (ref 0–37)
Albumin: 4 g/dL (ref 3.5–5.2)
Alkaline Phosphatase: 87 U/L (ref 39–117)
BILIRUBIN DIRECT: 0 mg/dL (ref 0.0–0.3)
BILIRUBIN TOTAL: 0.4 mg/dL (ref 0.2–1.2)
TOTAL PROTEIN: 7.8 g/dL (ref 6.0–8.3)

## 2014-08-24 LAB — HEMOGLOBIN A1C: Hgb A1c MFr Bld: 5.6 % (ref 4.6–6.5)

## 2014-08-24 LAB — TSH: TSH: 0.85 u[IU]/mL (ref 0.35–4.50)

## 2014-08-24 MED ORDER — TRAMADOL HCL (ER BIPHASIC) 300 MG PO CP24: ORAL_CAPSULE | ORAL | Status: DC

## 2014-08-24 MED ORDER — LOSARTAN POTASSIUM 100 MG PO TABS
100.0000 mg | ORAL_TABLET | Freq: Every day | ORAL | Status: DC
Start: 2014-08-24 — End: 2015-12-28

## 2014-08-24 MED ORDER — HYDROXYZINE HCL 25 MG PO TABS: ORAL_TABLET | ORAL | Status: DC

## 2014-08-24 MED ORDER — OMEPRAZOLE 20 MG PO CPDR: 20.0000 mg | DELAYED_RELEASE_CAPSULE | Freq: Every day | ORAL | Status: DC

## 2014-08-24 MED ORDER — PNEUMOCOCCAL VAC POLYVALENT 25 MCG/0.5ML IJ INJ
0.5000 mL | INJECTION | Freq: Once | INTRAMUSCULAR | Status: AC
  Administered 2014-08-24: 0.5 mL via INTRAMUSCULAR

## 2014-08-24 NOTE — Assessment & Plan Note (Signed)

## 2014-08-24 NOTE — Patient Instructions (Addendum)
You had the Pneumovax pneumonia shot today  Please take all new medication as prescribed - the tramadol  Please continue all other medications as before, and refills have been done if requested.  Please have the pharmacy call with any other refills you may need.  Please continue your efforts at being more active, low cholesterol diet, and weight control.  You are otherwise up to date with prevention measures today.  Please keep your appointments with your specialists as you may have planned  Please go to the LAB in the Basement (turn left off the elevator) for the tests to be done today  You will be contacted by phone if any changes need to be made immediately.  Otherwise, you will receive a letter about your results with an explanation, but please check with MyChart first.  Please remember to sign up for MyChart if you have not done so, as this will be important to you in the future with finding out test results, communicating by private email, and scheduling acute appointments online when needed.  Please return in 1 year for your yearly visit, or sooner if needed, with Lab testing done 3-5 days before

## 2014-08-24 NOTE — Progress Notes (Signed)
Subjective:    Patient ID: Betty Green, female    DOB: 05-15-1945, 69 y.o.   MRN: [id number]  HPI Here for wellness and f/u;  Overall doing ok;  Pt denies Chest pain, worsening SOB, DOE, wheezing, orthopnea, PND, worsening LE edema, palpitations, dizziness or syncope.  Pt denies neurological change such as new headache, facial or extremity weakness.  Pt denies polydipsia, polyuria, or low sugar symptoms. Pt states overall good compliance with treatment and medications, good tolerability, and has been trying to follow appropriate diet.  Pt denies worsening depressive symptoms, suicidal ideation or panic. No fever, night sweats, wt loss, loss of appetite, or other constitutional symptoms.  Pt states good ability with ADL's, has low fall risk, home safety reviewed and adequate, no other significant changes in hearing or vision, and only occasionally active with exercise  Now with chronic pain right ankle after 2 recent surguries, tramadol and naproxyn not helping Past Medical History  Diagnosis Date  . Allergy   . Asthma   . Hypertension   . Shoulder pain, bilateral 10/18/2010  . Insomnia 05/31/2011  . Other and unspecified hyperlipidemia 08/04/2012  . PONV (postoperative nausea and vomiting)   . Shortness of breath dyspnea     with exertion  . GERD (gastroesophageal reflux disease)   . Chronic bronchitis   . OSA on CPAP     wears CPAP sometimes (06/23/2014)  . Arthritis     "all over"  . Anxiety     "take RX prn"   Past Surgical History  Procedure Laterality Date  . Appendectomy  1966  . Tonsillectomy  1963  . Orif ankle fracture Right 06/23/2014  . Cholecystectomy open  1981  . Fracture surgery    . Total abdominal hysterectomy  1980    w/BSO  . Orif ankle fracture Right 06/23/2014    Procedure: Take Down Malunion Right Ankle, Open Reduction Internal Fixation Right Ankle, Possible Syndesmosis Repair, Medial Joint Debridement;  Surgeon: Newt Minion, MD;  Location: [facility];  Service:  Orthopedics;  Laterality: Right;    reports that she quit smoking about 30 years ago. Her smoking use included Cigarettes. She has a .2 pack-year smoking history. She has never used smokeless tobacco. She reports that she does not drink alcohol or use illicit drugs. family history includes Arthritis in her father, mother, and other; Cancer in her other; Heart disease in her father and mother; Hypertension in her father and mother. Allergies  Allergen Reactions  . Amoxicillin-Pot Clavulanate     Hives  . Azithromycin Other (See Comments)    abd pain   Current Outpatient Prescriptions on File Prior to Visit  Medication Sig Dispense Refill  . aspirin EC 81 MG tablet Take 1 tablet (81 mg total) by mouth daily. 150 tablet 2  . albuterol (PROAIR HFA) 108 (90 BASE) MCG/ACT inhaler Inhale 2 puffs into the lungs every 6 (six) hours as needed for wheezing. (Patient not taking: Reported on 08/24/2014) 1 Inhaler 11  . atropine 1 % ophthalmic solution Place 1 drop into the right eye 3 (three) times daily.  1  . budesonide-formoterol (SYMBICORT) 160-4.5 MCG/ACT inhaler Inhale 2 puffs into the lungs 2 (two) times daily. (Patient not taking: Reported on 08/24/2014) 1 Inhaler 12  . hydrochlorothiazide (HYDRODIURIL) 25 MG tablet Take 1 tablet (25 mg total) by mouth daily. (Patient not taking: Reported on 06/22/2014) 30 tablet 3  . HYDROcodone-homatropine (HYCODAN) 5-1.5 MG/5ML syrup Take 5 mLs by mouth every 6 (six)  hours as needed for cough. (Patient not taking: Reported on 06/22/2014) 180 mL 0  . Lorcaserin HCl (BELVIQ) 10 MG TABS Take 10 mg by mouth 2 (two) times daily. (Patient not taking: Reported on 06/22/2014) 60 tablet 5  . naproxen (NAPROSYN) 500 MG tablet Take 1 tablet (500 mg total) by mouth 2 (two) times daily with a meal. (Patient not taking: Reported on 08/24/2014) 60 tablet 3  . ondansetron (ZOFRAN) 4 MG tablet Take 1 tablet (4 mg total) by mouth every 8 (eight) hours as needed for nausea or vomiting.  (Patient not taking: Reported on 06/22/2014) 30 tablet 1  . oxyCODONE-acetaminophen (ROXICET) 5-325 MG per tablet Take 1 tablet by mouth every 4 (four) hours as needed for severe pain. (Patient not taking: Reported on 08/24/2014) 60 tablet 0  . prednisoLONE acetate (PRED FORTE) 1 % ophthalmic suspension Place 1 drop into the right eye 4 (four) times daily.  2   No current facility-administered medications on file prior to visit.    Review of Systems Constitutional: Negative for increased diaphoresis, other activity, appetite or siginficant weight change other than noted HENT: Negative for worsening hearing loss, ear pain, facial swelling, mouth sores and neck stiffness.   Eyes: Negative for other worsening pain, redness or visual disturbance.  Respiratory: Negative for shortness of breath and wheezing  Cardiovascular: Negative for chest pain and palpitations.  Gastrointestinal: Negative for diarrhea, blood in stool, abdominal distention or other pain Genitourinary: Negative for hematuria, flank pain or change in urine volume.  Musculoskeletal: Negative for myalgias or other joint complaints.  Skin: Negative for color change and wound or drainage.  Neurological: Negative for syncope and numbness. other than noted Hematological: Negative for adenopathy. or other swelling Psychiatric/Behavioral: Negative for hallucinations, SI, self-injury, decreased concentration or other worsening agitation.      Objective:   Physical Exam BP 128/82 mmHg  Pulse 78  Temp(Src) 97.7 F (36.5 C) (Oral)  Ht 5' (1.524 m)  Wt 267 lb (121.11 kg)  BMI 52.14 kg/m2  SpO2 97% VS noted,  Constitutional: Pt is oriented to person, place, and time. Appears well-developed and well-nourished, in no significant distress Head: Normocephalic and atraumatic.  Right Ear: External ear normal.  Left Ear: External ear normal.  Nose: Nose normal.  Mouth/Throat: Oropharynx is clear and moist.  Eyes: Conjunctivae and EOM are  normal. Pupils are equal, round, and reactive to light.  Neck: Normal range of motion. Neck supple. No JVD present. No tracheal deviation present or significant neck LA or mass Cardiovascular: Normal rate, regular rhythm, normal heart sounds and intact distal pulses.   Pulmonary/Chest: Effort normal and breath sounds without rales or wheezing  Abdominal: Soft. Bowel sounds are normal. NT. No HSM  Musculoskeletal: Normal range of motion. Exhibits no edema.  Lymphadenopathy:  Has no cervical adenopathy.  Neurological: Pt is alert and oriented to person, place, and time. Pt has normal reflexes. No cranial nerve deficit. Motor grossly intact Skin: Skin is warm and dry. No rash noted.  Psychiatric:  Has normal mood and affect. Behavior is normal.  Right ankle with mod tender, no effusion, bony deg changes    Assessment & Plan:

## 2014-08-24 NOTE — Assessment & Plan Note (Signed)
With chornic pain persistent - for tramadol ER 300 qd prn

## 2014-08-24 NOTE — Addendum Note (Signed)
Addended by: Lyman Bishop on: 08/24/2014 04:07 PM   Modules accepted: Orders

## 2014-08-24 NOTE — Progress Notes (Signed)
Pre visit review using our clinic review tool, if applicable. No additional management support is needed unless otherwise documented below in the visit note. 

## 2014-08-26 ENCOUNTER — Other Ambulatory Visit: Payer: Self-pay | Admitting: Internal Medicine

## 2014-08-26 DIAGNOSIS — S8261XP Displaced fracture of lateral malleolus of right fibula, subsequent encounter for closed fracture with malunion: Secondary | ICD-10-CM | POA: Diagnosis not present

## 2014-08-26 MED ORDER — ATORVASTATIN CALCIUM 10 MG PO TABS: 10.0000 mg | ORAL_TABLET | Freq: Every day | ORAL | Status: DC

## 2014-08-29 DIAGNOSIS — S82843A Displaced bimalleolar fracture of unspecified lower leg, initial encounter for closed fracture: Secondary | ICD-10-CM | POA: Diagnosis not present

## 2014-09-06 ENCOUNTER — Telehealth: Payer: Self-pay | Admitting: Internal Medicine

## 2014-09-06 NOTE — Telephone Encounter (Signed)
Patient Name: Betty Green DOB: 06/20/1945 Initial Comment Caller states Lipitor is causing itchiness, wants alternate medication Nurse Assessment Nurse: Vallery Sa, RN, Cathy Date/Time (Eastern Time): 09/06/2014 2:18:03 PM Confirm and document reason for call. If symptomatic, describe symptoms. ---Caller states she developed widespread itching 3 days ago that she thinks was related to her Lipitor medication. She is requesting that the MD change the medication. No severe breathing or swallowing difficulty. Has the patient traveled out of the country within the last 30 days? ---No Does the patient require triage? ---Yes Related visit to physician within the last 2 weeks? ---Yes Does the PT have any chronic conditions? (i.e. diabetes, asthma, etc.) ---Yes List chronic conditions. ---High Cholesterol, high Blood Pressure Guidelines Guideline Title Affirmed Question Affirmed Notes Final Disposition User Comments Information per Drugs.com on Lipitor side effects: Severity: Major. You should check with your doctor immediately if any of these side effects occur when taking atorvastatin: itching Caller shares that she is not having any symptoms at this time. She is just calling to see if MD wants to call in another Cholesterol medication for her. Called the office backline and Janace Hoard states to fax the request to the office and she will follow up with the MD covering for Dr. Jenny Reichmann.

## 2014-09-06 NOTE — Telephone Encounter (Signed)
Spoke with pt. Stated she took the lipitor last Wednesday or Thursday and started to experience a burning sensation on her back associated with some itchiness. She stopped taking the lipitor and took some hydroxyzine which brought the burning down. I asked her if she felt any indication of her throat swelling or any other symptoms. Pt denied any other symptoms and is fine today. She would like to be switched to a different medication.

## 2014-09-07 ENCOUNTER — Telehealth: Payer: Self-pay | Admitting: *Deleted

## 2014-09-07 MED ORDER — SIMVASTATIN 20 MG PO TABS: 20.0000 mg | ORAL_TABLET | Freq: Every day | ORAL | Status: DC

## 2014-09-07 NOTE — Telephone Encounter (Signed)
Ok to try change to zocor 20 mg - done erx

## 2014-09-07 NOTE — Telephone Encounter (Signed)
[facility] - Client [facility] Patient Name: Betty Green Gender: Female DOB: [birth year] Age: 69 Y 3 M 3 D Return Phone Number: [phone] (Primary) Address: City/State/Zip: [place] Client [facility] - Client Client Site [facility] - Day Physician [place], [place] Type Call Call Type Triage / Clinical Relationship To Patient Self Appointment Disposition EMR Appointment Not Necessary Info pasted into Epic Yes Return Phone Number [phone] (Primary) Chief Complaint Medication Question (non symptomatic) Initial Comment Caller states Lipitor is causing itchiness, wants alternate medication Nurse Assessment Nurse: Vallery Sa, RN, Tye [place] Date/Time (Eastern Time): 09/06/2014 2:18:03 PM Confirm and document reason for call. If symptomatic, describe symptoms. ---Caller states she developed widespread itching 3 days ago that she thinks was related to her Lipitor medication. She is requesting that the MD change the medication. No severe breathing or swallowing difficulty. Has the patient traveled out of the country within the last 30 days? ---No Does the patient require triage? ---Yes Related visit to physician within the last 2 weeks? ---Yes Does the PT have any chronic conditions? (i.e. diabetes, asthma, etc.) ---Yes List chronic conditions. ---High Cholesterol, high Blood Pressure Guidelines Guideline Title Affirmed Question Affirmed Notes Nurse Date/Time ([place] Time) Disp. Time Eilene Ghazi Time) Disposition Final User 09/06/2014 2:27:07 PM Clinical Call Yes Vallery Sa, RN, Tye [place] After Care Instructions Given Call Event Type User Date / Time Description Comments User: Berton Mount, RN Date/Time Eilene Ghazi Time): 09/06/2014 2:17:46 PM Information per Drugs.com on Lipitor side effects: Severity: Major. You should check with your doctor immediately if any of these side effects occur when taking  atorvastatin: .itching PLEASE NOTE: All timestamps contained within this report are represented as [place] Standard Time. CONFIDENTIALTY NOTICE: This fax transmission is intended only for the addressee. It contains information that is legally privileged, confidential or otherwise protected from use or disclosure. If you are not the intended recipient, you are strictly prohibited from reviewing, disclosing, copying using or disseminating any of this information or taking any action in reliance on or regarding this information. If you have received this fax in error, please notify us immediately by telephone so that we can arrange for its return to [place]. Phone: [phone], Toll-Free: [phone], Fax: [phone] Page: 2 of 2 Call Id: [id number] Comments User: Berton Mount, RN Date/Time Eilene Ghazi Time): 09/06/2014 2:25:56 PM Caller shares that she is not having any symptoms at this time. She is just calling to see if MD wants to call in another Cholesterol medication for her. Called the office backline and Janace Hoard states to fax the request to the office and she will follow up with the MD covering for Dr. Jenny Reichmann.

## 2014-09-08 NOTE — Telephone Encounter (Signed)
Pt advised.

## 2014-09-27 ENCOUNTER — Telehealth: Payer: Self-pay | Admitting: Internal Medicine

## 2014-09-28 ENCOUNTER — Encounter: Payer: Self-pay | Admitting: Internal Medicine

## 2014-09-28 ENCOUNTER — Ambulatory Visit (INDEPENDENT_AMBULATORY_CARE_PROVIDER_SITE_OTHER): Payer: Medicare Other | Admitting: Internal Medicine

## 2014-09-28 VITALS — BP 130/80 | HR 99 | Temp 97.5°F | Ht 60.0 in | Wt 261.0 lb

## 2014-09-28 DIAGNOSIS — J4520 Mild intermittent asthma, uncomplicated: Secondary | ICD-10-CM | POA: Diagnosis not present

## 2014-09-28 DIAGNOSIS — I10 Essential (primary) hypertension: Secondary | ICD-10-CM | POA: Diagnosis not present

## 2014-09-28 DIAGNOSIS — J019 Acute sinusitis, unspecified: Secondary | ICD-10-CM

## 2014-09-28 MED ORDER — AZITHROMYCIN 250 MG PO TABS: ORAL_TABLET | ORAL | Status: DC

## 2014-09-28 NOTE — Assessment & Plan Note (Signed)
stable overall by history and exam, recent data reviewed with pt, and pt to continue medical treatment as before,  to f/u any worsening symptoms or concerns BP Readings from Last 3 Encounters:  09/28/14 130/80  08/24/14 128/82  06/24/14 114/53

## 2014-09-28 NOTE — Assessment & Plan Note (Signed)
Mild to mod, for antibx course,  to f/u any worsening symptoms or concerns 

## 2014-09-28 NOTE — Patient Instructions (Signed)
Please take all new medication as prescribed - the antibiotic  Please continue all other medications as before, and refills have been done if requested.  Please have the pharmacy call with any other refills you may need.  Please keep your appointments with your specialists as you may have planned   

## 2014-09-28 NOTE — Progress Notes (Signed)
Subjective:    Patient ID: Betty Green, female    DOB: 1945/07/12, 69 y.o.   MRN: [id number]  HPI  Here with 2-3 days acute onset fever, facial pain, pressure, headache, general weakness and malaise, and greenish d/c, with mild ST and cough, but pt denies chest pain, wheezing, increased sob or doe, orthopnea, PND, increased LE swelling, palpitations, dizziness or syncope.   Past Medical History  Diagnosis Date  . Allergy   . Asthma   . Hypertension   . Shoulder pain, bilateral 10/18/2010  . Insomnia 05/31/2011  . Other and unspecified hyperlipidemia 08/04/2012  . PONV (postoperative nausea and vomiting)   . Shortness of breath dyspnea     with exertion  . GERD (gastroesophageal reflux disease)   . Chronic bronchitis   . OSA on CPAP     wears CPAP sometimes (06/23/2014)  . Arthritis     "all over"  . Anxiety     "take RX prn"   Past Surgical History  Procedure Laterality Date  . Appendectomy  1966  . Tonsillectomy  1963  . Orif ankle fracture Right 06/23/2014  . Cholecystectomy open  1981  . Fracture surgery    . Total abdominal hysterectomy  1980    w/BSO  . Orif ankle fracture Right 06/23/2014    Procedure: Take Down Malunion Right Ankle, Open Reduction Internal Fixation Right Ankle, Possible Syndesmosis Repair, Medial Joint Debridement;  Surgeon: Newt Minion, MD;  Location: [facility];  Service: Orthopedics;  Laterality: Right;    reports that she quit smoking about 30 years ago. Her smoking use included Cigarettes. She has a .2 pack-year smoking history. She has never used smokeless tobacco. She reports that she does not drink alcohol or use illicit drugs. family history includes Arthritis in her father, mother, and other; Cancer in her other; Heart disease in her father and mother; Hypertension in her father and mother. Allergies  Allergen Reactions  . Amoxicillin-Pot Clavulanate     Hives  . Azithromycin Other (See Comments)    abd pain  . Lipitor [Atorvastatin] Itching     Current Outpatient Prescriptions on File Prior to Visit  Medication Sig Dispense Refill  . aspirin EC 81 MG tablet Take 1 tablet (81 mg total) by mouth daily. 150 tablet 2  . hydrOXYzine (ATARAX/VISTARIL) 25 MG tablet 1-2 tabs by mouth three times per day as needed for itching 180 tablet 11  . omeprazole (PRILOSEC) 20 MG capsule Take 1 capsule (20 mg total) by mouth daily. 90 capsule 3  . prednisoLONE acetate (PRED FORTE) 1 % ophthalmic suspension Place 1 drop into the right eye 4 (four) times daily.  2  . simvastatin (ZOCOR) 20 MG tablet Take 1 tablet (20 mg total) by mouth at bedtime. 90 tablet 3  . TraMADol HCl 300 MG CP24 1 tab by mouth daily as needed 30 capsule 5  . albuterol (PROAIR HFA) 108 (90 BASE) MCG/ACT inhaler Inhale 2 puffs into the lungs every 6 (six) hours as needed for wheezing. (Patient not taking: Reported on 08/24/2014) 1 Inhaler 11  . atropine 1 % ophthalmic solution Place 1 drop into the right eye 3 (three) times daily.  1  . hydrochlorothiazide (HYDRODIURIL) 25 MG tablet Take 1 tablet (25 mg total) by mouth daily. (Patient not taking: Reported on 06/22/2014) 30 tablet 3  . losartan (COZAAR) 100 MG tablet Take 1 tablet (100 mg total) by mouth daily. (Patient not taking: Reported on 09/28/2014) 90 tablet 3  .  naproxen (NAPROSYN) 500 MG tablet Take 1 tablet (500 mg total) by mouth 2 (two) times daily with a meal. (Patient not taking: Reported on 08/24/2014) 60 tablet 3   No current facility-administered medications on file prior to visit.   Review of Systems  Constitutional: Negative for unusual diaphoresis or night sweats HENT: Negative for ringing in ear or discharge Eyes: Negative for double vision or worsening visual disturbance.  Respiratory: Negative for choking and stridor.   Gastrointestinal: Negative for vomiting or other signifcant bowel change Genitourinary: Negative for hematuria or change in urine volume.  Musculoskeletal: Negative for other MSK pain or  swelling Skin: Negative for color change and worsening wound.  Neurological: Negative for tremors and numbness other than noted  Psychiatric/Behavioral: Negative for decreased concentration or agitation other than above       Objective:   Physical Exam BP 130/80 mmHg  Pulse 99  Temp(Src) 97.5 F (36.4 C) (Oral)  Ht 5' (1.524 m)  Wt 261 lb (118.389 kg)  BMI 50.97 kg/m2  SpO2 98% VS noted, mild ill Constitutional: Pt appears in no significant distress HENT: Head: NCAT.  Right Ear: External ear normal.  Left Ear: External ear normal.  Eyes: . Pupils are equal, round, and reactive to light. Conjunctivae and EOM are normal Bilat tm's with mild erythema.  Max sinus areas severe tender.  Pharynx with mild erythema, no exudate Neck: Normal range of motion. Neck supple.  Cardiovascular: Normal rate and regular rhythm.   Pulmonary/Chest: Effort normal and breath sounds without rales or wheezing.  Neurological: Pt is alert. Not confused , motor grossly intact Skin: Skin is warm. No rash, no LE edema Psychiatric: Pt behavior is normal. No agitation.     Assessment & Plan:

## 2014-09-28 NOTE — Progress Notes (Signed)
Pre visit review using our clinic review tool, if applicable. No additional management support is needed unless otherwise documented below in the visit note. 

## 2014-09-28 NOTE — Assessment & Plan Note (Signed)
Ok to stop the symbicort as is not using and stable

## 2014-09-29 DIAGNOSIS — S82843A Displaced bimalleolar fracture of unspecified lower leg, initial encounter for closed fracture: Secondary | ICD-10-CM | POA: Diagnosis not present

## 2014-09-30 ENCOUNTER — Telehealth: Payer: Self-pay | Admitting: *Deleted

## 2014-09-30 DIAGNOSIS — R609 Edema, unspecified: Secondary | ICD-10-CM

## 2014-09-30 MED ORDER — HYDROCHLOROTHIAZIDE 25 MG PO TABS
25.0000 mg | ORAL_TABLET | Freq: Every day | ORAL | Status: DC | PRN
Start: 2014-09-30 — End: 2015-11-10

## 2014-09-30 NOTE — Telephone Encounter (Signed)
Left msg on triage stating saw md couple days ago and he was going to send a fluid pill into [facility]. Pharmacy haven't received requesting rx to be sent today. Call pt no answer LMOM [facility]

## 2014-10-30 DIAGNOSIS — S82843A Displaced bimalleolar fracture of unspecified lower leg, initial encounter for closed fracture: Secondary | ICD-10-CM | POA: Diagnosis not present

## 2014-11-29 DIAGNOSIS — S82843A Displaced bimalleolar fracture of unspecified lower leg, initial encounter for closed fracture: Secondary | ICD-10-CM | POA: Diagnosis not present

## 2014-11-30 DIAGNOSIS — H20013 Primary iridocyclitis, bilateral: Secondary | ICD-10-CM | POA: Diagnosis not present

## 2014-11-30 DIAGNOSIS — H35353 Cystoid macular degeneration, bilateral: Secondary | ICD-10-CM | POA: Diagnosis not present

## 2014-12-07 ENCOUNTER — Telehealth: Payer: Self-pay | Admitting: Internal Medicine

## 2014-12-07 DIAGNOSIS — H35353 Cystoid macular degeneration, bilateral: Secondary | ICD-10-CM | POA: Diagnosis not present

## 2014-12-07 NOTE — Telephone Encounter (Signed)
Ok to order 

## 2014-12-07 NOTE — Telephone Encounter (Signed)
Patient's opthamologist wants the following blood work done here @ the [facility]. Please enter the orders for this patient.  -CBC -ANA -RH Factor -ESR

## 2014-12-08 ENCOUNTER — Other Ambulatory Visit (INDEPENDENT_AMBULATORY_CARE_PROVIDER_SITE_OTHER): Payer: Medicare Other

## 2014-12-08 ENCOUNTER — Ambulatory Visit (INDEPENDENT_AMBULATORY_CARE_PROVIDER_SITE_OTHER): Payer: Medicare Other | Admitting: Internal Medicine

## 2014-12-08 VITALS — BP 134/82 | HR 103 | Temp 97.9°F | Ht 61.0 in | Wt 265.0 lb

## 2014-12-08 DIAGNOSIS — M2550 Pain in unspecified joint: Secondary | ICD-10-CM | POA: Insufficient documentation

## 2014-12-08 DIAGNOSIS — I10 Essential (primary) hypertension: Secondary | ICD-10-CM | POA: Diagnosis not present

## 2014-12-08 DIAGNOSIS — H20023 Recurrent acute iridocyclitis, bilateral: Secondary | ICD-10-CM

## 2014-12-08 DIAGNOSIS — F419 Anxiety disorder, unspecified: Secondary | ICD-10-CM | POA: Diagnosis not present

## 2014-12-08 LAB — CBC WITH DIFFERENTIAL/PLATELET
BASOS PCT: 0.9 % (ref 0.0–3.0)
Basophils Absolute: 0.1 10*3/uL (ref 0.0–0.1)
EOS ABS: 0.4 10*3/uL (ref 0.0–0.7)
Eosinophils Relative: 5.3 % — ABNORMAL HIGH (ref 0.0–5.0)
HEMATOCRIT: 39.7 % (ref 36.0–46.0)
Hemoglobin: 13.2 g/dL (ref 12.0–15.0)
Lymphocytes Relative: 25.9 % (ref 12.0–46.0)
Lymphs Abs: 2 10*3/uL (ref 0.7–4.0)
MCHC: 33.2 g/dL (ref 30.0–36.0)
MCV: 86 fl (ref 78.0–100.0)
MONO ABS: 0.4 10*3/uL (ref 0.1–1.0)
Monocytes Relative: 4.7 % (ref 3.0–12.0)
NEUTROS ABS: 5 10*3/uL (ref 1.4–7.7)
Neutrophils Relative %: 63.2 % (ref 43.0–77.0)
Platelets: 313 10*3/uL (ref 150.0–400.0)
RBC: 4.62 Mil/uL (ref 3.87–5.11)
RDW: 16.6 % — ABNORMAL HIGH (ref 11.5–15.5)
WBC: 7.8 10*3/uL (ref 4.0–10.5)

## 2014-12-08 LAB — RHEUMATOID FACTOR: Rhuematoid fact SerPl-aCnc: <10 IU/mL (ref <=14)

## 2014-12-08 LAB — SEDIMENTATION RATE: SED RATE: 56 mm/h — AB (ref 0–22)

## 2014-12-08 LAB — C-REACTIVE PROTEIN: CRP: 3.4 mg/dL (ref 0.5–20.0)

## 2014-12-08 MED ORDER — TRAMADOL HCL 50 MG PO TABS: 50.0000 mg | ORAL_TABLET | Freq: Three times a day (TID) | ORAL | Status: DC | PRN

## 2014-12-08 MED ORDER — ESCITALOPRAM OXALATE 10 MG PO TABS: 10.0000 mg | ORAL_TABLET | Freq: Every day | ORAL | Status: AC

## 2014-12-08 NOTE — Progress Notes (Signed)
Subjective:    Patient ID: [name], female    DOB: 1945/12/17, 69 y.o.   MRN: [id number]  HPI Here to f/u, has hx of polyarthralgia and iritis, asked to present per optho for lab evaluation.  Pt cont's with joint pain, but no effusions, or falls.  Tramadol ER too expensive. Pt denies chest pain, increased sob or doe, wheezing, orthopnea, PND, increased LE swelling, palpitations, dizziness or syncope.  Pt denies new neurological symptoms such as new headache, or facial or extremity weakness or numbness  Denies worsening depressive symptoms, suicidal ideation, or panic; has ongoing anxiety, worsened recently with ongoing pain and social stressors. Past Medical History  Diagnosis Date  . Allergy   . Asthma   . Hypertension   . Shoulder pain, bilateral 10/18/2010  . Insomnia 05/31/2011  . Other and unspecified hyperlipidemia 08/04/2012  . PONV (postoperative nausea and vomiting)   . Shortness of breath dyspnea     with exertion  . GERD (gastroesophageal reflux disease)   . Chronic bronchitis   . OSA on CPAP     wears CPAP sometimes (06/23/2014)  . Arthritis     "all over"  . Anxiety     "take RX prn"   Past Surgical History  Procedure Laterality Date  . Appendectomy  1966  . Tonsillectomy  1963  . Orif ankle fracture Right 06/23/2014  . Cholecystectomy open  1981  . Fracture surgery    . Total abdominal hysterectomy  1980    w/BSO  . Orif ankle fracture Right 06/23/2014    Procedure: Take Down Malunion Right Ankle, Open Reduction Internal Fixation Right Ankle, Possible Syndesmosis Repair, Medial Joint Debridement;  Surgeon: Newt Minion, MD;  Location: [facility];  Service: Orthopedics;  Laterality: Right;    reports that she quit smoking about 30 years ago. Her smoking use included Cigarettes. She has a .2 pack-year smoking history. She has never used smokeless tobacco. She reports that she does not drink alcohol or use illicit drugs. family history includes Arthritis in her father,  mother, and other; Cancer in her other; Heart disease in her father and mother; Hypertension in her father and mother. Allergies  Allergen Reactions  . Amoxicillin-Pot Clavulanate     Hives  . Azithromycin Other (See Comments)    abd pain  . Lipitor [Atorvastatin] Itching   Current Outpatient Prescriptions on File Prior to Visit  Medication Sig Dispense Refill  . aspirin EC 81 MG tablet Take 1 tablet (81 mg total) by mouth daily. 150 tablet 2  . atropine 1 % ophthalmic solution Place 1 drop into the right eye 3 (three) times daily.  1  . hydrochlorothiazide (HYDRODIURIL) 25 MG tablet Take 1 tablet (25 mg total) by mouth daily as needed. 30 tablet 0  . hydrOXYzine (ATARAX/VISTARIL) 25 MG tablet 1-2 tabs by mouth three times per day as needed for itching 180 tablet 11  . omeprazole (PRILOSEC) 20 MG capsule Take 1 capsule (20 mg total) by mouth daily. 90 capsule 3  . prednisoLONE acetate (PRED FORTE) 1 % ophthalmic suspension Place 1 drop into the right eye 4 (four) times daily.  2  . simvastatin (ZOCOR) 20 MG tablet Take 1 tablet (20 mg total) by mouth at bedtime. 90 tablet 3  . albuterol (PROAIR HFA) 108 (90 BASE) MCG/ACT inhaler Inhale 2 puffs into the lungs every 6 (six) hours as needed for wheezing. (Patient not taking: Reported on 08/24/2014) 1 Inhaler 11  . losartan (COZAAR) 100  MG tablet Take 1 tablet (100 mg total) by mouth daily. (Patient not taking: Reported on 09/28/2014) 90 tablet 3  . naproxen (NAPROSYN) 500 MG tablet Take 1 tablet (500 mg total) by mouth 2 (two) times daily with a meal. (Patient not taking: Reported on 08/24/2014) 60 tablet 3   No current facility-administered medications on file prior to visit.     Review of Systems  Constitutional: Negative for unusual diaphoresis or night sweats HENT: Negative for ringing in ear or discharge Eyes: Negative for double vision or worsening visual disturbance.  Respiratory: Negative for choking and stridor.   Gastrointestinal:  Negative for vomiting or other signifcant bowel change Genitourinary: Negative for hematuria or change in urine volume.  Musculoskeletal: Negative for other MSK pain or swelling Skin: Negative for color change and worsening wound.  Neurological: Negative for tremors and numbness other than noted  Psychiatric/Behavioral: Negative for decreased concentration or agitation other than above       Objective:   Physical Exam BP 134/82 mmHg  Pulse 103  Temp(Src) 97.9 F (36.6 C) (Oral)  Ht 5\' 1"  (1.549 m)  Wt 265 lb (120.203 kg)  BMI 50.10 kg/m2  SpO2 96% VS noted,  Constitutional: Pt appears in no significant distress HENT: Head: NCAT.  Right Ear: External ear normal.  Left Ear: External ear normal.  Eyes: . Pupils are equal, round, and reactive to light. Conjunctivae and EOM are normal Neck: Normal range of motion. Neck supple.  Cardiovascular: Normal rate and regular rhythm.   Pulmonary/Chest: Effort normal and breath sounds without rales or wheezing.  Abd:  Soft, NT, ND, + BS Neurological: Pt is alert. Not confused , motor grossly intact Skin: Skin is warm. No rash, no LE edema Psychiatric: Pt behavior is normal. No agitation. tearful, dysphoric, mod nervous No joint effusions    Assessment & Plan:

## 2014-12-08 NOTE — Patient Instructions (Signed)
Please take all new medication as prescribed - the tramadol  Please continue all other medications as before, and refills have been done if requested.  Please have the pharmacy call with any other refills you may need.  Please continue your efforts at being more active, low cholesterol diet, and weight control.  Please keep your appointments with your specialists as you may have planned  You will be contacted regarding the referral for: Rheumatology  Please go to the LAB in the Basement (turn left off the elevator) for the tests to be done today  You will be contacted by phone if any changes need to be made immediately.  Otherwise, you will receive a letter about your results with an explanation, but please check with MyChart first.  Please remember to sign up for MyChart if you have not done so, as this will be important to you in the future with finding out test results, communicating by private email, and scheduling acute appointments online when needed.

## 2014-12-08 NOTE — Progress Notes (Signed)
Pre visit review using our clinic review tool, if applicable. No additional management support is needed unless otherwise documented below in the visit note. 

## 2014-12-08 NOTE — Telephone Encounter (Signed)
Unfortunately we cannot order without an appropriate reason for coding and billing purposes, and as I have not seen the patient I cannot do this  Our lab has traditionally been a "closed" lab to non [place] physicians, so any lab testing done in this way would be courtesy related, and assoc with increased risk of liability for myself (though low) since I am not responsible for this patients eye related treatment   Thanks Cathlean Cower MD

## 2014-12-09 DIAGNOSIS — H3581 Retinal edema: Secondary | ICD-10-CM | POA: Diagnosis not present

## 2014-12-09 DIAGNOSIS — Z79899 Other long term (current) drug therapy: Secondary | ICD-10-CM | POA: Diagnosis not present

## 2014-12-09 DIAGNOSIS — Z87891 Personal history of nicotine dependence: Secondary | ICD-10-CM | POA: Diagnosis not present

## 2014-12-09 DIAGNOSIS — H2513 Age-related nuclear cataract, bilateral: Secondary | ICD-10-CM | POA: Diagnosis not present

## 2014-12-09 DIAGNOSIS — E119 Type 2 diabetes mellitus without complications: Secondary | ICD-10-CM | POA: Diagnosis not present

## 2014-12-09 DIAGNOSIS — Z880 Allergy status to penicillin: Secondary | ICD-10-CM | POA: Diagnosis not present

## 2014-12-09 DIAGNOSIS — I10 Essential (primary) hypertension: Secondary | ICD-10-CM | POA: Diagnosis not present

## 2014-12-09 DIAGNOSIS — H30033 Focal chorioretinal inflammation, peripheral, bilateral: Secondary | ICD-10-CM | POA: Diagnosis not present

## 2014-12-09 DIAGNOSIS — H35063 Retinal vasculitis, bilateral: Secondary | ICD-10-CM | POA: Insufficient documentation

## 2014-12-09 LAB — ANTI-NUCLEAR AB-TITER (ANA TITER): ANA Titer 1: 1:40 {titer} — ABNORMAL HIGH

## 2014-12-09 LAB — ANA: ANA: POSITIVE — AB

## 2014-12-11 DIAGNOSIS — H20023 Recurrent acute iridocyclitis, bilateral: Secondary | ICD-10-CM | POA: Insufficient documentation

## 2014-12-11 NOTE — Assessment & Plan Note (Signed)
Or optho f/u as planned

## 2014-12-11 NOTE — Assessment & Plan Note (Signed)
Also with hx of iritis, for rheum evaluation labs and rheum referral for further evaluation, fo rtramadol prn,  to f/u any worsening symptoms or concerns

## 2014-12-11 NOTE — Assessment & Plan Note (Signed)
stable overall by history and exam, recent data reviewed with pt, and pt to continue medical treatment as before,  to f/u any worsening symptoms or concerns BP Readings from Last 3 Encounters:  12/08/14 134/82  09/28/14 130/80  08/24/14 128/82

## 2014-12-11 NOTE — Assessment & Plan Note (Signed)
Mild to mod, for antibx course,  to f/u any worsening symptoms or concerns, declines counseling, For lexapro 10 qd, d

## 2014-12-28 DIAGNOSIS — H35063 Retinal vasculitis, bilateral: Secondary | ICD-10-CM | POA: Diagnosis not present

## 2014-12-28 DIAGNOSIS — H30033 Focal chorioretinal inflammation, peripheral, bilateral: Secondary | ICD-10-CM | POA: Diagnosis not present

## 2014-12-28 DIAGNOSIS — H3581 Retinal edema: Secondary | ICD-10-CM | POA: Diagnosis not present

## 2014-12-28 DIAGNOSIS — H2513 Age-related nuclear cataract, bilateral: Secondary | ICD-10-CM | POA: Diagnosis not present

## 2014-12-30 DIAGNOSIS — S82843A Displaced bimalleolar fracture of unspecified lower leg, initial encounter for closed fracture: Secondary | ICD-10-CM | POA: Diagnosis not present

## 2015-01-03 ENCOUNTER — Encounter: Payer: Self-pay | Admitting: Internal Medicine

## 2015-01-24 ENCOUNTER — Ambulatory Visit: Payer: Medicare Other | Admitting: Family Medicine

## 2015-01-24 DIAGNOSIS — M25512 Pain in left shoulder: Secondary | ICD-10-CM | POA: Diagnosis not present

## 2015-01-24 DIAGNOSIS — M65331 Trigger finger, right middle finger: Secondary | ICD-10-CM | POA: Diagnosis not present

## 2015-01-24 DIAGNOSIS — M25562 Pain in left knee: Secondary | ICD-10-CM | POA: Diagnosis not present

## 2015-01-24 DIAGNOSIS — R700 Elevated erythrocyte sedimentation rate: Secondary | ICD-10-CM | POA: Diagnosis not present

## 2015-01-24 DIAGNOSIS — R760 Raised antibody titer: Secondary | ICD-10-CM | POA: Diagnosis not present

## 2015-01-24 DIAGNOSIS — M25511 Pain in right shoulder: Secondary | ICD-10-CM | POA: Diagnosis not present

## 2015-01-25 DIAGNOSIS — M65331 Trigger finger, right middle finger: Secondary | ICD-10-CM | POA: Diagnosis not present

## 2015-01-29 DIAGNOSIS — S82843A Displaced bimalleolar fracture of unspecified lower leg, initial encounter for closed fracture: Secondary | ICD-10-CM | POA: Diagnosis not present

## 2015-02-01 DIAGNOSIS — H35063 Retinal vasculitis, bilateral: Secondary | ICD-10-CM | POA: Diagnosis not present

## 2015-02-01 DIAGNOSIS — H3581 Retinal edema: Secondary | ICD-10-CM | POA: Diagnosis not present

## 2015-02-01 DIAGNOSIS — H2513 Age-related nuclear cataract, bilateral: Secondary | ICD-10-CM | POA: Diagnosis not present

## 2015-02-01 DIAGNOSIS — H30033 Focal chorioretinal inflammation, peripheral, bilateral: Secondary | ICD-10-CM | POA: Diagnosis not present

## 2015-03-01 DIAGNOSIS — S82843A Displaced bimalleolar fracture of unspecified lower leg, initial encounter for closed fracture: Secondary | ICD-10-CM | POA: Diagnosis not present

## 2015-03-15 DIAGNOSIS — H3581 Retinal edema: Secondary | ICD-10-CM | POA: Diagnosis not present

## 2015-03-15 DIAGNOSIS — H35033 Hypertensive retinopathy, bilateral: Secondary | ICD-10-CM | POA: Diagnosis not present

## 2015-03-15 DIAGNOSIS — H2513 Age-related nuclear cataract, bilateral: Secondary | ICD-10-CM | POA: Diagnosis not present

## 2015-03-15 DIAGNOSIS — H35063 Retinal vasculitis, bilateral: Secondary | ICD-10-CM | POA: Diagnosis not present

## 2015-03-15 DIAGNOSIS — H30033 Focal chorioretinal inflammation, peripheral, bilateral: Secondary | ICD-10-CM | POA: Diagnosis not present

## 2015-04-01 DIAGNOSIS — S82843A Displaced bimalleolar fracture of unspecified lower leg, initial encounter for closed fracture: Secondary | ICD-10-CM | POA: Diagnosis not present

## 2015-04-19 DIAGNOSIS — Z5181 Encounter for therapeutic drug level monitoring: Secondary | ICD-10-CM | POA: Diagnosis not present

## 2015-04-19 DIAGNOSIS — H30033 Focal chorioretinal inflammation, peripheral, bilateral: Secondary | ICD-10-CM | POA: Diagnosis not present

## 2015-04-26 DIAGNOSIS — H2513 Age-related nuclear cataract, bilateral: Secondary | ICD-10-CM | POA: Diagnosis not present

## 2015-04-26 DIAGNOSIS — H35033 Hypertensive retinopathy, bilateral: Secondary | ICD-10-CM | POA: Diagnosis not present

## 2015-04-26 DIAGNOSIS — H3581 Retinal edema: Secondary | ICD-10-CM | POA: Diagnosis not present

## 2015-04-26 DIAGNOSIS — H35063 Retinal vasculitis, bilateral: Secondary | ICD-10-CM | POA: Diagnosis not present

## 2015-04-26 DIAGNOSIS — H30033 Focal chorioretinal inflammation, peripheral, bilateral: Secondary | ICD-10-CM | POA: Diagnosis not present

## 2015-05-24 DIAGNOSIS — H30033 Focal chorioretinal inflammation, peripheral, bilateral: Secondary | ICD-10-CM | POA: Diagnosis not present

## 2015-05-24 DIAGNOSIS — H3581 Retinal edema: Secondary | ICD-10-CM | POA: Diagnosis not present

## 2015-05-24 DIAGNOSIS — H35063 Retinal vasculitis, bilateral: Secondary | ICD-10-CM | POA: Diagnosis not present

## 2015-05-24 DIAGNOSIS — H2513 Age-related nuclear cataract, bilateral: Secondary | ICD-10-CM | POA: Diagnosis not present

## 2015-06-14 DIAGNOSIS — H30033 Focal chorioretinal inflammation, peripheral, bilateral: Secondary | ICD-10-CM | POA: Diagnosis not present

## 2015-06-14 DIAGNOSIS — H3581 Retinal edema: Secondary | ICD-10-CM | POA: Diagnosis not present

## 2015-06-14 DIAGNOSIS — H35033 Hypertensive retinopathy, bilateral: Secondary | ICD-10-CM | POA: Diagnosis not present

## 2015-06-14 DIAGNOSIS — H35063 Retinal vasculitis, bilateral: Secondary | ICD-10-CM | POA: Diagnosis not present

## 2015-06-14 DIAGNOSIS — H2513 Age-related nuclear cataract, bilateral: Secondary | ICD-10-CM | POA: Diagnosis not present

## 2015-07-26 DIAGNOSIS — H30033 Focal chorioretinal inflammation, peripheral, bilateral: Secondary | ICD-10-CM | POA: Diagnosis not present

## 2015-07-26 DIAGNOSIS — H3581 Retinal edema: Secondary | ICD-10-CM | POA: Diagnosis not present

## 2015-07-26 DIAGNOSIS — H2513 Age-related nuclear cataract, bilateral: Secondary | ICD-10-CM | POA: Diagnosis not present

## 2015-07-26 DIAGNOSIS — H35063 Retinal vasculitis, bilateral: Secondary | ICD-10-CM | POA: Diagnosis not present

## 2015-09-09 DIAGNOSIS — H30033 Focal chorioretinal inflammation, peripheral, bilateral: Secondary | ICD-10-CM | POA: Diagnosis not present

## 2015-09-09 DIAGNOSIS — H3581 Retinal edema: Secondary | ICD-10-CM | POA: Diagnosis not present

## 2015-09-15 DIAGNOSIS — M1712 Unilateral primary osteoarthritis, left knee: Secondary | ICD-10-CM | POA: Diagnosis not present

## 2015-09-15 DIAGNOSIS — M19012 Primary osteoarthritis, left shoulder: Secondary | ICD-10-CM | POA: Diagnosis not present

## 2015-09-15 DIAGNOSIS — M19011 Primary osteoarthritis, right shoulder: Secondary | ICD-10-CM | POA: Diagnosis not present

## 2015-09-15 DIAGNOSIS — M1711 Unilateral primary osteoarthritis, right knee: Secondary | ICD-10-CM | POA: Diagnosis not present

## 2015-09-22 ENCOUNTER — Other Ambulatory Visit: Payer: Self-pay | Admitting: Internal Medicine

## 2015-09-29 ENCOUNTER — Telehealth: Payer: Self-pay

## 2015-09-29 MED ORDER — TRAMADOL HCL 50 MG PO TABS: 50.0000 mg | ORAL_TABLET | Freq: Three times a day (TID) | ORAL | 2 refills | Status: DC | PRN

## 2015-09-29 NOTE — Telephone Encounter (Signed)
Tramadol - Done hardcopy to [facility] for cologuard - to corinne to handle please

## 2015-09-29 NOTE — Telephone Encounter (Signed)
Patient is requesting a cologuard test for home. She states she had got a call about one. Please advise or follow up.  Patient is also requesting a refill on her tramadol 50mg .

## 2015-10-11 DIAGNOSIS — Z79899 Other long term (current) drug therapy: Secondary | ICD-10-CM | POA: Insufficient documentation

## 2015-10-11 DIAGNOSIS — H3581 Retinal edema: Secondary | ICD-10-CM | POA: Diagnosis not present

## 2015-10-11 DIAGNOSIS — H30033 Focal chorioretinal inflammation, peripheral, bilateral: Secondary | ICD-10-CM | POA: Diagnosis not present

## 2015-10-11 DIAGNOSIS — H35063 Retinal vasculitis, bilateral: Secondary | ICD-10-CM | POA: Diagnosis not present

## 2015-10-11 DIAGNOSIS — H2513 Age-related nuclear cataract, bilateral: Secondary | ICD-10-CM | POA: Diagnosis not present

## 2015-10-11 DIAGNOSIS — H35033 Hypertensive retinopathy, bilateral: Secondary | ICD-10-CM | POA: Diagnosis not present

## 2015-10-13 ENCOUNTER — Telehealth: Payer: Self-pay | Admitting: Internal Medicine

## 2015-10-13 NOTE — Telephone Encounter (Signed)
No, should be fine to take both, thanks

## 2015-10-13 NOTE — Telephone Encounter (Signed)
Pt request to speak to the assistant (only) concern medication that is not working. Please call her back

## 2015-10-13 NOTE — Telephone Encounter (Signed)
Patient states that she is on a statin and an eye drop. Pharmacy told her that the two medications would interact.

## 2015-10-14 NOTE — Telephone Encounter (Signed)
Patient aware.

## 2015-10-27 DIAGNOSIS — M1712 Unilateral primary osteoarthritis, left knee: Secondary | ICD-10-CM | POA: Diagnosis not present

## 2015-10-27 DIAGNOSIS — M1711 Unilateral primary osteoarthritis, right knee: Secondary | ICD-10-CM | POA: Diagnosis not present

## 2015-11-07 ENCOUNTER — Encounter: Payer: Medicare Other | Admitting: Internal Medicine

## 2015-11-10 ENCOUNTER — Other Ambulatory Visit: Payer: Self-pay | Admitting: Internal Medicine

## 2015-11-10 DIAGNOSIS — R609 Edema, unspecified: Secondary | ICD-10-CM

## 2015-11-11 MED ORDER — HYDROCHLOROTHIAZIDE 25 MG PO TABS: 25.0000 mg | ORAL_TABLET | Freq: Every day | ORAL | 2 refills | Status: DC | PRN

## 2015-11-12 ENCOUNTER — Other Ambulatory Visit: Payer: Self-pay | Admitting: Internal Medicine

## 2015-11-15 DIAGNOSIS — M1711 Unilateral primary osteoarthritis, right knee: Secondary | ICD-10-CM | POA: Diagnosis not present

## 2015-11-15 DIAGNOSIS — M67911 Unspecified disorder of synovium and tendon, right shoulder: Secondary | ICD-10-CM | POA: Diagnosis not present

## 2015-11-15 DIAGNOSIS — M25561 Pain in right knee: Secondary | ICD-10-CM | POA: Diagnosis not present

## 2015-11-15 DIAGNOSIS — M67912 Unspecified disorder of synovium and tendon, left shoulder: Secondary | ICD-10-CM | POA: Diagnosis not present

## 2015-11-22 DIAGNOSIS — M1711 Unilateral primary osteoarthritis, right knee: Secondary | ICD-10-CM | POA: Diagnosis not present

## 2015-11-28 DIAGNOSIS — N951 Menopausal and female climacteric states: Secondary | ICD-10-CM | POA: Diagnosis not present

## 2015-11-29 ENCOUNTER — Ambulatory Visit (INDEPENDENT_AMBULATORY_CARE_PROVIDER_SITE_OTHER): Payer: Medicare Other | Admitting: Pulmonary Disease

## 2015-11-29 ENCOUNTER — Telehealth: Payer: Self-pay

## 2015-11-29 ENCOUNTER — Encounter: Payer: Self-pay | Admitting: Pulmonary Disease

## 2015-11-29 DIAGNOSIS — G4733 Obstructive sleep apnea (adult) (pediatric): Secondary | ICD-10-CM | POA: Diagnosis not present

## 2015-11-29 DIAGNOSIS — G478 Other sleep disorders: Secondary | ICD-10-CM | POA: Diagnosis not present

## 2015-11-29 DIAGNOSIS — M1711 Unilateral primary osteoarthritis, right knee: Secondary | ICD-10-CM | POA: Diagnosis not present

## 2015-11-29 NOTE — Telephone Encounter (Signed)
LMOM stating to Pt. That I need her to take her CPAP to [facility] so we are able to get a download on it.

## 2015-11-29 NOTE — Assessment & Plan Note (Signed)
Weight reduction 

## 2015-11-29 NOTE — Patient Instructions (Signed)
  It was a pleasure taking care of you today!  Continue using your CPAP machine.  We will try to get a download for the last month. We will also order you supplies.  Please make sure you use your CPAP device everytime you sleep.  We will monitor the usage of your machine per your insurance requirement.  Your insurance company may take the machine from you if you are not using it regularly.   Please clean the mask, tubings, filter, water reservoir with soapy water every week.  Please use distilled water for the water reservoir.   Please call the office or your machine provider (DME company) if you are having issues with the device.   Return to clinic in 2-3 months.

## 2015-11-29 NOTE — Progress Notes (Signed)
Subjective:    Patient ID: Betty Green, female    DOB: [birth year], 70 y.o.   MRN: [id number]  HPI   This is the case of Betty Green, 70 y.o. Female, who was referred by Dr. Cathlean Cower in consultation regarding OSA.   As you very well know, patient is a non smoker, not known to have asthma or copd. She was diagnosed with severe sleep apnea in 2013. AHI of 37. Has been on cpap 12 cm water.  She used cpap for 1-2 yrs and felt better.  She fell through the cracks and stopped using the CPAP.  She gained 20 lbs over 1 yr 2/2 prednisone for iritis.  With the weight gain, her symptoms have gotten worse. She has more hypersomnia, snoring, gasping, choking. She started using her CPAP the last couple months. She feels better but not as well.  She was concerned about whether the current pressure is corrected.  Patient has sleep paralysis since 1963. Chronic. With the weight gain, it has gotten more frequent. Since using the CPAP therapy, it's better. Denies diabetes, renal issues, chronic pain. It does not necessarily interrupt her sleep.   Review of Systems  Constitutional: Positive for unexpected weight change. Negative for fever.  HENT: Positive for congestion, sinus pressure and sneezing. Negative for dental problem, ear pain, nosebleeds, postnasal drip, rhinorrhea, sore throat and trouble swallowing.   Eyes: Positive for redness and itching.  Respiratory: Positive for shortness of breath and wheezing. Negative for cough and chest tightness.   Cardiovascular: Positive for leg swelling. Negative for palpitations.  Gastrointestinal: Negative.  Negative for nausea and vomiting.  Endocrine: Negative.   Genitourinary: Negative.  Negative for dysuria.  Musculoskeletal: Positive for joint swelling.  Skin: Positive for rash.  Allergic/Immunologic: Positive for environmental allergies.  Neurological: Negative.  Negative for headaches.  Hematological: Negative.  Does not bruise/bleed easily.    Psychiatric/Behavioral: Negative.  Negative for dysphoric mood. The patient is not nervous/anxious.    Past Medical History:  Diagnosis Date  . Allergy   . Anxiety    "take RX prn"  . Arthritis    "all over"  . Asthma   . Chronic bronchitis ([place])   . GERD (gastroesophageal reflux disease)   . Hypertension   . Insomnia 05/31/2011  . OSA on CPAP    wears CPAP sometimes (06/23/2014)  . Other and unspecified hyperlipidemia 08/04/2012  . PONV (postoperative nausea and vomiting)   . Shortness of breath dyspnea    with exertion  . Shoulder pain, bilateral 10/18/2010   (-) CA, DVT  Family History  Problem Relation Age of Onset  . Arthritis Mother   . Heart disease Mother   . Hypertension Mother   . Arthritis Father   . Heart disease Father   . Hypertension Father   . Arthritis Other   . Cancer Other     colon and prostate cancer     Past Surgical History:  Procedure Laterality Date  . APPENDECTOMY  1966  . CHOLECYSTECTOMY OPEN  1981  . FRACTURE SURGERY    . ORIF ANKLE FRACTURE Right 06/23/2014  . ORIF ANKLE FRACTURE Right 06/23/2014   Procedure: Take Down [place] Right Ankle, Open Reduction Internal Fixation Right Ankle, Possible Syndesmosis Repair, Medial Joint Debridement;  Surgeon: Newt Minion, MD;  Location: [facility];  Service: Orthopedics;  Laterality: Right;  . TONSILLECTOMY  1963  . TOTAL ABDOMINAL HYSTERECTOMY  1980   w/BSO    Social History  Social History  . Marital status: Single    Spouse name: N/A  . Number of children: 2  . Years of education: 12   Occupational History  . retired [employer]   Social History Main Topics  . Smoking status: Former Smoker    Packs/day: 0.10    Years: 2.00    Types: Cigarettes    Quit date: 02/06/1984  . Smokeless tobacco: Never Used  . Alcohol use No  . Drug use: No  . Sexual activity: Not Currently   Other Topics Concern  . Not on file   Social History Narrative  . No narrative on file   Single,  stays with sister. 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 (-) ETOH.   Allergies  Allergen Reactions  . Amoxicillin-Pot Clavulanate     Hives  . Azithromycin Other (See Comments)    abd pain  . Lipitor [Atorvastatin] Itching     Outpatient Medications Prior to Visit  Medication Sig Dispense Refill  . hydrochlorothiazide (HYDRODIURIL) 25 MG tablet Take 1 tablet (25 mg total) by mouth daily as needed. 30 tablet 2  . hydrOXYzine (ATARAX/VISTARIL) 25 MG tablet TAKE 1-2 TABS BY MOUTH THREE TIMES PER DAY AS NEEDED FOR ITCHING 180 tablet 1  . ILEVRO 0.3 % ophthalmic suspension PLACE 1 DROP INTO BOTH EYES TWICE A DAY  0  . losartan (COZAAR) 100 MG tablet Take 1 tablet (100 mg total) by mouth daily. 90 tablet 3  . simvastatin (ZOCOR) 20 MG tablet TAKE 1 TABLET BY MOUTH AT BEDTIME 90 tablet 2  . traMADol (ULTRAM) 50 MG tablet Take 1 tablet (50 mg total) by mouth every 8 (eight) hours as needed. 120 tablet 2  . albuterol (PROAIR HFA) 108 (90 BASE) MCG/ACT inhaler Inhale 2 puffs into the lungs every 6 (six) hours as needed for wheezing. (Patient not taking: Reported on 11/29/2015) 1 Inhaler 11  . naproxen (NAPROSYN) 500 MG tablet Take 1 tablet (500 mg total) by mouth 2 (two) times daily with a meal. (Patient not taking: Reported on 11/29/2015) 60 tablet 3  . atropine 1 % ophthalmic solution Place 1 drop into the right eye 3 (three) times daily.  1  . DUREZOL 0.05 % EMUL INSTILL 1 DROP INTO BOTH EYES FOUR TIMES A DAY  0  . omeprazole (PRILOSEC) 20 MG capsule Take 1 capsule (20 mg total) by mouth daily. (Patient not taking: Reported on 11/29/2015) 90 capsule 3  . prednisoLONE acetate (PRED FORTE) 1 % ophthalmic suspension Place 1 drop into the right eye 4 (four) times daily.  2   No facility-administered medications prior to visit.    Meds ordered this encounter  Medications  . cycloSPORINE modified (NEORAL) 50 MG capsule    Sig: Take 100 mg by mouth.  . folic acid (FOLVITE) 1 MG tablet    Sig: Take 1 mg by  mouth.  . methotrexate (RHEUMATREX) 2.5 MG tablet    Sig: Take 25 mg by mouth.        Objective:   Physical Exam  Vitals:  Vitals:   11/29/15 1133  BP: 126/84  Pulse: (!) 104  SpO2: 99%  Weight: 279 lb 3.2 oz (126.6 kg)  Height: 5\' 1"  (1.549 m)    Constitutional/General:  Pleasant, well-nourished, well-developed, not in any distress,  Comfortably seating.  Well kempt  Body mass index is 52.75 kg/m. Wt Readings from Last 3 Encounters:  11/29/15 279 lb 3.2 oz (126.6 kg)  12/08/14 265 lb (120.2 kg)  09/28/14 261  lb (118.4 kg)                                                                                                                                                                                                                                                                                                                                                                                                                                                           HEENT: Pupils equal and reactive to light and accommodation. Anicteric sclerae. Normal nasal mucosa.   No oral  lesions,  mouth clear,  oropharynx clear, no postnasal drip. (-) Oral thrush. No dental caries.  Airway - Mallampati class III-IV, large tongue  Neck: No masses. Midline trachea. No JVD, (-) LAD. (-) bruits appreciated.  Respiratory/Chest: Grossly normal chest. (-) deformity. (-) Accessory muscle use.  Symmetric expansion. (-) Tenderness on palpation.  Resonant on percussion.  Diminished BS on both lower lung zones. (-) wheezing, crackles, rhonchi (-) egophony  Cardiovascular: Regular rate and  rhythm, heart sounds normal, no murmur or gallops, no peripheral edema  Gastrointestinal:  Normal bowel sounds. Soft, non-tender. No hepatosplenomegaly.  (-) masses.   Musculoskeletal:  Normal muscle tone. Normal gait.   Extremities: Grossly normal. (-) clubbing, cyanosis.  (-) edema  Skin: (-) rash,lesions  seen.   Neurological/Psychiatric : alert, oriented to  time, place, person. Normal mood and affect          Assessment & Plan:  Obstructive sleep apnea NPSG 07/17/11- AHI 37/ hr, CPAP 12/ AHI 3.2/ hr  She was diagnosed with severe sleep apnea in 2013. AHI of 37. Has been on cpap 12 cm water.  She used cpap for 1-2 yrs and felt better.  She fell through the cracks and stopped using the CPAP.  She gained 20 lbs over 1 yr 2/2 prednisone for iritis.  With the weight gain, her symptoms have gotten worse. She has more hypersomnia, snoring, gasping, choking. She started using her CPAP the last couple months. She feels better but not as well.  She was concerned about whether the current pressure is corrected.  Plan:   We extensively discussed the diagnosis, pathophysiology, and treatment options for Obstructive Sleep Apnea (OSA).  We discussed treatment options for OSA including CPAP, BiPaP, as well as surgical options and oral devices.   Continue current CPAP therapy. By history, she is on CPAP 12 cm water. We need to get a download and determine compliance and best CPAP pressure. Will order supplies as well.   Patient was instructed to call the office if he/she has not received the PAP device in 1-2 weeks.  Patient was instructed to have mask, tubings, filter, reservoir cleaned at least once a week with soapy water.  Patient was instructed to call the office if he/she is having issues with the PAP device.    I advised patient to obtain sufficient amount of sleep --  7 to 8 hours at least in a 24 hr period.  Patient was advised to follow good sleep hygiene.  Patient was advised NOT to engage in activities requiring concentration and/or vigilance if he/she is and  sleepy.  Patient is NOT to drive if he/she is sleepy.        Sleep paralysis Patient has sleep paralysis since 1963. Chronic. With the weight gain, it has gotten more frequent. Since using the CPAP therapy, it's better.  Denies diabetes, renal issues, chronic pain. It does not necessarily interrupt her sleep.  Plan : 1. Make sure pt is compliant with OSA and OSA is corrected.  2. If sleep paralysis is persistent, will need labs to R/O secondary causes.  May try horizant. For possible RLS.  3. Ensure adequate sleep.   Morbid obesity Weight reduction     Thank you very much for letting me participate in this patient's care. Please do not hesitate to give me a call if you have any questions or concerns regarding the treatment plan.   Patient will follow up with me in 6-8 weeks.     Monica Becton, MD 11/29/2015   12:48 PM Pul[facility] and [place] Pager: [phone] Office: [phone], Fax: [phone]

## 2015-11-29 NOTE — Assessment & Plan Note (Signed)
Patient has sleep paralysis since 1963. Chronic. With the weight gain, it has gotten more frequent. Since using the CPAP therapy, it's better. Denies diabetes, renal issues, chronic pain. It does not necessarily interrupt her sleep.  Plan : 1. Make sure pt is compliant with OSA and OSA is corrected.  2. If sleep paralysis is persistent, will need labs to R/O secondary causes.  May try horizant. For possible RLS.  3. Ensure adequate sleep.

## 2015-11-29 NOTE — Assessment & Plan Note (Addendum)
NPSG 07/17/11- AHI 37/ hr, CPAP 12/ AHI 3.2/ hr  She was diagnosed with severe sleep apnea in 2013. AHI of 37. Has been on cpap 12 cm water.  She used cpap for 1-2 yrs and felt better.  She fell through the cracks and stopped using the CPAP.  She gained 20 lbs over 1 yr 2/2 prednisone for iritis.  With the weight gain, her symptoms have gotten worse. She has more hypersomnia, snoring, gasping, choking. She started using her CPAP the last couple months. She feels better but not as well.  She was concerned about whether the current pressure is corrected.  Plan:   We extensively discussed the diagnosis, pathophysiology, and treatment options for Obstructive Sleep Apnea (OSA).  We discussed treatment options for OSA including CPAP, BiPaP, as well as surgical options and oral devices.   Continue current CPAP therapy. By history, she is on CPAP 12 cm water. We need to get a download and determine compliance and best CPAP pressure. Will order supplies as well.   Patient was instructed to call the office if he/she has not received the PAP device in 1-2 weeks.  Patient was instructed to have mask, tubings, filter, reservoir cleaned at least once a week with soapy water.  Patient was instructed to call the office if he/she is having issues with the PAP device.    I advised patient to obtain sufficient amount of sleep --  7 to 8 hours at least in a 24 hr period.  Patient was advised to follow good sleep hygiene.  Patient was advised NOT to engage in activities requiring concentration and/or vigilance if he/she is and  sleepy.  Patient is NOT to drive if he/she is sleepy.

## 2015-12-06 DIAGNOSIS — R7303 Prediabetes: Secondary | ICD-10-CM | POA: Diagnosis not present

## 2015-12-06 DIAGNOSIS — M1711 Unilateral primary osteoarthritis, right knee: Secondary | ICD-10-CM | POA: Diagnosis not present

## 2015-12-06 DIAGNOSIS — H2513 Age-related nuclear cataract, bilateral: Secondary | ICD-10-CM | POA: Diagnosis not present

## 2015-12-06 DIAGNOSIS — H35063 Retinal vasculitis, bilateral: Secondary | ICD-10-CM | POA: Diagnosis not present

## 2015-12-06 DIAGNOSIS — H35033 Hypertensive retinopathy, bilateral: Secondary | ICD-10-CM | POA: Diagnosis not present

## 2015-12-06 DIAGNOSIS — H3581 Retinal edema: Secondary | ICD-10-CM | POA: Diagnosis not present

## 2015-12-06 DIAGNOSIS — H30033 Focal chorioretinal inflammation, peripheral, bilateral: Secondary | ICD-10-CM | POA: Diagnosis not present

## 2015-12-06 DIAGNOSIS — I10 Essential (primary) hypertension: Secondary | ICD-10-CM | POA: Diagnosis not present

## 2015-12-06 DIAGNOSIS — E538 Deficiency of other specified B group vitamins: Secondary | ICD-10-CM | POA: Diagnosis not present

## 2015-12-06 DIAGNOSIS — E559 Vitamin D deficiency, unspecified: Secondary | ICD-10-CM | POA: Diagnosis not present

## 2015-12-07 ENCOUNTER — Other Ambulatory Visit: Payer: Self-pay

## 2015-12-07 DIAGNOSIS — G4733 Obstructive sleep apnea (adult) (pediatric): Secondary | ICD-10-CM

## 2015-12-13 DIAGNOSIS — M1711 Unilateral primary osteoarthritis, right knee: Secondary | ICD-10-CM | POA: Diagnosis not present

## 2015-12-14 ENCOUNTER — Ambulatory Visit (INDEPENDENT_AMBULATORY_CARE_PROVIDER_SITE_OTHER)
Admission: RE | Admit: 2015-12-14 | Discharge: 2015-12-14 | Disposition: A | Discharge status: Home / Self Care | Payer: Medicare Other | Source: Ambulatory Visit | Attending: Internal Medicine | Admitting: Internal Medicine

## 2015-12-14 ENCOUNTER — Encounter: Payer: Self-pay | Admitting: Internal Medicine

## 2015-12-14 ENCOUNTER — Ambulatory Visit (INDEPENDENT_AMBULATORY_CARE_PROVIDER_SITE_OTHER): Payer: Medicare Other | Admitting: Internal Medicine

## 2015-12-14 VITALS — BP 130/70 | HR 106 | Temp 98.6°F | Resp 20 | Wt 281.0 lb

## 2015-12-14 DIAGNOSIS — R0600 Dyspnea, unspecified: Secondary | ICD-10-CM | POA: Diagnosis not present

## 2015-12-14 DIAGNOSIS — R739 Hyperglycemia, unspecified: Secondary | ICD-10-CM | POA: Diagnosis not present

## 2015-12-14 DIAGNOSIS — Z0000 Encounter for general adult medical examination without abnormal findings: Secondary | ICD-10-CM | POA: Diagnosis not present

## 2015-12-14 DIAGNOSIS — I10 Essential (primary) hypertension: Secondary | ICD-10-CM

## 2015-12-14 DIAGNOSIS — R7303 Prediabetes: Secondary | ICD-10-CM | POA: Insufficient documentation

## 2015-12-14 DIAGNOSIS — Z1159 Encounter for screening for other viral diseases: Secondary | ICD-10-CM

## 2015-12-14 DIAGNOSIS — Z23 Encounter for immunization: Secondary | ICD-10-CM | POA: Diagnosis not present

## 2015-12-14 DIAGNOSIS — J019 Acute sinusitis, unspecified: Secondary | ICD-10-CM | POA: Diagnosis not present

## 2015-12-14 DIAGNOSIS — H209 Unspecified iridocyclitis: Secondary | ICD-10-CM | POA: Insufficient documentation

## 2015-12-14 DIAGNOSIS — Z0001 Encounter for general adult medical examination with abnormal findings: Secondary | ICD-10-CM

## 2015-12-14 DIAGNOSIS — I517 Cardiomegaly: Secondary | ICD-10-CM | POA: Diagnosis not present

## 2015-12-14 MED ORDER — LEVOFLOXACIN 500 MG PO TABS: 500.0000 mg | ORAL_TABLET | Freq: Every day | ORAL | 0 refills | Status: AC

## 2015-12-14 NOTE — Patient Instructions (Addendum)
You had the flu shot today  Please take all new medication as prescribed - the antibiotic  You will be contacted regarding the referral for: Cologuard  Please continue all other medications as before, and refills have been done if requested.  Please have the pharmacy call with any other refills you may need.  Please continue your efforts at being more active, low cholesterol diet, and weight control.  You are otherwise up to date with prevention measures today.  Please keep your appointments with your specialists as you may have planned  Please have your blood work forwarded from the [facility] when you can.    Please go to the XRAY Department in the Basement (go straight as you get off the elevator) for the x-ray testing  You will be contacted by phone if any changes need to be made immediately.  Otherwise, you will receive a letter about your results with an explanation, but please check with MyChart first.  Please remember to sign up for MyChart if you have not done so, as this will be important to you in the future with finding out test results, communicating by private email, and scheduling acute appointments online when needed.  Please return in 1 year for your yearly visit, or sooner if needed, with Lab testing done 3-5 days before

## 2015-12-14 NOTE — Progress Notes (Signed)
Pre visit review using our clinic review tool, if applicable. No additional management support is needed unless otherwise documented below in the visit note. 

## 2015-12-14 NOTE — Progress Notes (Signed)
Subjective:    Patient ID: Betty Green, female    DOB: 1945/11/18, 70 y.o.   MRN: [id number]  HPI  Here for wellness and f/u;  Overall doing ok;  Pt denies Chest pain, wheezing, orthopnea, PND, worsening LE edema, palpitations, dizziness or syncope.  Pt denies neurological change such as new headache, facial or extremity weakness.  Pt denies polydipsia, polyuria, or low sugar symptoms. Pt states overall good compliance with treatment and medications, good tolerability, and has been trying to follow appropriate diet.  Pt denies worsening depressive symptoms, suicidal ideation or panic. No fever, night sweats, wt loss, loss of appetite, or other constitutional symptoms.  Pt states good ability with ADL's, has low fall risk, home safety reviewed and adequate, no other significant changes in hearing or vision, and only occasionally active with exercise.    Was just seen by a local wt loss center where ecg and labs done, and she recall she was told kidneys and liver were good.  She will try to get results forwarded.   Had gained wt in the past yr, has worsening knee pain, sees Dr Berenice Primas for knee injections, needs to lose wt 40 lbs to get to do the knee replacement  Also has known right rot cuff tear, but has not decided on surgury for that yet.  Has worsening dyspnea and sob for unclearw reasons, gradual for several months Also,  Here with 2-3 days acute onset fever, facial pain, pressure, headache, general weakness and malaise, and greenish d/c, with mild ST and cough.  Wt has increased Wt Readings from Last 3 Encounters:  12/14/15 281 lb (127.5 kg)  11/29/15 279 lb 3.2 oz (126.6 kg)  12/08/14 265 lb (120.2 kg)  Pt prefers cologuard over colonoscopy Past Medical History:  Diagnosis Date  . Allergy   . Anxiety    "take RX prn"  . Arthritis    "all over"  . Asthma   . Chronic bronchitis ([place])   . GERD (gastroesophageal reflux disease)   . Hypertension   . Insomnia 05/31/2011  . OSA on CPAP    wears CPAP sometimes (06/23/2014)  . Other and unspecified hyperlipidemia 08/04/2012  . PONV (postoperative nausea and vomiting)   . Shortness of breath dyspnea    with exertion  . Shoulder pain, bilateral 10/18/2010   Past Surgical History:  Procedure Laterality Date  . APPENDECTOMY  1966  . CHOLECYSTECTOMY OPEN  1981  . FRACTURE SURGERY    . ORIF ANKLE FRACTURE Right 06/23/2014  . ORIF ANKLE FRACTURE Right 06/23/2014   Procedure: Take Down Malunion Right Ankle, Open Reduction Internal Fixation Right Ankle, Possible Syndesmosis Repair, Medial Joint Debridement;  Surgeon: Newt Minion, MD;  Location: [facility];  Service: Orthopedics;  Laterality: Right;  . TONSILLECTOMY  1963  . TOTAL ABDOMINAL HYSTERECTOMY  1980   w/BSO    reports that she quit smoking about 31 years ago. Her smoking use included Cigarettes. She has a 0.20 pack-year smoking history. She has never used smokeless tobacco. She reports that she does not drink alcohol or use drugs. family history includes Arthritis in her father, mother, and other; Cancer in her other; Heart disease in her father and mother; Hypertension in her father and mother. Allergies  Allergen Reactions  . Amoxicillin-Pot Clavulanate     Hives  . Azithromycin Other (See Comments)    abd pain  . Lipitor [Atorvastatin] Itching   Current Outpatient Prescriptions on File Prior to Visit  Medication Sig Dispense  Refill  . albuterol (PROAIR HFA) 108 (90 BASE) MCG/ACT inhaler Inhale 2 puffs into the lungs every 6 (six) hours as needed for wheezing. 1 Inhaler 11  . cycloSPORINE modified (NEORAL) 50 MG capsule Take 100 mg by mouth.    . folic acid (FOLVITE) 1 MG tablet Take 1 mg by mouth.    . hydrochlorothiazide (HYDRODIURIL) 25 MG tablet Take 1 tablet (25 mg total) by mouth daily as needed. 30 tablet 2  . hydrOXYzine (ATARAX/VISTARIL) 25 MG tablet TAKE 1-2 TABS BY MOUTH THREE TIMES PER DAY AS NEEDED FOR ITCHING 180 tablet 1  . ILEVRO 0.3 % ophthalmic suspension  PLACE 1 DROP INTO BOTH EYES TWICE A DAY  0  . losartan (COZAAR) 100 MG tablet Take 1 tablet (100 mg total) by mouth daily. 90 tablet 3  . methotrexate (RHEUMATREX) 2.5 MG tablet Take 25 mg by mouth.    . simvastatin (ZOCOR) 20 MG tablet TAKE 1 TABLET BY MOUTH AT BEDTIME 90 tablet 2  . traMADol (ULTRAM) 50 MG tablet Take 1 tablet (50 mg total) by mouth every 8 (eight) hours as needed. 120 tablet 2   No current facility-administered medications on file prior to visit.    Review of Systems Constitutional: Negative for increased diaphoresis, or other activity, appetite or siginficant weight change other than noted HENT: Negative for worsening hearing loss, ear pain, facial swelling, mouth sores and neck stiffness.   Eyes: Negative for other worsening pain, redness or visual disturbance.  Respiratory: Negative for choking or stridor Cardiovascular: Negative for other chest pain and palpitations.  Gastrointestinal: Negative for worsening diarrhea, blood in stool, or abdominal distention Genitourinary: Negative for hematuria, flank pain or change in urine volume.  Musculoskeletal: Negative for myalgias or other joint complaints.  Skin: Negative for other color change and wound or drainage.  Neurological: Negative for syncope and numbness. other than noted Hematological: Negative for adenopathy. or other swelling Psychiatric/Behavioral: Negative for hallucinations, SI, self-injury, decreased concentration or other worsening agitation.  All other system neg per pt    Objective:   Physical Exam BP 130/70   Pulse (!) 106   Temp 98.6 F (37 C) (Oral)   Resp 20   Wt 281 lb (127.5 kg)   SpO2 94%   BMI 53.09 kg/m  VS noted, morbid obesity, mild ill Constitutional: Pt is oriented to person, place, and time. Appears well-developed and well-nourished, in no significant distress Head: Normocephalic and atraumatic  Eyes: Conjunctivae and EOM are normal. Pupils are equal, round, and reactive to  light Right Ear: External ear normal.  Left Ear: External ear normal Nose: Nose normal.  Bilat tm's with mild erythema.  Max sinus areas mild tender.  Pharynx with mild erythema, no exudate Mouth/Throat: Oropharynx is clear and moist  Neck: Normal range of motion. Neck supple. No JVD present. No tracheal deviation present or significant neck LA or mass Cardiovascular: Normal rate, regular rhythm, normal heart sounds and intact distal pulses.   Pulmonary/Chest: Effort normal and breath sounds decreased without rales or wheezing  Abdominal: Soft. Bowel sounds are normal. NT. No HSM  Musculoskeletal: Normal range of motion. Exhibits no edema Lymphadenopathy: Has no cervical adenopathy.  Bilat knee crepitus right > left Neurological: Pt is alert and oriented to person, place, and time. Pt has normal reflexes. No cranial nerve deficit. Motor grossly intact Skin: Skin is warm and dry. No rash noted or new ulcers Psychiatric:  Has normal mood and affect. Behavior is normal.  Assessment & Plan:

## 2015-12-15 ENCOUNTER — Telehealth: Payer: Self-pay | Admitting: Internal Medicine

## 2015-12-15 NOTE — Telephone Encounter (Signed)
Rec'd from [place] forward 5 pages to Dr. Jenny Reichmann

## 2015-12-18 NOTE — Assessment & Plan Note (Signed)
stable overall by history and exam, recent data reviewed with pt, and pt to continue medical treatment as before,  to f/u any worsening symptoms or concerns BP Readings from Last 3 Encounters:  12/14/15 130/70  11/29/15 126/84  12/08/14 134/82

## 2015-12-18 NOTE — Assessment & Plan Note (Signed)
Mild to mod, for antibx course,  to f/u any worsening symptoms or concerns 

## 2015-12-18 NOTE — Assessment & Plan Note (Signed)

## 2015-12-18 NOTE — Assessment & Plan Note (Signed)
stable overall by history and exam, recent data reviewed with pt, and pt to continue medical treatment as before,  to f/u any worsening symptoms or concerns Lab Results  Component Value Date   HGBA1C 5.6 08/24/2014

## 2015-12-18 NOTE — Assessment & Plan Note (Addendum)
Etiology unclear, exam benign, suspect deconditioning, for cxr as well today  In addition to the time spent performing CPE, I spent an additional 15 minutes face to face,in which greater than 50% of this time was spent in counseling and coordination of care for patient's illness as documented.

## 2015-12-20 DIAGNOSIS — I10 Essential (primary) hypertension: Secondary | ICD-10-CM | POA: Diagnosis not present

## 2015-12-20 DIAGNOSIS — R7303 Prediabetes: Secondary | ICD-10-CM | POA: Diagnosis not present

## 2015-12-20 DIAGNOSIS — E538 Deficiency of other specified B group vitamins: Secondary | ICD-10-CM | POA: Diagnosis not present

## 2015-12-21 ENCOUNTER — Encounter: Payer: Self-pay | Admitting: Internal Medicine

## 2015-12-21 DIAGNOSIS — E559 Vitamin D deficiency, unspecified: Secondary | ICD-10-CM | POA: Insufficient documentation

## 2015-12-21 HISTORY — DX: Vitamin D deficiency, unspecified: E55.9

## 2015-12-28 ENCOUNTER — Telehealth: Payer: Self-pay

## 2015-12-28 MED ORDER — LOSARTAN POTASSIUM 100 MG PO TABS: 100.0000 mg | ORAL_TABLET | Freq: Every day | ORAL | 3 refills | Status: DC

## 2015-12-28 NOTE — Telephone Encounter (Signed)
Pt called requesting a refill of Losartan, Rx sent

## 2016-01-03 DIAGNOSIS — E538 Deficiency of other specified B group vitamins: Secondary | ICD-10-CM | POA: Diagnosis not present

## 2016-01-03 DIAGNOSIS — R7303 Prediabetes: Secondary | ICD-10-CM | POA: Diagnosis not present

## 2016-01-03 DIAGNOSIS — I10 Essential (primary) hypertension: Secondary | ICD-10-CM | POA: Diagnosis not present

## 2016-01-13 ENCOUNTER — Other Ambulatory Visit: Payer: Self-pay | Admitting: *Deleted

## 2016-01-13 DIAGNOSIS — R609 Edema, unspecified: Secondary | ICD-10-CM

## 2016-01-13 MED ORDER — HYDROCHLOROTHIAZIDE 25 MG PO TABS: 25.0000 mg | ORAL_TABLET | Freq: Every day | ORAL | 0 refills | Status: DC | PRN

## 2016-01-17 DIAGNOSIS — R7303 Prediabetes: Secondary | ICD-10-CM | POA: Diagnosis not present

## 2016-01-17 DIAGNOSIS — I10 Essential (primary) hypertension: Secondary | ICD-10-CM | POA: Diagnosis not present

## 2016-01-17 DIAGNOSIS — E538 Deficiency of other specified B group vitamins: Secondary | ICD-10-CM | POA: Diagnosis not present

## 2016-01-19 DIAGNOSIS — H30033 Focal chorioretinal inflammation, peripheral, bilateral: Secondary | ICD-10-CM | POA: Diagnosis not present

## 2016-01-19 DIAGNOSIS — H3581 Retinal edema: Secondary | ICD-10-CM | POA: Diagnosis not present

## 2016-01-24 ENCOUNTER — Other Ambulatory Visit: Payer: Self-pay | Admitting: Internal Medicine

## 2016-01-31 ENCOUNTER — Encounter: Payer: Self-pay | Admitting: Internal Medicine

## 2016-01-31 ENCOUNTER — Ambulatory Visit (INDEPENDENT_AMBULATORY_CARE_PROVIDER_SITE_OTHER): Payer: Medicare Other | Admitting: Internal Medicine

## 2016-01-31 VITALS — BP 130/80 | HR 100 | Temp 98.4°F | Resp 20 | Wt 288.0 lb

## 2016-01-31 DIAGNOSIS — R739 Hyperglycemia, unspecified: Secondary | ICD-10-CM

## 2016-01-31 DIAGNOSIS — R609 Edema, unspecified: Secondary | ICD-10-CM | POA: Diagnosis not present

## 2016-01-31 DIAGNOSIS — I10 Essential (primary) hypertension: Secondary | ICD-10-CM | POA: Diagnosis not present

## 2016-01-31 MED ORDER — HYDROCHLOROTHIAZIDE 25 MG PO TABS: 25.0000 mg | ORAL_TABLET | Freq: Every day | ORAL | 3 refills | Status: DC

## 2016-01-31 NOTE — Patient Instructions (Signed)
Please take all new medication as prescribed - the fluid pill every day  Please continue all other medications as before, and refills have been done if requested.  Please have the pharmacy call with any other refills you may need.  Please continue your efforts at being more active, low cholesterol diet, and weight control.  You will be contacted regarding the referral for: echocardiogram  Please keep your appointments with your specialists as you may have planned  Please go to the LAB in the Basement (turn left off the elevator) for the tests to be done today  You will be contacted by phone if any changes need to be made immediately.  Otherwise, you will receive a letter about your results with an explanation, but please check with MyChart first.  Please remember to sign up for MyChart if you have not done so, as this will be important to you in the future with finding out test results, communicating by private email, and scheduling acute appointments online when needed.

## 2016-01-31 NOTE — Assessment & Plan Note (Signed)
Etiology unclear, cannot stay at the moment for ecg, but will plan to return later today for this as she has a sister being seen then, ,also for BNP, labs as ordered, echo and hct 25 qd

## 2016-01-31 NOTE — Progress Notes (Signed)
Pre visit review using our clinic review tool, if applicable. No additional management support is needed unless otherwise documented below in the visit note. 

## 2016-01-31 NOTE — Assessment & Plan Note (Signed)
Lab Results  Component Value Date   HGBA1C 5.6 08/24/2014   stable overall by history and exam, recent data reviewed with pt, and pt to continue medical treatment as before,  to f/u any worsening symptoms or concerns

## 2016-01-31 NOTE — Progress Notes (Addendum)
Subjective:    Patient ID: Betty Green, female    DOB: October 21, 1945, 70 y.o.   MRN: [id number]  HPI      Here with 1 mo bilateral edema assoc with wt gain; though has been trying to lose fat wt with better diet.   Pt denies chest pain, increased sob or doe, wheezing, orthopnea, PND, palpitations, dizziness or syncope.  Not taking the hct daily Wt Readings from Last 3 Encounters:  01/31/16 288 lb (130.6 kg)  12/14/15 281 lb (127.5 kg)  11/29/15 279 lb 3.2 oz (126.6 kg)  Has also known DJD bilat knees and shoulders and rotater cuff, needs to cont wt loss to qualify for surgury. Was not able to get labs done with last visit. Past Medical History:  Diagnosis Date  . Allergy   . Anxiety    "take RX prn"  . Arthritis    "all over"  . Asthma   . Chronic bronchitis ([place])   . GERD (gastroesophageal reflux disease)   . Hypertension   . Insomnia 05/31/2011  . OSA on CPAP    wears CPAP sometimes (06/23/2014)  . Other and unspecified hyperlipidemia 08/04/2012  . PONV (postoperative nausea and vomiting)   . Shortness of breath dyspnea    with exertion  . Shoulder pain, bilateral 10/18/2010  . Vitamin D deficiency 12/21/2015   Past Surgical History:  Procedure Laterality Date  . APPENDECTOMY  1966  . CHOLECYSTECTOMY OPEN  1981  . FRACTURE SURGERY    . ORIF ANKLE FRACTURE Right 06/23/2014  . ORIF ANKLE FRACTURE Right 06/23/2014   Procedure: Take Down Malunion Right Ankle, Open Reduction Internal Fixation Right Ankle, Possible Syndesmosis Repair, Medial Joint Debridement;  Surgeon: Newt Minion, MD;  Location: [facility];  Service: Orthopedics;  Laterality: Right;  . TONSILLECTOMY  1963  . TOTAL ABDOMINAL HYSTERECTOMY  1980   w/BSO    reports that she quit smoking about 32 years ago. Her smoking use included Cigarettes. She has a 0.20 pack-year smoking history. She has never used smokeless tobacco. She reports that she does not drink alcohol or use drugs. family history includes Arthritis in her  father, mother, and other; Cancer in her other; Heart disease in her father and mother; Hypertension in her father and mother. Allergies  Allergen Reactions  . Amoxicillin-Pot Clavulanate     Hives  . Azithromycin Other (See Comments)    abd pain  . Lipitor [Atorvastatin] Itching   Current Outpatient Prescriptions on File Prior to Visit  Medication Sig Dispense Refill  . albuterol (PROAIR HFA) 108 (90 BASE) MCG/ACT inhaler Inhale 2 puffs into the lungs every 6 (six) hours as needed for wheezing. 1 Inhaler 11  . cycloSPORINE modified (NEORAL) 50 MG capsule Take 100 mg by mouth.    . folic acid (FOLVITE) 1 MG tablet Take 1 mg by mouth.    . hydrOXYzine (ATARAX/VISTARIL) 25 MG tablet TAKE 1-2 TABS BY MOUTH THREE TIMES PER DAY AS NEEDED FOR ITCHING 180 tablet 0  . ILEVRO 0.3 % ophthalmic suspension PLACE 1 DROP INTO BOTH EYES TWICE A DAY  0  . losartan (COZAAR) 100 MG tablet Take 1 tablet (100 mg total) by mouth daily. 90 tablet 3  . methotrexate (RHEUMATREX) 2.5 MG tablet Take 25 mg by mouth.    . simvastatin (ZOCOR) 20 MG tablet TAKE 1 TABLET BY MOUTH AT BEDTIME 90 tablet 2  . traMADol (ULTRAM) 50 MG tablet Take 1 tablet (50 mg total) by mouth every  8 (eight) hours as needed. 120 tablet 2   No current facility-administered medications on file prior to visit.    Review of Systems  Constitutional: Negative for unusual diaphoresis or night sweats HENT: Negative for ear swelling or discharge Eyes: Negative for worsening visual haziness  Respiratory: Negative for choking and stridor.   Gastrointestinal: Negative for distension or worsening eructation Genitourinary: Negative for retention or change in urine volume.  Musculoskeletal: Negative for other MSK pain or swelling Skin: Negative for color change and worsening wound Neurological: Negative for tremors and numbness other than noted  Psychiatric/Behavioral: Negative for decreased concentration or agitation other than above   All other  system neg per pt    Objective:   Physical Exam BP 130/80   Pulse 100   Temp 98.4 F (36.9 C) (Oral)   Resp 20   Wt 288 lb (130.6 kg)   SpO2 95%   BMI 54.42 kg/m  VS noted,  Constitutional: Pt appears in no apparent distress HENT: Head: NCAT.  Right Ear: External ear normal.  Left Ear: External ear normal.  Eyes: . Pupils are equal, round, and reactive to light. Conjunctivae and EOM are normal Neck: Normal range of motion. Neck supple.  Cardiovascular: Normal rate and regular rhythm.   Pulmonary/Chest: Effort normal and breath sounds without rales or wheezing.  Abd:  Soft, NT, ND, + BS Neurological: Pt is alert. Not confused , motor grossly intact Skin: Skin is warm. No rash, 1+ LE edema to 3-4 cm below the knees bilat Psychiatric: Pt behavior is normal. No agitation.  No other new exam findings  Lab Results  Component Value Date   WBC 7.8 12/08/2014   HGB 13.2 12/08/2014   HCT 39.7 12/08/2014   PLT 313.0 12/08/2014   GLUCOSE 103 (H) 08/24/2014   CHOL 210 (H) 08/24/2014   TRIG 132.0 08/24/2014   HDL 53.00 08/24/2014   LDLDIRECT 134.1 05/02/2012   LDLCALC 131 (H) 08/24/2014   ALT 10 08/24/2014   AST 14 08/24/2014   NA 140 08/24/2014   K 4.2 08/24/2014   CL 106 08/24/2014   CREATININE 0.87 08/24/2014   BUN 10 08/24/2014   CO2 27 08/24/2014   TSH 0.85 08/24/2014   INR 1.05 06/22/2014   HGBA1C 5.6 08/24/2014    ECG today I have personally interpreted: Sinus  Rhythm  Low voltage in precordial leads.     Assessment & Plan:

## 2016-01-31 NOTE — Assessment & Plan Note (Signed)
stable overall by history and exam, recent data reviewed with pt, and pt to continue medical treatment as before,  to f/u any worsening symptoms or concerns BP Readings from Last 3 Encounters:  01/31/16 130/80  12/14/15 130/70  11/29/15 126/84

## 2016-01-31 NOTE — Addendum Note (Signed)
Addended by: Della Goo C on: 01/31/2016 05:24 PM   Modules accepted: Orders

## 2016-02-01 ENCOUNTER — Telehealth: Payer: Self-pay

## 2016-02-01 ENCOUNTER — Other Ambulatory Visit (INDEPENDENT_AMBULATORY_CARE_PROVIDER_SITE_OTHER): Payer: Medicare Other

## 2016-02-01 DIAGNOSIS — R739 Hyperglycemia, unspecified: Secondary | ICD-10-CM | POA: Diagnosis not present

## 2016-02-01 DIAGNOSIS — R609 Edema, unspecified: Secondary | ICD-10-CM

## 2016-02-01 DIAGNOSIS — I10 Essential (primary) hypertension: Secondary | ICD-10-CM | POA: Diagnosis not present

## 2016-02-01 DIAGNOSIS — R600 Localized edema: Secondary | ICD-10-CM

## 2016-02-01 LAB — LIPID PANEL
Cholesterol: 161 mg/dL (ref 0–200)
HDL: 61.5 mg/dL
LDL Cholesterol: 73 mg/dL (ref 0–99)
NonHDL: 99.33
Total CHOL/HDL Ratio: 3
Triglycerides: 131 mg/dL (ref 0.0–149.0)
VLDL: 26.2 mg/dL (ref 0.0–40.0)

## 2016-02-01 LAB — CBC WITH DIFFERENTIAL/PLATELET
BASOS PCT: 2.8 % (ref 0.0–3.0)
Basophils Absolute: 0.2 10*3/uL — ABNORMAL HIGH (ref 0.0–0.1)
EOS ABS: 0.3 10*3/uL (ref 0.0–0.7)
Eosinophils Relative: 3.6 % (ref 0.0–5.0)
HEMATOCRIT: 36.9 % (ref 36.0–46.0)
Hemoglobin: 12.3 g/dL (ref 12.0–15.0)
LYMPHS PCT: 33.8 % (ref 12.0–46.0)
Lymphs Abs: 2.8 10*3/uL (ref 0.7–4.0)
MCHC: 33.4 g/dL (ref 30.0–36.0)
MCV: 90.2 fl (ref 78.0–100.0)
Monocytes Absolute: 0.6 10*3/uL (ref 0.1–1.0)
Monocytes Relative: 6.8 % (ref 3.0–12.0)
NEUTROS ABS: 4.3 10*3/uL (ref 1.4–7.7)
Neutrophils Relative %: 53 % (ref 43.0–77.0)
PLATELETS: 295 10*3/uL (ref 150.0–400.0)
RBC: 4.09 Mil/uL (ref 3.87–5.11)
RDW: 16 % — AB (ref 11.5–15.5)
WBC: 8.2 10*3/uL (ref 4.0–10.5)

## 2016-02-01 LAB — URINALYSIS, ROUTINE W REFLEX MICROSCOPIC
Bilirubin Urine: NEGATIVE
Hgb urine dipstick: NEGATIVE
Ketones, ur: NEGATIVE
Leukocytes, UA: NEGATIVE
Nitrite: NEGATIVE
Specific Gravity, Urine: 1.02
Total Protein, Urine: NEGATIVE
Urine Glucose: NEGATIVE
Urobilinogen, UA: 0.2
pH: 7 (ref 5.0–8.0)

## 2016-02-01 LAB — TSH: TSH: 0.86 u[IU]/mL (ref 0.35–4.50)

## 2016-02-01 LAB — HEPATIC FUNCTION PANEL
ALT: 12 U/L (ref 0–35)
AST: 15 U/L (ref 0–37)
Albumin: 3.7 g/dL (ref 3.5–5.2)
Alkaline Phosphatase: 88 U/L (ref 39–117)
Bilirubin, Direct: 0 mg/dL (ref 0.0–0.3)
Total Bilirubin: 0.3 mg/dL (ref 0.2–1.2)
Total Protein: 7.3 g/dL (ref 6.0–8.3)

## 2016-02-01 LAB — BASIC METABOLIC PANEL WITH GFR
BUN: 14 mg/dL (ref 6–23)
CO2: 31 meq/L (ref 19–32)
Calcium: 9.8 mg/dL (ref 8.4–10.5)
Chloride: 105 meq/L (ref 96–112)
Creatinine, Ser: 0.98 mg/dL (ref 0.40–1.20)
GFR: 71.81 mL/min
Glucose, Bld: 112 mg/dL — ABNORMAL HIGH (ref 70–99)
Potassium: 4.6 meq/L (ref 3.5–5.1)
Sodium: 143 meq/L (ref 135–145)

## 2016-02-01 LAB — BRAIN NATRIURETIC PEPTIDE: Pro B Natriuretic peptide (BNP): 77 pg/mL (ref 0.0–100.0)

## 2016-02-01 LAB — HEMOGLOBIN A1C: Hgb A1c MFr Bld: 5.7 % (ref 4.6–6.5)

## 2016-02-01 NOTE — Telephone Encounter (Signed)
lmom to inform the pt. That we need her to bring either the card from her machine or the whole machine tomorrow for her appointment to obtain a download

## 2016-02-02 ENCOUNTER — Ambulatory Visit: Payer: Medicare Other | Admitting: Pulmonary Disease

## 2016-02-26 ENCOUNTER — Other Ambulatory Visit: Payer: Self-pay | Admitting: Internal Medicine

## 2016-02-26 DIAGNOSIS — R609 Edema, unspecified: Secondary | ICD-10-CM

## 2016-02-27 ENCOUNTER — Other Ambulatory Visit: Payer: Self-pay | Admitting: Internal Medicine

## 2016-02-27 NOTE — Telephone Encounter (Signed)
Routing to dr Jenny Reichmann, I don't see this med on patient's med list---are you ok with refilling---thanks

## 2016-02-28 DIAGNOSIS — H35033 Hypertensive retinopathy, bilateral: Secondary | ICD-10-CM | POA: Diagnosis not present

## 2016-02-28 DIAGNOSIS — H3581 Retinal edema: Secondary | ICD-10-CM | POA: Diagnosis not present

## 2016-02-28 DIAGNOSIS — H44113 Panuveitis, bilateral: Secondary | ICD-10-CM | POA: Insufficient documentation

## 2016-02-28 DIAGNOSIS — H2513 Age-related nuclear cataract, bilateral: Secondary | ICD-10-CM | POA: Diagnosis not present

## 2016-02-28 DIAGNOSIS — H30033 Focal chorioretinal inflammation, peripheral, bilateral: Secondary | ICD-10-CM | POA: Diagnosis not present

## 2016-02-28 DIAGNOSIS — H35063 Retinal vasculitis, bilateral: Secondary | ICD-10-CM | POA: Diagnosis not present

## 2016-03-07 ENCOUNTER — Telehealth (HOSPITAL_COMMUNITY): Payer: Self-pay | Admitting: Internal Medicine

## 2016-03-08 NOTE — Telephone Encounter (Signed)
03/07/16 Left Message - Call went straight to VM. Lmsg for her to CB in regards to a test being scheduled.     By Verdene Rio

## 2016-03-13 ENCOUNTER — Encounter: Payer: Self-pay | Admitting: Internal Medicine

## 2016-03-13 NOTE — Telephone Encounter (Signed)
2/6 mailed ltr to pt to call heart care to schedule/lb

## 2016-03-16 ENCOUNTER — Telehealth: Payer: Self-pay | Admitting: Internal Medicine

## 2016-03-16 NOTE — Telephone Encounter (Signed)
Called patient to schedule awv. Lvm for patien to call office to schedule appt.

## 2016-04-06 ENCOUNTER — Encounter: Payer: Self-pay | Admitting: Family Medicine

## 2016-04-06 ENCOUNTER — Ambulatory Visit (INDEPENDENT_AMBULATORY_CARE_PROVIDER_SITE_OTHER): Payer: Medicare Other | Admitting: Family Medicine

## 2016-04-06 VITALS — BP 156/90 | HR 90 | Temp 98.0°F | Resp 12 | Wt 287.6 lb

## 2016-04-06 DIAGNOSIS — H1032 Unspecified acute conjunctivitis, left eye: Secondary | ICD-10-CM | POA: Diagnosis not present

## 2016-04-06 DIAGNOSIS — I10 Essential (primary) hypertension: Secondary | ICD-10-CM

## 2016-04-06 MED ORDER — TOBRAMYCIN-DEXAMETHASONE 0.3-0.1 % OP SUSP: 2.0000 [drp] | OPHTHALMIC | 0 refills | Status: AC

## 2016-04-06 NOTE — Progress Notes (Signed)
Pre visit review using our clinic review tool, if applicable. No additional management support is needed unless otherwise documented below in the visit note. 

## 2016-04-06 NOTE — Progress Notes (Signed)
HPI:   ACUTE VISIT:  Chief Complaint  Patient presents with  . Conjunctivitis     Left Eye itchy, red, crying,and sensitivity to light    Ms.Betty Green is a 71 y.o. female, who is here today complaining of 4 days of left eye conjunctival erythema,pruritus, and epiphora. She thinks she has "pink eye", denies purulent or tick drainage. Reporting "little" eye pain.  No changes in vision and no Hx of trauma or foreign body sensation. + Photophobia. Mild eye lid swelling, no pain or erythema. Hx of iritis right eye and residual decreased vision. She follows with ophthalmologists regularly, next appt 04/10/16. She did not try to see her eye care provider today because financial and transportation issues. She is supposed to be on Methotrexate and Cyclosporin to treat right eye iritis but has not taken medications for about a month, she cannot afford them.  She denies headache,fever,chills, rash,or periocular numbness/tingling. No sick contact. She has Hx of allergic rhinitis, has had nasal congestion and rhinorrhea.  She has tried OTC "homeopathic pink eye" drops, not helping. Problem is not getting worse but she states it "is not any better."   Conjunctivitis   The onset was gradual. The problem occurs continuously. The problem has been unchanged. The problem is moderate. Nothing relieves the symptoms. Nothing aggravates the symptoms. Associated symptoms include eye itching, photophobia, congestion, rhinorrhea, eye discharge, eye pain (mild) and eye redness. Pertinent negatives include no fever, no abdominal pain, no nausea, no vomiting, no headaches, no mouth sores, no cough, no wheezing and no rash.    -Elevated BP today. Hx of HTN, Has not taken medication today. She does not check BP at home. Denies severe/frequent headache, chest pain, dyspnea, palpitation, claudication, focal weakness, or edema.   Review of Systems  Constitutional: Negative for appetite change,  fatigue, fever and unexpected weight change.  HENT: Positive for congestion and rhinorrhea. Negative for mouth sores, nosebleeds, sinus pain and trouble swallowing.   Eyes: Positive for photophobia, pain (mild), discharge, redness and itching. Negative for visual disturbance.  Respiratory: Negative for cough, shortness of breath and wheezing.   Cardiovascular: Negative for chest pain, palpitations and leg swelling.  Gastrointestinal: Negative for abdominal pain, nausea and vomiting.       Negative for changes in bowel habits.  Genitourinary: Negative for decreased urine volume and hematuria.  Musculoskeletal: Negative for myalgias.  Skin: Negative for rash.  Allergic/Immunologic: Positive for environmental allergies.  Neurological: Negative for syncope, weakness, numbness and headaches.  Psychiatric/Behavioral: Negative for confusion. The patient is not nervous/anxious.       Current Outpatient Prescriptions on File Prior to Visit  Medication Sig Dispense Refill  . albuterol (PROAIR HFA) 108 (90 BASE) MCG/ACT inhaler Inhale 2 puffs into the lungs every 6 (six) hours as needed for wheezing. 1 Inhaler 11  . cycloSPORINE modified (NEORAL) 50 MG capsule Take 100 mg by mouth.    . folic acid (FOLVITE) 1 MG tablet Take 1 mg by mouth.    . hydrochlorothiazide (HYDRODIURIL) 25 MG tablet TAKE 1 TABLET EVERY DAY AS NEEDED 30 tablet 1  . hydrOXYzine (ATARAX/VISTARIL) 25 MG tablet TAKE 1-2 TABS BY MOUTH THREE TIMES PER DAY AS NEEDED FOR ITCHING 180 tablet 0  . ILEVRO 0.3 % ophthalmic suspension PLACE 1 DROP INTO BOTH EYES TWICE A DAY  0  . losartan (COZAAR) 100 MG tablet Take 1 tablet (100 mg total) by mouth daily. 90 tablet 3  . meloxicam (MOBIC) 15 MG  tablet TAKE 1 TABLET BY MOUTH EVERY DAY WITH FOOD 30 tablet 5  . methotrexate (RHEUMATREX) 2.5 MG tablet Take 25 mg by mouth.    . simvastatin (ZOCOR) 20 MG tablet TAKE 1 TABLET BY MOUTH AT BEDTIME 90 tablet 2  . traMADol (ULTRAM) 50 MG tablet Take 1  tablet (50 mg total) by mouth every 8 (eight) hours as needed. 120 tablet 2   No current facility-administered medications on file prior to visit.      Past Medical History:  Diagnosis Date  . Allergy   . Anxiety    "take RX prn"  . Arthritis    "all over"  . Asthma   . Chronic bronchitis ([place])   . GERD (gastroesophageal reflux disease)   . Hypertension   . Insomnia 05/31/2011  . OSA on CPAP    wears CPAP sometimes (06/23/2014)  . Other and unspecified hyperlipidemia 08/04/2012  . PONV (postoperative nausea and vomiting)   . Shortness of breath dyspnea    with exertion  . Shoulder pain, bilateral 10/18/2010  . Vitamin D deficiency 12/21/2015   Allergies  Allergen Reactions  . Amoxicillin-Pot Clavulanate     Hives  . Azithromycin Other (See Comments)    abd pain  . Lipitor [Atorvastatin] Itching    Social History   Social History  . Marital status: Single    Spouse name: N/A  . Number of children: 2  . Years of education: 12   Occupational History  . retired [employer]   Social History Main Topics  . Smoking status: Former Smoker    Packs/day: 0.10    Years: 2.00    Types: Cigarettes    Quit date: 02/06/1984  . Smokeless tobacco: Never Used  . Alcohol use No  . Drug use: No  . Sexual activity: Not Currently   Other Topics Concern  . None   Social History Narrative  . None    Vitals:   04/06/16 0954  BP: (!) 156/90  Pulse: 90  Resp: 12  Temp: 98 F (36.7 C)  O2 sat 96% at RA. Body mass index is 54.34 kg/m.   Physical Exam  Nursing note and vitals reviewed. Constitutional: She is oriented to person, place, and time. She appears well-developed. No distress.  HENT:  Head: Atraumatic.  Nose: Rhinorrhea present. Right sinus exhibits no maxillary sinus tenderness and no frontal sinus tenderness. Left sinus exhibits no maxillary sinus tenderness and no frontal sinus tenderness.  Mouth/Throat: Oropharynx is clear and moist and mucous  membranes are normal.  Eyes: EOM are normal. Pupils are equal, round, and reactive to light. Left eye exhibits discharge (epiphora). Left eye exhibits no exudate. No foreign body present in the left eye. Left conjunctiva is injected. Left conjunctiva has no hemorrhage.  Fundoscopic exam:      The left eye shows no hemorrhage.    Epiphora, mild photophobia left eye. Conjunctival erythema more central than diffuse but also involves tarsal conjuntivae, no irregularity or retractions around iris appreciated.  Noted < 1 mm, rounded, regular defined borders,whitish lesions on cornea,along temporal border (?scarring changes vs acute), about 3-4. No vesicular lesions. Eye lid mild edema bilateral, no erythema or tenderness. Fundal examination was difficult, without dilation,but I did not appreciate hemorrhages.She tolerated well fundal exam.   Neck: No thyroid mass present.  Cardiovascular: Normal rate and regular rhythm.   No murmur heard. Pulses:      Dorsalis pedis pulses are 2+ on the right side, and 2+ on  the left side.  Respiratory: Effort normal and breath sounds normal. No respiratory distress.  GI: Soft.  Musculoskeletal: She exhibits no edema.  Lymphadenopathy:    She has no cervical adenopathy.  Neurological: She is alert and oriented to person, place, and time. She has normal strength. Coordination normal.  Skin: Skin is warm. No erythema.  Psychiatric: She has a normal mood and affect.  Well groomed, good eye contact.      ASSESSMENT AND PLAN:  Aleera was seen today for conjunctivitis.  Diagnoses and all orders for this visit:  Acute conjunctivitis of left eye, unspecified acute conjunctivitis type -     tobramycin-dexamethasone (TOBRADEX) ophthalmic solution; Place 2 drops into the left eye every 4 (four) hours while awake.  We discussed possible etiologies including allergic,autoinmmune, viral,and bacterial.  Recommend being evaluated today by ophthalmologist, explained  it does not have to be hers but rather who is available today but she refused. I do not have slit lamp or Fluorescein available today, I am not sure if some of finding on eye exam are chronic. Vision L eye 20/20, R 80/20.  Reporting symptoms as stable. Explained that symptoms and clinical findings do not suggest a typical bacterial conjunctivitis, I am concerned about keratitis or uveitis.  I do not appreciate signs of viral infection like zoster but clearly educated about symptoms/signs. Topical abx with steroid started today,some side effects dicussed. Clearly instructed about warning signs and adverse consequences if she does not seek immediate evaluation.Otherwise she must call her eye care provider and be seen Monday. She voices understanding.  Essential hypertension  Elevated today. Continue same management. Try to check BP at home and follow with PCP if > 140/90 persistently.  -Ms.Betty Green was advised to return or notify a doctor immediately if symptoms worsen or new concerns arise.       Pascale Maves G. Martinique, MD  [facility]. [place] office.

## 2016-04-06 NOTE — Patient Instructions (Signed)
  Ms.Betty Green I have seen you today for an acute visit.  A few things to remember from today's visit:   Essential hypertension  Acute conjunctivitis of left eye, unspecified acute conjunctivitis type - Plan: tobramycin-dexamethasone (TOBRADEX) ophthalmic solution     Medications prescribed today are intended for short period of time and will not be refill upon request, a follow up appointment might be necessary to discuss continuation of of treatment if appropriate.     In general please monitor for signs of worsening symptoms and seek immediate medical attention if any concerning.  You MUST see your eye care provider by Monday.  If sudden worsening symptoms or changes in vision you need to seek immediate medical attention.  Monitor blood pressure at home.   Please be sure you have an appointment already scheduled with your PCP before you leave today.

## 2016-04-11 ENCOUNTER — Telehealth: Payer: Self-pay | Admitting: Pulmonary Disease

## 2016-04-11 NOTE — Telephone Encounter (Signed)
Attempted to contact pt. No answer and her voicemail is full. Will try back.

## 2016-04-12 ENCOUNTER — Encounter: Payer: Self-pay | Admitting: Emergency Medicine

## 2016-04-12 ENCOUNTER — Ambulatory Visit (INDEPENDENT_AMBULATORY_CARE_PROVIDER_SITE_OTHER): Payer: Medicare Other | Admitting: Emergency Medicine

## 2016-04-12 VITALS — BP 130/72 | HR 85 | Ht 62.0 in | Wt 287.2 lb

## 2016-04-12 DIAGNOSIS — G4733 Obstructive sleep apnea (adult) (pediatric): Secondary | ICD-10-CM

## 2016-04-12 NOTE — Patient Instructions (Signed)
We will send an order to [place] to change to Auto-titration mode to see if this helps your nighttime awakenings.  We will set you up to see one our sleep physicians in 2 months to assess improvement, compliance symptoms.

## 2016-04-12 NOTE — Progress Notes (Signed)
Subjective:    Patient ID: Betty Green, female    DOB: [birth year], 71 y.o.   MRN: [id number]  HPI 71 yo former minimal smoker with OSA followed by Dr DeDios and restarted on CPAP in 2017. Carries a hx recurrent bronchitis in the chart, not having sx currently. She presents today reporting that she is experiencing recurrent episodes of nocturnal awakenings - snoring and choking herself awake even while wearing her CPAP. Also notes that her mask fit is poor - nasal pillows are irritating her nose. Marland Kitchen Also having some exertional dyspnea. Notes that it started many yrs ago.    Review of Systems As per HPI  Past Medical History:  Diagnosis Date  . Allergy   . Anxiety    "take RX prn"  . Arthritis    "all over"  . Asthma   . Chronic bronchitis ([place])   . GERD (gastroesophageal reflux disease)   . Hypertension   . Insomnia 05/31/2011  . OSA on CPAP    wears CPAP sometimes (06/23/2014)  . Other and unspecified hyperlipidemia 08/04/2012  . PONV (postoperative nausea and vomiting)   . Shortness of breath dyspnea    with exertion  . Shoulder pain, bilateral 10/18/2010  . Vitamin D deficiency 12/21/2015     Family History  Problem Relation Age of Onset  . Arthritis Mother   . Heart disease Mother   . Hypertension Mother   . Arthritis Father   . Heart disease Father   . Hypertension Father   . Arthritis Other   . Cancer Other     colon and prostate cancer     Social History   Social History  . Marital status: Single    Spouse name: N/A  . Number of children: 2  . Years of education: 12   Occupational History  . retired [employer]   Social History Main Topics  . Smoking status: Former Smoker    Packs/day: 0.10    Years: 2.00    Types: Cigarettes    Quit date: 02/06/1984  . Smokeless tobacco: Never Used  . Alcohol use No  . Drug use: No  . Sexual activity: Not Currently   Other Topics Concern  . Not on file   Social History Narrative  . No narrative on  file     Allergies  Allergen Reactions  . Amoxicillin-Pot Clavulanate     Hives  . Azithromycin Other (See Comments)    abd pain  . Lipitor [Atorvastatin] Itching     Outpatient Medications Prior to Visit  Medication Sig Dispense Refill  . cycloSPORINE modified (NEORAL) 50 MG capsule Take 100 mg by mouth.    . folic acid (FOLVITE) 1 MG tablet Take 1 mg by mouth.    . hydrochlorothiazide (HYDRODIURIL) 25 MG tablet TAKE 1 TABLET EVERY DAY AS NEEDED 30 tablet 1  . hydrOXYzine (ATARAX/VISTARIL) 25 MG tablet TAKE 1-2 TABS BY MOUTH THREE TIMES PER DAY AS NEEDED FOR ITCHING 180 tablet 0  . losartan (COZAAR) 100 MG tablet Take 1 tablet (100 mg total) by mouth daily. 90 tablet 3  . meloxicam (MOBIC) 15 MG tablet TAKE 1 TABLET BY MOUTH EVERY DAY WITH FOOD 30 tablet 5  . methotrexate (RHEUMATREX) 2.5 MG tablet Take 25 mg by mouth.    . simvastatin (ZOCOR) 20 MG tablet TAKE 1 TABLET BY MOUTH AT BEDTIME 90 tablet 2  . tobramycin-dexamethasone (TOBRADEX) ophthalmic solution Place 2 drops into the left eye every 4 (four) hours  while awake. 5 mL 0  . traMADol (ULTRAM) 50 MG tablet Take 1 tablet (50 mg total) by mouth every 8 (eight) hours as needed. 120 tablet 2  . ILEVRO 0.3 % ophthalmic suspension PLACE 1 DROP INTO BOTH EYES TWICE A DAY  0  . albuterol (PROAIR HFA) 108 (90 BASE) MCG/ACT inhaler Inhale 2 puffs into the lungs every 6 (six) hours as needed for wheezing. (Patient not taking: Reported on 04/12/2016) 1 Inhaler 11   No facility-administered medications prior to visit.         Objective:   Physical Exam Vitals:   04/12/16 1021  BP: 130/72  Pulse: 85  SpO2: 99%  Weight: 287 lb 3.2 oz (130.3 kg)  Height: 5\' 2"  (1.575 m)   Gen: Pleasant, obese woman, in no distress,  normal affect  ENT: No lesions,  mouth clear,  oropharynx clear, no postnasal drip  Neck: No JVD, no TMG, no carotid bruits  Lungs: No use of accessory muscles, clear without rales or rhonchi  Cardiovascular: RRR,  heart sounds normal, no murmur or gallops, no peripheral edema  Musculoskeletal: No deformities, no cyanosis or clubbing  Neuro: alert, non focal  Skin: Warm, no lesions or rashe      Assessment & Plan:  Obstructive sleep apnea She sounds under-treated and with breakthrough sx on CPAP 12 cm H2O. Discussed a repeat CPAP titration study vs a change to auto-set device. She woulfd prefer to try the latter if possible. Will send an order to [place], try to change to auto-set 5-20. Also will work on a better mask fit. She wants to follow with dedicated sleep MD (will need a new MD after Dr deDios departs).   Baltazar Apo, MD, PhD 04/12/2016, 10:46 AM [facility] [phone] or if no answer [phone]

## 2016-04-12 NOTE — Assessment & Plan Note (Signed)
She sounds under-treated and with breakthrough sx on CPAP 12 cm H2O. Discussed a repeat CPAP titration study vs a change to auto-set device. She woulfd prefer to try the latter if possible. Will send an order to [place], try to change to auto-set 5-20. Also will work on a better mask fit. She wants to follow with dedicated sleep MD (will need a new MD after Dr deDios departs).

## 2016-04-12 NOTE — Telephone Encounter (Signed)
Attempted to contact pt x2. No answer and her voicemail is full. Will try back.

## 2016-04-13 NOTE — Telephone Encounter (Signed)
Attempted to contact pt x3. No answer and her voicemail is full. Per triage protocol, message will be closed.

## 2016-04-30 ENCOUNTER — Telehealth: Payer: Self-pay | Admitting: Emergency Medicine

## 2016-04-30 NOTE — Telephone Encounter (Signed)
LM x 1 Pt needs to contact DME to discuss delay.  Order received by [facility] on 04/17/16, pt needs to reach out to them to inquire. Referral Notes  Number of Notes: 3  Type Date User Summary Attachment  General 04/17/2016 4:43 PM Joellen Jersey - -  Note   [facility] has received this order Joellen Jersey

## 2016-05-02 NOTE — Telephone Encounter (Signed)
Attempted to contact the pt but the VM is full.  Will try back later.  

## 2016-05-03 ENCOUNTER — Other Ambulatory Visit: Payer: Self-pay | Admitting: Internal Medicine

## 2016-05-03 ENCOUNTER — Telehealth (HOSPITAL_COMMUNITY): Payer: Self-pay | Admitting: Internal Medicine

## 2016-05-03 DIAGNOSIS — I5030 Unspecified diastolic (congestive) heart failure: Secondary | ICD-10-CM

## 2016-05-03 NOTE — Telephone Encounter (Signed)
  05/03/2016 08:44 AM Phone (Outgoing) Green, Betty R (Self) [phone] (H)   Left Message - Called pt and lmsg for her to CB to schedule an echo.     By Verdene Rio

## 2016-05-03 NOTE — Telephone Encounter (Signed)
LMTCB for pt 

## 2016-05-07 NOTE — Telephone Encounter (Signed)
lmtcb for pt.  

## 2016-05-08 NOTE — Telephone Encounter (Signed)
LMTCB x 4 and will now close per triage protocol

## 2016-05-15 DIAGNOSIS — H35371 Puckering of macula, right eye: Secondary | ICD-10-CM | POA: Diagnosis not present

## 2016-05-15 DIAGNOSIS — H2513 Age-related nuclear cataract, bilateral: Secondary | ICD-10-CM | POA: Diagnosis not present

## 2016-05-15 DIAGNOSIS — H3581 Retinal edema: Secondary | ICD-10-CM | POA: Diagnosis not present

## 2016-05-15 DIAGNOSIS — H30033 Focal chorioretinal inflammation, peripheral, bilateral: Secondary | ICD-10-CM | POA: Diagnosis not present

## 2016-05-15 DIAGNOSIS — H35033 Hypertensive retinopathy, bilateral: Secondary | ICD-10-CM | POA: Diagnosis not present

## 2016-05-15 DIAGNOSIS — Z79899 Other long term (current) drug therapy: Secondary | ICD-10-CM | POA: Diagnosis not present

## 2016-05-15 DIAGNOSIS — H35063 Retinal vasculitis, bilateral: Secondary | ICD-10-CM | POA: Diagnosis not present

## 2016-05-15 DIAGNOSIS — H44113 Panuveitis, bilateral: Secondary | ICD-10-CM | POA: Diagnosis not present

## 2016-05-18 ENCOUNTER — Ambulatory Visit (HOSPITAL_COMMUNITY): Payer: Medicare HMO | Attending: Cardiovascular Disease

## 2016-05-18 ENCOUNTER — Other Ambulatory Visit: Payer: Self-pay

## 2016-05-18 DIAGNOSIS — Z87891 Personal history of nicotine dependence: Secondary | ICD-10-CM | POA: Diagnosis not present

## 2016-05-18 DIAGNOSIS — G4733 Obstructive sleep apnea (adult) (pediatric): Secondary | ICD-10-CM | POA: Diagnosis not present

## 2016-05-18 DIAGNOSIS — I110 Hypertensive heart disease with heart failure: Secondary | ICD-10-CM | POA: Diagnosis not present

## 2016-05-18 DIAGNOSIS — I5031 Acute diastolic (congestive) heart failure: Secondary | ICD-10-CM | POA: Insufficient documentation

## 2016-05-18 DIAGNOSIS — I5030 Unspecified diastolic (congestive) heart failure: Secondary | ICD-10-CM

## 2016-06-16 IMAGING — CR DG ANKLE COMPLETE 3+V*R*
3 series · 3 of 3 positions shown · non-contrast
Comparison: None.

CLINICAL DATA: Fall from acute medial right ankle pain and
swelling.

EXAM:
RIGHT ANKLE - COMPLETE 3+ VIEW

[x ankle ap right]
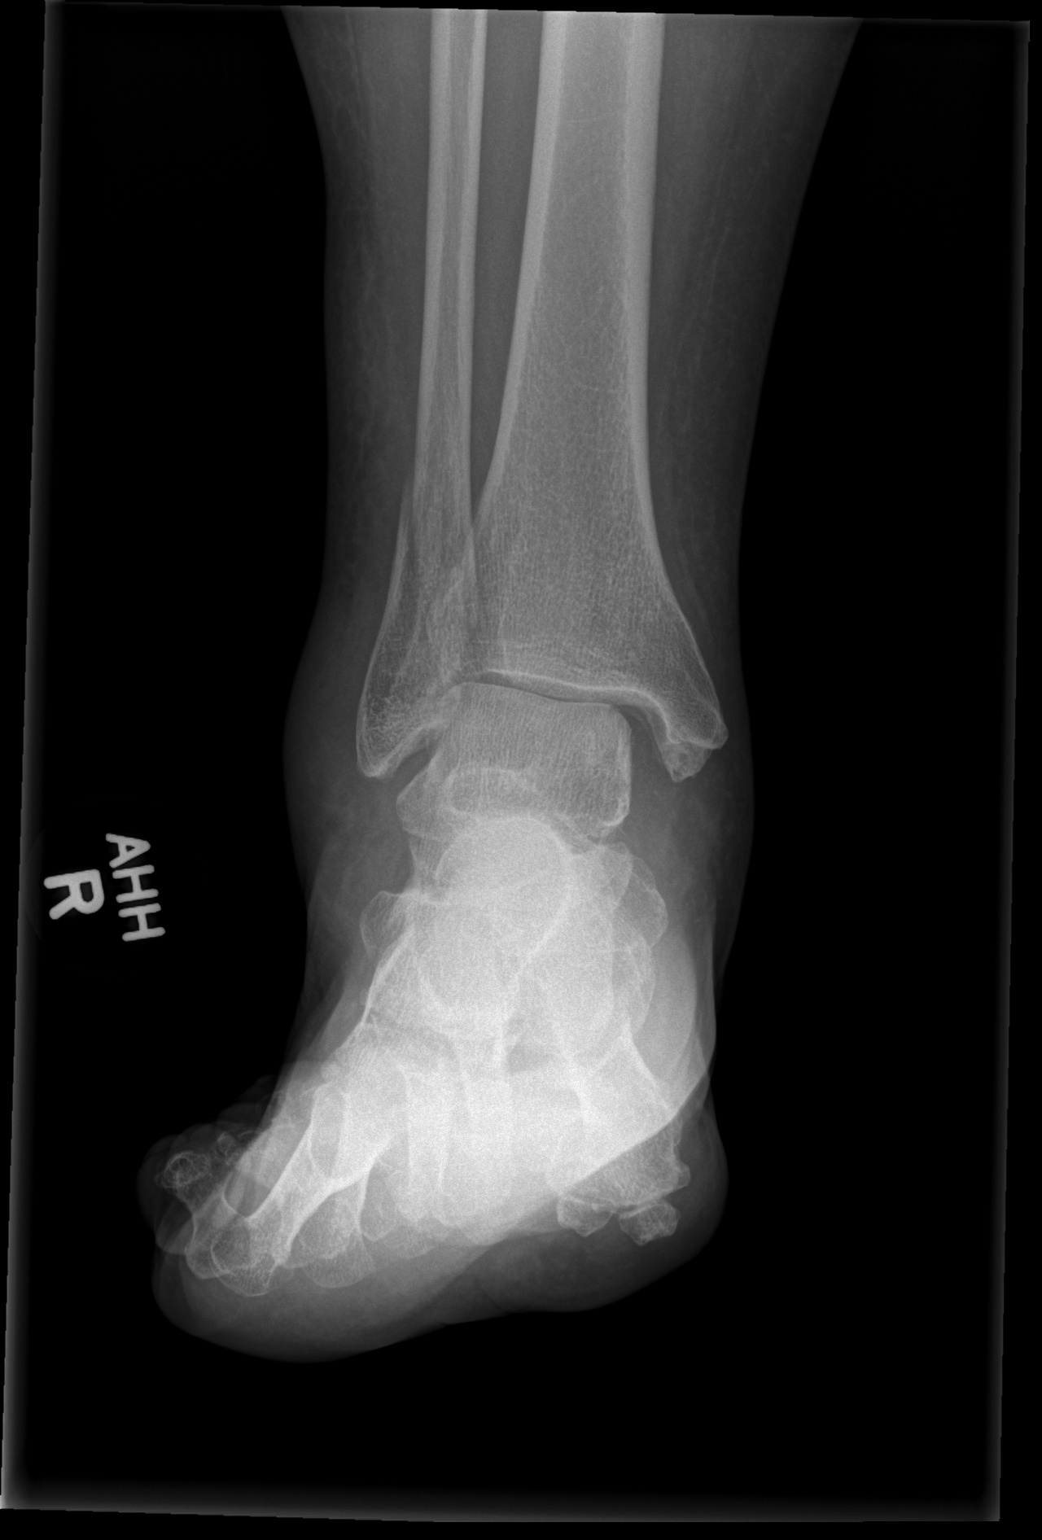

[x ankle obl right]
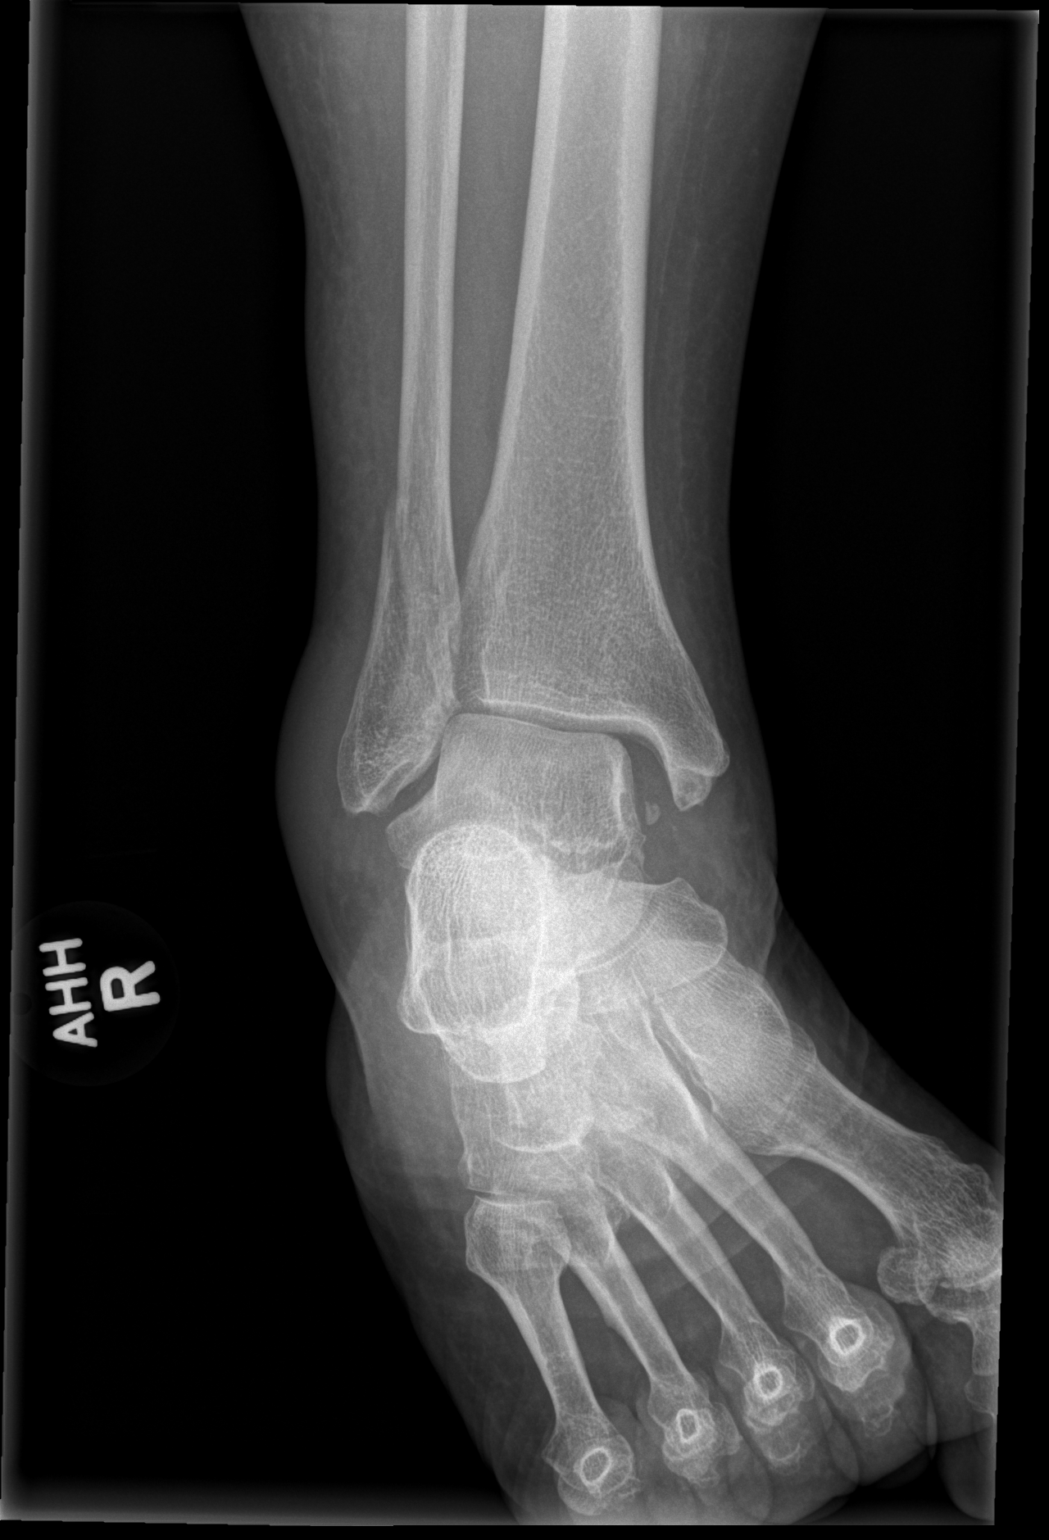

[x ankle lat right]
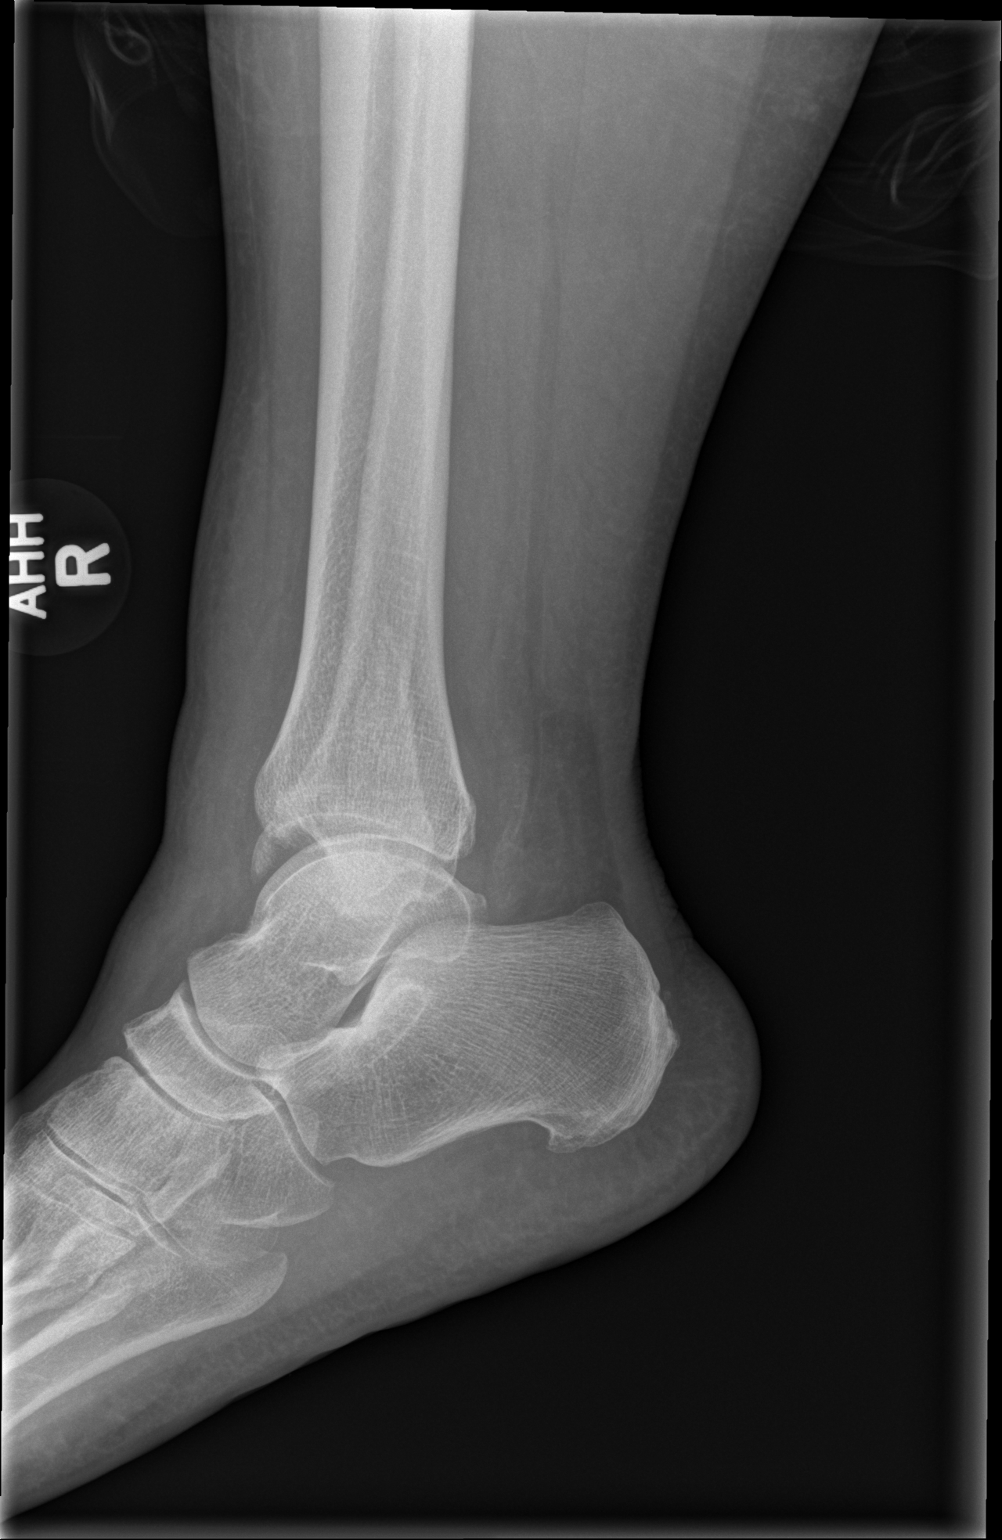

[3 of 3 positions shown; findings below may reference images not displayed]

FINDINGS: There is an acute minimally displaced oblique fracture of the right
distal fibula. Right ankle alignment maintained. Talus and calcaneus
appear intact. On the lateral view, there is a small displaced
fracture of the anterior tibia articular surface. Diffuse soft
tissue swelling. No subluxation or dislocation.
IMPRESSION: Acute minimally displaced right distal fibula fracture.

Acute small displaced fracture of the anterior tibial articular
surface on the lateral view.

Associated diffuse soft tissue swelling.

## 2016-06-21 ENCOUNTER — Ambulatory Visit: Payer: Medicare Other | Admitting: Pulmonary Disease

## 2016-07-03 ENCOUNTER — Other Ambulatory Visit: Payer: Self-pay | Admitting: Internal Medicine

## 2016-07-03 DIAGNOSIS — M25562 Pain in left knee: Secondary | ICD-10-CM | POA: Diagnosis not present

## 2016-07-03 DIAGNOSIS — R262 Difficulty in walking, not elsewhere classified: Secondary | ICD-10-CM | POA: Diagnosis not present

## 2016-07-03 DIAGNOSIS — M25561 Pain in right knee: Secondary | ICD-10-CM | POA: Diagnosis not present

## 2016-07-03 DIAGNOSIS — M170 Bilateral primary osteoarthritis of knee: Secondary | ICD-10-CM | POA: Diagnosis not present

## 2016-07-12 ENCOUNTER — Encounter: Payer: Self-pay | Admitting: Internal Medicine

## 2016-07-12 ENCOUNTER — Ambulatory Visit (INDEPENDENT_AMBULATORY_CARE_PROVIDER_SITE_OTHER): Payer: Medicare HMO | Admitting: Internal Medicine

## 2016-07-12 VITALS — BP 122/88 | HR 91 | Ht 62.0 in | Wt 283.0 lb

## 2016-07-12 DIAGNOSIS — J019 Acute sinusitis, unspecified: Secondary | ICD-10-CM | POA: Insufficient documentation

## 2016-07-12 DIAGNOSIS — H1031 Unspecified acute conjunctivitis, right eye: Secondary | ICD-10-CM

## 2016-07-12 DIAGNOSIS — I10 Essential (primary) hypertension: Secondary | ICD-10-CM

## 2016-07-12 DIAGNOSIS — J45909 Unspecified asthma, uncomplicated: Secondary | ICD-10-CM | POA: Diagnosis not present

## 2016-07-12 MED ORDER — DOXYCYCLINE HYCLATE 100 MG PO TABS: 100.0000 mg | ORAL_TABLET | Freq: Two times a day (BID) | ORAL | 0 refills | Status: DC

## 2016-07-12 MED ORDER — ERYTHROMYCIN 5 MG/GM OP OINT: 1.0000 "application " | TOPICAL_OINTMENT | Freq: Four times a day (QID) | OPHTHALMIC | 0 refills | Status: AC

## 2016-07-12 NOTE — Progress Notes (Signed)
Subjective:    Patient ID: Betty Green, female    DOB: Jan 22, 1946, 71 y.o.   MRN: [id number]  HPI  Here with 2-3 days onset right eye marked erythema, cloudy d/c, feverish with HA and also facial pain, pressure, headache, general weakness and malaise, and greenish d/c, with mild ST and cough, but pt denies vision change, chest pain, wheezing, increased sob or doe, orthopnea, PND, increased LE swelling, palpitations, dizziness or syncope.  Has not had increased use of inhalers Past Medical History:  Diagnosis Date  . Allergy   . Anxiety    "take RX prn"  . Arthritis    "all over"  . Asthma   . Chronic bronchitis ([place])   . GERD (gastroesophageal reflux disease)   . Hypertension   . Insomnia 05/31/2011  . OSA on CPAP    wears CPAP sometimes (06/23/2014)  . Other and unspecified hyperlipidemia 08/04/2012  . PONV (postoperative nausea and vomiting)   . Shortness of breath dyspnea    with exertion  . Shoulder pain, bilateral 10/18/2010  . Vitamin D deficiency 12/21/2015   Past Surgical History:  Procedure Laterality Date  . APPENDECTOMY  1966  . CHOLECYSTECTOMY OPEN  1981  . FRACTURE SURGERY    . ORIF ANKLE FRACTURE Right 06/23/2014  . ORIF ANKLE FRACTURE Right 06/23/2014   Procedure: Take Down [place] Right Ankle, Open Reduction Internal Fixation Right Ankle, Possible Syndesmosis Repair, Medial Joint Debridement;  Surgeon: Newt Minion, MD;  Location: [place] [facility];  Service: Orthopedics;  Laterality: Right;  . TONSILLECTOMY  1963  . TOTAL ABDOMINAL HYSTERECTOMY  1980   w/BSO    reports that she quit smoking about 32 years ago. Her smoking use included Cigarettes. She has a 0.20 pack-year smoking history. She has never used smokeless tobacco. She reports that she does not drink alcohol or use drugs. family history includes Arthritis in her father, mother, and other; Cancer in her other; Heart disease in her father and mother; Hypertension in her father and mother. Allergies  Allergen  Reactions  . Amoxicillin-Pot Clavulanate     Hives  . Azithromycin Other (See Comments)    abd pain  . Lipitor [Atorvastatin] Itching   Current Outpatient Prescriptions on File Prior to Visit  Medication Sig Dispense Refill  . albuterol (PROAIR HFA) 108 (90 BASE) MCG/ACT inhaler Inhale 2 puffs into the lungs every 6 (six) hours as needed for wheezing. 1 Inhaler 11  . cycloSPORINE modified (NEORAL) 50 MG capsule Take 100 mg by mouth.    . folic acid (FOLVITE) 1 MG tablet Take 1 mg by mouth.    . hydrochlorothiazide (HYDRODIURIL) 25 MG tablet TAKE 1 TABLET EVERY DAY AS NEEDED 30 tablet 1  . hydrOXYzine (ATARAX/VISTARIL) 25 MG tablet TAKE 1-2 TABS BY MOUTH THREE TIMES PER DAY AS NEEDED FOR ITCHING 180 tablet 0  . losartan (COZAAR) 100 MG tablet Take 1 tablet (100 mg total) by mouth daily. 90 tablet 3  . meloxicam (MOBIC) 15 MG tablet TAKE 1 TABLET BY MOUTH EVERY DAY WITH FOOD 30 tablet 5  . methotrexate (RHEUMATREX) 2.5 MG tablet Take 25 mg by mouth.    . simvastatin (ZOCOR) 20 MG tablet TAKE 1 TABLET BY MOUTH AT BEDTIME 90 tablet 2  . traMADol (ULTRAM) 50 MG tablet Take 1 tablet (50 mg total) by mouth every 8 (eight) hours as needed. 120 tablet 2   No current facility-administered medications on file prior to visit.    Review of Systems  Constitutional: Negative for other unusual diaphoresis or sweats HENT: Negative for ear discharge or swelling Eyes: Negative for other worsening visual disturbances Respiratory: Negative for stridor or other swelling  Gastrointestinal: Negative for worsening distension or other blood Genitourinary: Negative for retention or other urinary change Musculoskeletal: Negative for other MSK pain or swelling Skin: Negative for color change or other new lesions Neurological: Negative for worsening tremors and other numbness  Psychiatric/Behavioral: Negative for worsening agitation or other fatigue All other system neg per pt    Objective:   Physical Exam BP  122/88   Pulse 91   Ht 5\' 2"  (1.575 m)   Wt 283 lb (128.4 kg)   SpO2 99%   BMI 51.76 kg/m  VS noted,  Constitutional: Pt appears in NAD HENT: Head: NCAT.  Right Ear: External ear normal.  Left Ear: External ear normal.  Bilat tm's with mild erythema.  Max sinus areas mild tender.  Pharynx with mild erythema, no exudate Eyes: . Pupils are equal, round, and reactive to light. Conjunctivae and EOM are normal except for right conjunctiva with severe erythema, cloudy d/c and mild to mod swelling right upper and lower eyelids Nose: without d/c or deformity Neck: Neck supple. Gross normal ROM Cardiovascular: Normal rate and regular rhythm.   Pulmonary/Chest: Effort normal and breath sounds without rales or wheezing.  Neurological: Pt is alert. At baseline orientation, motor grossly intact, CN 2-12 intact Skin: Skin is warm. No rashes, other new lesions, no LE edema Psychiatric: Pt behavior is normal without agitation  No other exam findings    Assessment & Plan:

## 2016-07-12 NOTE — Patient Instructions (Signed)
Please take all new medication as prescribed - the eye ointment, and the pill antibiotics  Please continue all other medications as before, and refills have been done if requested.  Please have the pharmacy call with any other refills you may need.  Please keep your appointments with your specialists as you may have planned

## 2016-07-13 NOTE — Assessment & Plan Note (Signed)
stable overall by history and exam, and pt to continue medical treatment as before,  to f/u any worsening symptoms or concerns 

## 2016-07-13 NOTE — Assessment & Plan Note (Signed)
stable overall by history and exam, recent data reviewed with pt, and pt to continue medical treatment as before,  to f/u any worsening symptoms or concerns BP Readings from Last 3 Encounters:  07/12/16 122/88  04/12/16 130/72  04/06/16 (!) 156/90

## 2016-07-13 NOTE — Assessment & Plan Note (Signed)
Mild to mod, for antibx course,  to f/u any worsening symptoms or concerns 

## 2016-07-17 DIAGNOSIS — Z79899 Other long term (current) drug therapy: Secondary | ICD-10-CM | POA: Diagnosis not present

## 2016-07-17 DIAGNOSIS — H30001 Unspecified focal chorioretinal inflammation, right eye: Secondary | ICD-10-CM | POA: Diagnosis not present

## 2016-07-17 DIAGNOSIS — H2513 Age-related nuclear cataract, bilateral: Secondary | ICD-10-CM | POA: Diagnosis not present

## 2016-07-17 DIAGNOSIS — H30003 Unspecified focal chorioretinal inflammation, bilateral: Secondary | ICD-10-CM | POA: Diagnosis not present

## 2016-07-17 DIAGNOSIS — H30033 Focal chorioretinal inflammation, peripheral, bilateral: Secondary | ICD-10-CM | POA: Diagnosis not present

## 2016-07-17 DIAGNOSIS — H35063 Retinal vasculitis, bilateral: Secondary | ICD-10-CM | POA: Diagnosis not present

## 2016-07-17 DIAGNOSIS — H3581 Retinal edema: Secondary | ICD-10-CM | POA: Diagnosis not present

## 2016-07-17 DIAGNOSIS — H35033 Hypertensive retinopathy, bilateral: Secondary | ICD-10-CM | POA: Diagnosis not present

## 2016-08-10 ENCOUNTER — Ambulatory Visit (INDEPENDENT_AMBULATORY_CARE_PROVIDER_SITE_OTHER): Payer: Medicare HMO

## 2016-08-10 ENCOUNTER — Ambulatory Visit (INDEPENDENT_AMBULATORY_CARE_PROVIDER_SITE_OTHER): Payer: Medicare HMO | Admitting: Family

## 2016-08-10 ENCOUNTER — Encounter (INDEPENDENT_AMBULATORY_CARE_PROVIDER_SITE_OTHER): Payer: Self-pay | Admitting: Family

## 2016-08-10 VITALS — Ht 62.0 in | Wt 283.0 lb

## 2016-08-10 DIAGNOSIS — G8929 Other chronic pain: Secondary | ICD-10-CM

## 2016-08-10 DIAGNOSIS — M25562 Pain in left knee: Secondary | ICD-10-CM

## 2016-08-10 DIAGNOSIS — M25561 Pain in right knee: Secondary | ICD-10-CM | POA: Diagnosis not present

## 2016-08-10 DIAGNOSIS — M170 Bilateral primary osteoarthritis of knee: Secondary | ICD-10-CM | POA: Diagnosis not present

## 2016-08-10 MED ORDER — MELOXICAM 15 MG PO TABS: 15.0000 mg | ORAL_TABLET | Freq: Every day | ORAL | 5 refills | Status: DC

## 2016-08-10 NOTE — Progress Notes (Signed)
Office Visit Note   Patient: Betty Green           Date of Birth: February 02, 1946           MRN: [id number] Visit Date: 08/10/2016              Requested by: Biagio Borg, MD [address] [place], [place] [address] PCP: Biagio Borg, MD  Chief Complaint  Patient presents with  . Right Knee - Pain  . Left Knee - Pain      HPI: The patient is a 71 year old woman who presents today complaining of bilateral knee pain. She reports she has extensive arthritis. Radiographs were last done many years ago. She complains of popping catching giving way throbbing pain as well as start up stiffness. She has had cortisone injections in the past with relief. She request repeat injection today. She is considering total knee arthroplasty bilaterally.  Assessment & Plan: Visit Diagnoses:  1. Bilateral primary osteoarthritis of knee   2. Chronic pain of right knee   3. Chronic pain of left knee     Plan: Cortisone injection bilateral knees today. We will order Synvisc for both knees. She'll follow-up in office for these injections when her pain has returned. Would like to discuss knee replacement Dr. Sharol Given at follow-up.  Follow-Up Instructions: No Follow-up on file.   Right Knee Exam   Tenderness  The patient is experiencing tenderness in the lateral joint line and medial joint line.  Tests  Varus: negative Valgus: negative  Other  Swelling: none Other tests: no effusion present   Left Knee Exam   Tenderness  The patient is experiencing tenderness in the medial joint line.  Tests  Varus: negative Valgus: negative  Other  Swelling: none Effusion: no effusion present      Patient is alert, oriented, no adenopathy, well-dressed, normal affect, normal respiratory effort.   Imaging: No results found.  Labs: Lab Results  Component Value Date   HGBA1C 5.7 02/01/2016   HGBA1C 5.6 08/24/2014   HGBA1C 5.5 07/22/2013   ESRSEDRATE 56 (H) 12/08/2014   CRP 3.4  12/08/2014    Orders:  Orders Placed This Encounter  Procedures  . XR Knee 1-2 Views Right  . XR Knee 1-2 Views Left   No orders of the defined types were placed in this encounter.    Procedures: Large Joint Inj Date/Time: 08/10/2016 11:25 AM Performed by: Suzan Slick Authorized by: Dondra Prader R   Consent Given by:  Patient Site marked: the procedure site was marked   Timeout: prior to procedure the correct patient, procedure, and site was verified   Indications:  Pain and diagnostic evaluation Location:  Knee Site:  R knee Needle Size:  22 G Needle Length:  1.5 inches Ultrasound Guidance: No   Fluoroscopic Guidance: No   Arthrogram: No   Medications:  5 mL lidocaine 1 %; 40 mg methylPREDNISolone acetate 40 MG/ML Aspiration Attempted: No   Patient tolerance:  Patient tolerated the procedure well with no immediate complications Large Joint Inj Date/Time: 08/10/2016 11:25 AM Performed by: Dondra Prader R Authorized by: Dondra Prader R   Consent Given by:  Patient Site marked: the procedure site was marked   Timeout: prior to procedure the correct patient, procedure, and site was verified   Indications:  Pain and diagnostic evaluation Location:  Knee Site:  L knee Needle Size:  22 G Needle Length:  1.5 inches Ultrasound Guidance: No  Fluoroscopic Guidance: No   Arthrogram: No   Medications:  5 mL lidocaine 1 %; 40 mg methylPREDNISolone acetate 40 MG/ML Aspiration Attempted: No   Patient tolerance:  Patient tolerated the procedure well with no immediate complications    Clinical Data: No additional findings.  ROS:  All other systems negative, except as noted in the HPI. Review of Systems  Constitutional: Negative for chills and fever.  Musculoskeletal: Positive for arthralgias. Negative for joint swelling.    Objective: Vital Signs: Ht 5\' 2"  (1.575 m)   Wt 283 lb (128.4 kg)   BMI 51.76 kg/m   Specialty Comments:  No specialty comments  available.  PMFS History: Patient Active Problem List   Diagnosis Date Noted  . Acute conjunctivitis of right eye 07/12/2016  . Acute sinus infection 07/12/2016  . Vitamin D deficiency 12/21/2015  . Dyspnea 12/14/2015  . Hyperglycemia 12/14/2015  . Sleep paralysis 11/29/2015  . Recurrent acute iridocyclitis of both eyes 12/11/2014  . Polyarthralgia 12/08/2014  . Ankle fracture 06/23/2014  . Morbid obesity ([place]) 03/24/2014  . Peripheral edema 10/20/2012  . Other and unspecified hyperlipidemia 08/04/2012  . Intertrigo 05/03/2012  . Eczema 09/19/2011  . Obstructive sleep apnea 06/04/2011  . Anxiety 05/31/2011  . Insomnia 05/31/2011  . Shoulder pain, bilateral 10/18/2010  . Seasonal and perennial allergic rhinitis   . Allergic asthma   . Depression   . Hypertension   . Arthritis   . Encounter for well adult exam with abnormal findings 10/13/2010   Past Medical History:  Diagnosis Date  . Allergy   . Anxiety    "take RX prn"  . Arthritis    "all over"  . Asthma   . Chronic bronchitis ([place])   . GERD (gastroesophageal reflux disease)   . Hypertension   . Insomnia 05/31/2011  . OSA on CPAP    wears CPAP sometimes (06/23/2014)  . Other and unspecified hyperlipidemia 08/04/2012  . PONV (postoperative nausea and vomiting)   . Shortness of breath dyspnea    with exertion  . Shoulder pain, bilateral 10/18/2010  . Vitamin D deficiency 12/21/2015    Family History  Problem Relation Age of Onset  . Arthritis Other   . Arthritis Mother   . Heart disease Mother   . Hypertension Mother   . Arthritis Father   . Heart disease Father   . Hypertension Father   . Cancer Other        colon and prostate cancer    Past Surgical History:  Procedure Laterality Date  . APPENDECTOMY  1966  . CHOLECYSTECTOMY OPEN  1981  . FRACTURE SURGERY    . ORIF ANKLE FRACTURE Right 06/23/2014  . ORIF ANKLE FRACTURE Right 06/23/2014   Procedure: Take Down Malunion Right Ankle, Open Reduction  Internal Fixation Right Ankle, Possible Syndesmosis Repair, Medial Joint Debridement;  Surgeon: Newt Minion, MD;  Location: [facility];  Service: Orthopedics;  Laterality: Right;  . TONSILLECTOMY  1963  . TOTAL ABDOMINAL HYSTERECTOMY  1980   w/BSO   Social History   Occupational History  . retired [employer]   Social History Main Topics  . Smoking status: Former Smoker    Packs/day: 0.10    Years: 2.00    Types: Cigarettes    Quit date: 02/06/1984  . Smokeless tobacco: Never Used  . Alcohol use No  . Drug use: No  . Sexual activity: Not Currently

## 2016-08-13 MED ORDER — LIDOCAINE HCL 1 % IJ SOLN
5.0000 mL | INTRAMUSCULAR | Status: AC | PRN
  Administered 2016-08-10: 5 mL

## 2016-08-13 MED ORDER — METHYLPREDNISOLONE ACETATE 40 MG/ML IJ SUSP
40.0000 mg | INTRAMUSCULAR | Status: AC | PRN
  Administered 2016-08-10: 40 mg via INTRA_ARTICULAR

## 2016-08-15 ENCOUNTER — Telehealth (INDEPENDENT_AMBULATORY_CARE_PROVIDER_SITE_OTHER): Payer: Self-pay | Admitting: Radiology

## 2016-08-15 NOTE — Telephone Encounter (Signed)
-----   Message from Suzan Slick, NP sent at 08/10/2016 11:25 AM EDT ----- Synvisc, will you order for both knees

## 2016-08-15 NOTE — Telephone Encounter (Signed)
Submitted on portal for bilateral knees. Pending insurance approval

## 2016-08-17 ENCOUNTER — Telehealth (INDEPENDENT_AMBULATORY_CARE_PROVIDER_SITE_OTHER): Payer: Self-pay | Admitting: Orthopedic Surgery

## 2016-08-17 NOTE — Telephone Encounter (Signed)
I called and left voicemail for patient advised that prior authorization was submitted to [facility]. Her benefits were ran through the portal earlier this week. And unfortunately that they do require prior authorization and this was submitted to insurance. Pending insurance response. Once I hear back I will call the patient to advise their response.

## 2016-08-17 NOTE — Telephone Encounter (Signed)
PLEASE CALL PT REGARDING HER INJ APPROVAL. STATED SHE IS IN A LOT OF PAIN.  [phone]

## 2016-08-17 NOTE — Telephone Encounter (Signed)
I called and left voicemail for patient advised that prior authorization was submitted to [facility]. Her benefits were ran through the portal earlier this week. And unfortunately that they do require prior authorization and this was submitted to insurance. Pending insurance response. Once I hear back I will call the patient to advise their response.

## 2016-08-23 ENCOUNTER — Telehealth (INDEPENDENT_AMBULATORY_CARE_PROVIDER_SITE_OTHER): Payer: Self-pay | Admitting: Radiology

## 2016-08-23 NOTE — Telephone Encounter (Signed)
I called and left voicemail. Betty Green has received the prior authorization but cannot process because she has an orthovisc gel injection PA pending with another doctor. Advised her to call us and let us know what she would like to do.

## 2016-08-30 ENCOUNTER — Encounter (INDEPENDENT_AMBULATORY_CARE_PROVIDER_SITE_OTHER): Payer: Self-pay | Admitting: Family

## 2016-08-30 ENCOUNTER — Ambulatory Visit (INDEPENDENT_AMBULATORY_CARE_PROVIDER_SITE_OTHER): Payer: Medicare HMO | Admitting: Family

## 2016-08-30 DIAGNOSIS — M1712 Unilateral primary osteoarthritis, left knee: Secondary | ICD-10-CM

## 2016-08-30 DIAGNOSIS — M170 Bilateral primary osteoarthritis of knee: Secondary | ICD-10-CM

## 2016-08-30 DIAGNOSIS — M1711 Unilateral primary osteoarthritis, right knee: Secondary | ICD-10-CM | POA: Diagnosis not present

## 2016-08-30 MED ORDER — LIDOCAINE HCL 1 % IJ SOLN
1.0000 mL | INTRAMUSCULAR | Status: AC | PRN
  Administered 2016-08-30: 1 mL

## 2016-08-30 MED ORDER — HYLAN G-F 20 48 MG/6ML IX SOSY
48.0000 mg | PREFILLED_SYRINGE | INTRA_ARTICULAR | Status: AC | PRN
  Administered 2016-08-30: 48 mg via INTRA_ARTICULAR

## 2016-08-30 NOTE — Progress Notes (Signed)
Office Visit Note   Patient: Betty Green           Date of Birth: October 27, 1945           MRN: [id number] Visit Date: 08/30/2016              Requested by: Biagio Borg, MD [address] [place], [place] [address] PCP: Biagio Borg, MD  Chief Complaint  Patient presents with  . Right Knee - Pain  . Left Knee - Pain      HPI: The patient is a 71 year old woman who presents today for bilateral synvisc injections.  Assessment & Plan: Visit Diagnoses:  1. Bilateral primary osteoarthritis of knee     Plan: Synvisc injection bilateral knees today. Follow up in office as needed.  Follow-Up Instructions: Return if symptoms worsen or fail to improve.   Right Knee Exam   Tenderness  The patient is experiencing tenderness in the lateral joint line and medial joint line.  Tests  Varus: negative Valgus: negative  Other  Swelling: none Other tests: no effusion present   Left Knee Exam   Tenderness  The patient is experiencing tenderness in the medial joint line.  Tests  Varus: negative Valgus: negative  Other  Swelling: none Effusion: no effusion present      Patient is alert, oriented, no adenopathy, well-dressed, normal affect, normal respiratory effort.   Imaging: No results found.  Labs: Lab Results  Component Value Date   HGBA1C 5.7 02/01/2016   HGBA1C 5.6 08/24/2014   HGBA1C 5.5 07/22/2013   ESRSEDRATE 56 (H) 12/08/2014   CRP 3.4 12/08/2014    Orders:  No orders of the defined types were placed in this encounter.  No orders of the defined types were placed in this encounter.    Procedures: Large Joint Inj Date/Time: 08/30/2016 12:50 PM Performed by: Suzan Slick Authorized by: Dondra Prader R   Consent Given by:  Patient Site marked: the procedure site was marked   Timeout: prior to procedure the correct patient, procedure, and site was verified   Indications:  Pain and diagnostic evaluation Location:  Knee Site:  R  knee Needle Size:  18 G Needle Length:  1.5 inches Ultrasound Guidance: No   Fluoroscopic Guidance: No   Arthrogram: No   Medications:  1 mL lidocaine 1 %; 48 mg Hylan 48 MG/6ML Aspiration Attempted: No   Patient tolerance:  Patient tolerated the procedure well with no immediate complications Large Joint Inj Date/Time: 08/30/2016 12:51 PM Performed by: Suzan Slick Authorized by: Dondra Prader R   Consent Given by:  Patient Site marked: the procedure site was marked   Timeout: prior to procedure the correct patient, procedure, and site was verified   Indications:  Pain and diagnostic evaluation Location:  Knee Site:  L knee Needle Size:  18 G Needle Length:  1.5 inches Ultrasound Guidance: No   Fluoroscopic Guidance: No   Arthrogram: No   Medications:  1 mL lidocaine 1 %; 48 mg Hylan 48 MG/6ML Aspiration Attempted: No   Patient tolerance:  Patient tolerated the procedure well with no immediate complications    Clinical Data: No additional findings.  ROS:  All other systems negative, except as noted in the HPI. Review of Systems  Constitutional: Negative for chills and fever.  Musculoskeletal: Positive for arthralgias. Negative for joint swelling.    Objective: Vital Signs: There were no vitals taken for this visit.  Specialty Comments:  No  specialty comments available.  PMFS History: Patient Active Problem List   Diagnosis Date Noted  . Bilateral primary osteoarthritis of knee 08/10/2016  . Acute conjunctivitis of right eye 07/12/2016  . Acute sinus infection 07/12/2016  . Vitamin D deficiency 12/21/2015  . Dyspnea 12/14/2015  . Hyperglycemia 12/14/2015  . Sleep paralysis 11/29/2015  . Recurrent acute iridocyclitis of both eyes 12/11/2014  . Polyarthralgia 12/08/2014  . Ankle fracture 06/23/2014  . Morbid obesity ([place]) 03/24/2014  . Peripheral edema 10/20/2012  . Other and unspecified hyperlipidemia 08/04/2012  . Intertrigo 05/03/2012  . Eczema  09/19/2011  . Obstructive sleep apnea 06/04/2011  . Anxiety 05/31/2011  . Insomnia 05/31/2011  . Shoulder pain, bilateral 10/18/2010  . Seasonal and perennial allergic rhinitis   . Allergic asthma   . Depression   . Hypertension   . Arthritis   . Encounter for well adult exam with abnormal findings 10/13/2010   Past Medical History:  Diagnosis Date  . Allergy   . Anxiety    "take RX prn"  . Arthritis    "all over"  . Asthma   . Chronic bronchitis ([place])   . GERD (gastroesophageal reflux disease)   . Hypertension   . Insomnia 05/31/2011  . OSA on CPAP    wears CPAP sometimes (06/23/2014)  . Other and unspecified hyperlipidemia 08/04/2012  . PONV (postoperative nausea and vomiting)   . Shortness of breath dyspnea    with exertion  . Shoulder pain, bilateral 10/18/2010  . Vitamin D deficiency 12/21/2015    Family History  Problem Relation Age of Onset  . Arthritis Other   . Arthritis Mother   . Heart disease Mother   . Hypertension Mother   . Arthritis Father   . Heart disease Father   . Hypertension Father   . Cancer Other        colon and prostate cancer    Past Surgical History:  Procedure Laterality Date  . APPENDECTOMY  1966  . CHOLECYSTECTOMY OPEN  1981  . FRACTURE SURGERY    . ORIF ANKLE FRACTURE Right 06/23/2014  . ORIF ANKLE FRACTURE Right 06/23/2014   Procedure: Take Down Malunion Right Ankle, Open Reduction Internal Fixation Right Ankle, Possible Syndesmosis Repair, Medial Joint Debridement;  Surgeon: Newt Minion, MD;  Location: [facility];  Service: Orthopedics;  Laterality: Right;  . TONSILLECTOMY  1963  . TOTAL ABDOMINAL HYSTERECTOMY  1980   w/BSO   Social History   Occupational History  . retired [employer]   Social History Main Topics  . Smoking status: Former Smoker    Packs/day: 0.10    Years: 2.00    Types: Cigarettes    Quit date: 02/06/1984  . Smokeless tobacco: Never Used  . Alcohol use No  . Drug use: No  . Sexual activity: Not  Currently

## 2016-09-24 DIAGNOSIS — M1712 Unilateral primary osteoarthritis, left knee: Secondary | ICD-10-CM | POA: Diagnosis not present

## 2016-09-24 DIAGNOSIS — S46811A Strain of other muscles, fascia and tendons at shoulder and upper arm level, right arm, initial encounter: Secondary | ICD-10-CM | POA: Diagnosis not present

## 2016-09-24 DIAGNOSIS — M19032 Primary osteoarthritis, left wrist: Secondary | ICD-10-CM | POA: Diagnosis not present

## 2016-09-26 ENCOUNTER — Ambulatory Visit (INDEPENDENT_AMBULATORY_CARE_PROVIDER_SITE_OTHER): Payer: Medicare HMO | Admitting: Internal Medicine

## 2016-09-26 ENCOUNTER — Encounter: Payer: Self-pay | Admitting: Internal Medicine

## 2016-09-26 VITALS — BP 124/76 | HR 100 | Ht 62.0 in | Wt 287.0 lb

## 2016-09-26 DIAGNOSIS — R609 Edema, unspecified: Secondary | ICD-10-CM

## 2016-09-26 DIAGNOSIS — G4733 Obstructive sleep apnea (adult) (pediatric): Secondary | ICD-10-CM

## 2016-09-26 DIAGNOSIS — I10 Essential (primary) hypertension: Secondary | ICD-10-CM

## 2016-09-26 MED ORDER — MELOXICAM 15 MG PO TABS: 15.0000 mg | ORAL_TABLET | Freq: Every day | ORAL | 3 refills | Status: DC

## 2016-09-26 MED ORDER — FUROSEMIDE 40 MG PO TABS: ORAL_TABLET | ORAL | 3 refills | Status: DC

## 2016-09-26 MED ORDER — TRAMADOL HCL 50 MG PO TABS: 50.0000 mg | ORAL_TABLET | Freq: Three times a day (TID) | ORAL | 2 refills | Status: DC | PRN

## 2016-09-26 NOTE — Assessment & Plan Note (Signed)
Due for f/u, will refer pulmonary

## 2016-09-26 NOTE — Assessment & Plan Note (Addendum)
Has diast dysfnx on echo, but suspect this is more venous insufficiency as BNP has been normal, ok to try change HCT to lasix 20 qd, daily wts and f/u labs next visit, leg elevation, low salt diet, and wt control also encouraged, as well as compression stockings  Note:  Total time for pt hx, exam, review of record with pt in the room, determination of diagnoses and plan for further eval and tx is > 40 min, with over 50% spent in coordination and counseling of patient, including the differential dx, tx, further evaluation and management of peripheral edema, OSA, and HTN

## 2016-09-26 NOTE — Progress Notes (Signed)
Subjective:    Patient ID: Betty Green, female    DOB: 11-19-1945, 72 y.o.   MRN: [id number]  HPI  Here with persistent bilat LE edema not improved with HCT, may be some worse in last few weeks associated with wt gain; Pt denies chest pain, increased sob or doe, wheezing, orthopnea, PND, palpitations, dizziness or syncope.  Had similar c/o dec 2017 with normal BMP. April 2018 Echo with normal EF and grade 1 diast dysfxn. Pt denies new neurological symptoms such as new headache, or facial or extremity weakness or numbness   Pt denies polydipsia, polyuria,  Has hx of OSA but no regular f/u x 4 yrs, does use CPAP and will need new supplies soon. Does c/o ongoing fatigue, but denies signficant daytime hypersomnolence. Wt Readings from Last 3 Encounters:  09/26/16 287 lb (130.2 kg)  08/10/16 283 lb (128.4 kg)  07/12/16 283 lb (128.4 kg)   Past Medical History:  Diagnosis Date  . Allergy   . Anxiety    "take RX prn"  . Arthritis    "all over"  . Asthma   . Chronic bronchitis ([place])   . GERD (gastroesophageal reflux disease)   . Hypertension   . Insomnia 05/31/2011  . OSA on CPAP    wears CPAP sometimes (06/23/2014)  . Other and unspecified hyperlipidemia 08/04/2012  . PONV (postoperative nausea and vomiting)   . Shortness of breath dyspnea    with exertion  . Shoulder pain, bilateral 10/18/2010  . Vitamin D deficiency 12/21/2015   Past Surgical History:  Procedure Laterality Date  . APPENDECTOMY  1966  . CHOLECYSTECTOMY OPEN  1981  . FRACTURE SURGERY    . ORIF ANKLE FRACTURE Right 06/23/2014  . ORIF ANKLE FRACTURE Right 06/23/2014   Procedure: Take Down Malunion Right Ankle, Open Reduction Internal Fixation Right Ankle, Possible Syndesmosis Repair, Medial Joint Debridement;  Surgeon: Newt Minion, MD;  Location: [facility];  Service: Orthopedics;  Laterality: Right;  . TONSILLECTOMY  1963  . TOTAL ABDOMINAL HYSTERECTOMY  1980   w/BSO    reports that she quit smoking about 32 years ago.  Her smoking use included Cigarettes. She has a 0.20 pack-year smoking history. She has never used smokeless tobacco. She reports that she does not drink alcohol or use drugs. family history includes Arthritis in her father, mother, and other; Cancer in her other; Heart disease in her father and mother; Hypertension in her father and mother. Allergies  Allergen Reactions  . Amoxicillin-Pot Clavulanate     Hives  . Azithromycin Other (See Comments)    abd pain  . Lipitor [Atorvastatin] Itching   Current Outpatient Prescriptions on File Prior to Visit  Medication Sig Dispense Refill  . albuterol (PROAIR HFA) 108 (90 BASE) MCG/ACT inhaler Inhale 2 puffs into the lungs every 6 (six) hours as needed for wheezing. 1 Inhaler 11  . cycloSPORINE modified (NEORAL) 50 MG capsule Take 100 mg by mouth.    . folic acid (FOLVITE) 1 MG tablet Take 1 mg by mouth.    . hydrOXYzine (ATARAX/VISTARIL) 25 MG tablet TAKE 1-2 TABS BY MOUTH THREE TIMES PER DAY AS NEEDED FOR ITCHING 180 tablet 0  . losartan (COZAAR) 100 MG tablet Take 1 tablet (100 mg total) by mouth daily. 90 tablet 3  . methotrexate (RHEUMATREX) 2.5 MG tablet Take 25 mg by mouth.    . simvastatin (ZOCOR) 20 MG tablet TAKE 1 TABLET BY MOUTH AT BEDTIME 90 tablet 2   No current  facility-administered medications on file prior to visit.    Review of Systems  Constitutional: Negative for other unusual diaphoresis or sweats HENT: Negative for ear discharge or swelling Eyes: Negative for other worsening visual disturbances Respiratory: Negative for stridor or other swelling  Gastrointestinal: Negative for worsening distension or other blood Genitourinary: Negative for retention or other urinary change Musculoskeletal: Negative for other MSK pain or swelling Skin: Negative for color change or other new lesions Neurological: Negative for worsening tremors and other numbness  Psychiatric/Behavioral: Negative for worsening agitation or other fatigue All  other system neg per pt    Objective:   Physical Exam BP 124/76   Pulse 100   Ht 5\' 2"  (1.575 m)   Wt 287 lb (130.2 kg)   SpO2 97%   BMI 52.49 kg/m  VS noted,  Constitutional: Pt appears in NAD HENT: Head: NCAT.  Right Ear: External ear normal.  Left Ear: External ear normal.  Eyes: . Pupils are equal, round, and reactive to light. Conjunctivae and EOM are normal Nose: without d/c or deformity Neck: Neck supple. Gross normal ROM Cardiovascular: Normal rate and regular rhythm.   Pulmonary/Chest: Effort normal and breath sounds without rales or wheezing.  Abd:  Soft, NT, ND, + BS, no organomegaly Neurological: Pt is alert. At baseline orientation, motor grossly intact Skin: Skin is warm. No rashes, other new lesions, 1+ bilat LE edema Psychiatric: Pt behavior is normal without agitation  No other exam findings  Lab Results  Component Value Date   WBC 8.2 02/01/2016   HGB 12.3 02/01/2016   HCT 36.9 02/01/2016   PLT 295.0 02/01/2016   GLUCOSE 112 (H) 02/01/2016   CHOL 161 02/01/2016   TRIG 131.0 02/01/2016   HDL 61.50 02/01/2016   LDLDIRECT 134.1 05/02/2012   LDLCALC 73 02/01/2016   ALT 12 02/01/2016   AST 15 02/01/2016   NA 143 02/01/2016   K 4.6 02/01/2016   CL 105 02/01/2016   CREATININE 0.98 02/01/2016   BUN 14 02/01/2016   CO2 31 02/01/2016   TSH 0.86 02/01/2016   INR 1.05 06/22/2014   HGBA1C 5.7 02/01/2016   Dec 2017 ECG 12 lead - NSR  Also 01-31-2017 Pro B Natriuretic peptide (BNP) 0.0 - 100.0 pg/mL 77.0    Transthoracic Echocardiography - summary Study Date: 05/18/2016 Study Conclusions - Left ventricle: The cavity size was normal. Systolic function was   normal. The estimated ejection fraction was in the range of 60%   to 65%. Wall motion was normal; there were no regional wall   motion abnormalities. Doppler parameters are consistent with   abnormal left ventricular relaxation (grade 1 diastolic   dysfunction). - Aortic valve: Transvalvular  velocity was within the normal range.   There was no stenosis. There was no regurgitation. - Mitral valve: Transvalvular velocity was within the normal range.   There was no evidence for stenosis. There was trivial   regurgitation. - Right ventricle: The cavity size was normal. Wall thickness was   normal. Systolic function was normal. - Atrial septum: No defect or patent foramen ovale was identified   by color flow Doppler. - Tricuspid valve: There was trivial regurgitation. - Pulmonary arteries: Systolic pressure was within the normal   range. PA peak pressure: 32 mm Hg (S).     Assessment & Plan:

## 2016-09-26 NOTE — Patient Instructions (Addendum)
OK to stop the HCT fluid pill  Please take all new medication as prescribed - the lasix (furosemide)  Please continue all other medications as before, and refills have been done if requested - the mobic and tramadol  You will be contacted regarding the referral for: pulmonary  Please have the pharmacy call with any other refills you may need.  Please continue your efforts at being more active, low cholesterol diet, and weight control.  Please keep your appointments with your specialists as you may have planned  Please return in 3 months, or sooner if needed, with Lab testing done 3-5 days before

## 2016-09-26 NOTE — Assessment & Plan Note (Signed)
BP Readings from Last 3 Encounters:  09/26/16 124/76  07/12/16 122/88  04/12/16 130/72  stable overall by history and exam, recent data reviewed with pt, and pt to continue medical treatment as before,  to f/u any worsening symptoms or concerns

## 2016-10-01 ENCOUNTER — Other Ambulatory Visit: Payer: Self-pay | Admitting: Internal Medicine

## 2016-10-03 ENCOUNTER — Telehealth: Payer: Self-pay

## 2016-10-03 NOTE — Telephone Encounter (Signed)
Submitted on 10/02/16 Approved through 02/04/17  Determination send to scan dept.

## 2016-11-14 ENCOUNTER — Encounter: Payer: Self-pay | Admitting: Internal Medicine

## 2016-11-14 ENCOUNTER — Ambulatory Visit (INDEPENDENT_AMBULATORY_CARE_PROVIDER_SITE_OTHER): Payer: Medicare HMO | Admitting: Internal Medicine

## 2016-11-14 VITALS — BP 144/86 | HR 89 | Temp 97.8°F | Ht 62.0 in | Wt 297.0 lb

## 2016-11-14 DIAGNOSIS — Z1159 Encounter for screening for other viral diseases: Secondary | ICD-10-CM

## 2016-11-14 DIAGNOSIS — Z23 Encounter for immunization: Secondary | ICD-10-CM | POA: Diagnosis not present

## 2016-11-14 DIAGNOSIS — Z0001 Encounter for general adult medical examination with abnormal findings: Secondary | ICD-10-CM | POA: Diagnosis not present

## 2016-11-14 DIAGNOSIS — R600 Localized edema: Secondary | ICD-10-CM

## 2016-11-14 DIAGNOSIS — I10 Essential (primary) hypertension: Secondary | ICD-10-CM

## 2016-11-14 DIAGNOSIS — E2839 Other primary ovarian failure: Secondary | ICD-10-CM

## 2016-11-14 DIAGNOSIS — R739 Hyperglycemia, unspecified: Secondary | ICD-10-CM | POA: Diagnosis not present

## 2016-11-14 DIAGNOSIS — R609 Edema, unspecified: Secondary | ICD-10-CM

## 2016-11-14 MED ORDER — LORCASERIN HCL 10 MG PO TABS: ORAL_TABLET | ORAL | 1 refills | Status: DC

## 2016-11-14 MED ORDER — FUROSEMIDE 80 MG PO TABS: 80.0000 mg | ORAL_TABLET | Freq: Two times a day (BID) | ORAL | 3 refills | Status: DC

## 2016-11-14 MED ORDER — POTASSIUM CHLORIDE CRYS ER 10 MEQ PO TBCR: 10.0000 meq | EXTENDED_RELEASE_TABLET | Freq: Every day | ORAL | 3 refills | Status: DC

## 2016-11-14 NOTE — Assessment & Plan Note (Addendum)
?   diast chf vs venous insuffuciency, for BNP, and trial lasix 80 bid, and kdur 10 qd,  to f/u any worsening symptoms or concerns  In addition to the time spent performing CPE, I spent an additional 25 minutes face to face,in which greater than 50% of this time was spent in counseling and coordination of care for patient's acute illness as documented, including the differential dx, tx further evaluation and other management of peripheral edema, morbid obesity, hyperglycemia, and HTN

## 2016-11-14 NOTE — Assessment & Plan Note (Signed)
stable overall by history and exam, recent data reviewed with pt, and pt to continue medical treatment as before,  to f/u any worsening symptoms or concerns Lab Results  Component Value Date   HGBA1C 5.7 02/01/2016

## 2016-11-14 NOTE — Assessment & Plan Note (Signed)
Ok for belviq asd,  to f/u any worsening symptoms or concerns

## 2016-11-14 NOTE — Assessment & Plan Note (Signed)

## 2016-11-14 NOTE — Assessment & Plan Note (Signed)
stable overall by history and exam, recent data reviewed with pt, and pt to continue medical treatment as before,  to f/u any worsening symptoms or concerns BP Readings from Last 3 Encounters:  11/14/16 (!) 144/86  09/26/16 124/76  07/12/16 122/88

## 2016-11-14 NOTE — Patient Instructions (Signed)
Please take all new medication as prescribed - the [place] to increase the lasix to 80 mg twice per day  Please take all new medication as prescribed - the potassium pill to go with the increased lasix  You are given the prescription for the compression stockings  Please continue to try to keep legs elevated with sitting, low salt diet, and weight loss  Please continue all other medications as before, and refills have been done if requested.  Please have the pharmacy call with any other refills you may need.  Please continue your efforts at being more active, low cholesterol diet, and weight control.  You are otherwise up to date with prevention measures today.  Please keep your appointments with your specialists as you may have planned  Please schedule the bone density test before leaving today at the scheduling desk (where you check out)  Please go to the LAB in the Basement (turn left off the elevator) for the tests to be done today  You will be contacted by phone if any changes need to be made immediately.  Otherwise, you will receive a letter about your results with an explanation, but please check with MyChart first.  Please remember to sign up for MyChart if you have not done so, as this will be important to you in the future with finding out test results, communicating by private email, and scheduling acute appointments online when needed.

## 2016-11-14 NOTE — Progress Notes (Signed)
Subjective:    Patient ID: Betty Green, female    DOB: 1945/07/04, 71 y.o.   MRN: [id number]  HPI     Here for wellness and f/u;  Overall doing ok;  Pt denies neurological change such as new headache, facial or extremity weakness.  Pt denies polydipsia, polyuria, or low sugar symptoms. Pt states overall good compliance with treatment and medications, good tolerability, and has been trying to follow appropriate diet.  Pt denies worsening depressive symptoms, suicidal ideation or panic. No fever, night sweats, wt loss, loss of appetite, or other constitutional symptoms.  Pt states good ability with ADL's, has low fall risk, home safety reviewed and adequate, no other significant changes in hearing or vision, and not active with exercise.  Plans to call soon for mammogram. Has the cologuard kit, and plans to submit soon.  Wt has increased with worsening LE edema Wt Readings from Last 3 Encounters:  11/14/16 297 lb (134.7 kg)  09/26/16 287 lb (130.2 kg)  08/10/16 283 lb (128.4 kg)   BP Readings from Last 3 Encounters:  11/14/16 (!) 144/86  09/26/16 124/76  07/12/16 122/88  Pt denies chest pain, increased sob or doe, wheezing, orthopnea, PND, palpitations, dizziness or syncope.  Had similar c/o dec 2017 with normal BMP. April 2018 Echo with normal EF and grade 1 diast dysfxn, but Pt denies chest pain, increased sob or doe, wheezing, orthopnea, PND, palpitations, dizziness or syncope.  Has been unable to lose wt, asking for belviq trial.  Due for DXA. Past Medical History:  Diagnosis Date  . Allergy   . Anxiety    "take RX prn"  . Arthritis    "all over"  . Asthma   . Chronic bronchitis ([place])   . GERD (gastroesophageal reflux disease)   . Hypertension   . Insomnia 05/31/2011  . OSA on CPAP    wears CPAP sometimes (06/23/2014)  . Other and unspecified hyperlipidemia 08/04/2012  . PONV (postoperative nausea and vomiting)   . Shortness of breath dyspnea    with exertion  . Shoulder pain,  bilateral 10/18/2010  . Vitamin D deficiency 12/21/2015   Past Surgical History:  Procedure Laterality Date  . APPENDECTOMY  1966  . CHOLECYSTECTOMY OPEN  1981  . FRACTURE SURGERY    . ORIF ANKLE FRACTURE Right 06/23/2014  . ORIF ANKLE FRACTURE Right 06/23/2014   Procedure: Take Down Malunion Right Ankle, Open Reduction Internal Fixation Right Ankle, Possible Syndesmosis Repair, Medial Joint Debridement;  Surgeon: Newt Minion, MD;  Location: [facility];  Service: Orthopedics;  Laterality: Right;  . TONSILLECTOMY  1963  . TOTAL ABDOMINAL HYSTERECTOMY  1980   w/BSO    reports that she quit smoking about 32 years ago. Her smoking use included Cigarettes. She has a 0.20 pack-year smoking history. She has never used smokeless tobacco. She reports that she does not drink alcohol or use drugs. family history includes Arthritis in her father, mother, and other; Cancer in her other; Heart disease in her father and mother; Hypertension in her father and mother. Allergies  Allergen Reactions  . Amoxicillin-Pot Clavulanate     Hives  . Azithromycin Other (See Comments)    abd pain  . Lipitor [Atorvastatin] Itching   Current Outpatient Prescriptions on File Prior to Visit  Medication Sig Dispense Refill  . albuterol (PROAIR HFA) 108 (90 BASE) MCG/ACT inhaler Inhale 2 puffs into the lungs every 6 (six) hours as needed for wheezing. 1 Inhaler 11  . cycloSPORINE modified (  NEORAL) 50 MG capsule Take 100 mg by mouth.    . folic acid (FOLVITE) 1 MG tablet Take 1 mg by mouth.    . hydrOXYzine (ATARAX/VISTARIL) 25 MG tablet TAKE 1-2 TABS BY MOUTH THREE TIMES PER DAY AS NEEDED FOR ITCHING 180 tablet 1  . losartan (COZAAR) 100 MG tablet Take 1 tablet (100 mg total) by mouth daily. 90 tablet 3  . meloxicam (MOBIC) 15 MG tablet Take 1 tablet (15 mg total) by mouth daily. with food 90 tablet 3  . methotrexate (RHEUMATREX) 2.5 MG tablet Take 25 mg by mouth.    . simvastatin (ZOCOR) 20 MG tablet TAKE 1 TABLET BY  MOUTH AT BEDTIME 90 tablet 2  . traMADol (ULTRAM) 50 MG tablet Take 1 tablet (50 mg total) by mouth every 8 (eight) hours as needed. 120 tablet 2   No current facility-administered medications on file prior to visit.    Review of Systems Constitutional: Negative for other unusual diaphoresis, sweats, appetite or weight changes HENT: Negative for other worsening hearing loss, ear pain, facial swelling, mouth sores or neck stiffness.   Eyes: Negative for other worsening pain, redness or other visual disturbance.  Respiratory: Negative for other stridor or swelling Cardiovascular: Negative for other palpitations or other chest pain  Gastrointestinal: Negative for worsening diarrhea or loose stools, blood in stool, distention or other pain Genitourinary: Negative for hematuria, flank pain or other change in urine volume.  Musculoskeletal: Negative for myalgias or other joint swelling.  Skin: Negative for other color change, or other wound or worsening drainage.  Neurological: Negative for other syncope or numbness. Hematological: Negative for other adenopathy or swelling Psychiatric/Behavioral: Negative for hallucinations, other worsening agitation, SI, self-injury, or new decreased concentration All other system neg per pt    Objective:   Physical Exam BP (!) 144/86   Pulse 89   Temp 97.8 F (36.6 C) (Oral)   Ht _0  (1.575 m)   Wt 297 lb (134.7 kg)   SpO2 99%   BMI 54.32 kg/m  VS noted, supermorbid obese Constitutional: Pt is oriented to person, place, and time. Appears well-developed and well-nourished, in no significant distress and comfortable Head: Normocephalic and atraumatic  Eyes: Conjunctivae and EOM are normal. Pupils are equal, round, and reactive to light Right Ear: External ear normal without discharge Left Ear: External ear normal without discharge Nose: Nose without discharge or deformity Mouth/Throat: Oropharynx is without other ulcerations and moist  Neck: Normal  range of motion. Neck supple. No JVD present. No tracheal deviation present or significant neck LA or mass Cardiovascular: Normal rate, regular rhythm, normal heart sounds and intact distal pulses.   Pulmonary/Chest: WOB normal and breath sounds without rales or wheezing  Abdominal: Soft. Bowel sounds are normal. NT. No HSM  Musculoskeletal: Normal range of motion. Exhibits 2-3+  bilat LE edema Lymphadenopathy: Has no other cervical adenopathy.  Neurological: Pt is alert and oriented to person, place, and time. Pt has normal reflexes. No cranial nerve deficit. Motor grossly intact, Gait intact Skin: Skin is warm and dry. No rash noted or new ulcerations Psychiatric:  Has normal mood and affect. Behavior is normal without agitation No other exam findings  Lab Results  Component Value Date   WBC 8.2 02/01/2016   HGB 12.3 02/01/2016   HCT 36.9 02/01/2016   PLT 295.0 02/01/2016   GLUCOSE 112 (H) 02/01/2016   CHOL 161 02/01/2016   TRIG 131.0 02/01/2016   HDL 61.50 02/01/2016   LDLDIRECT 134.1 05/02/2012  LDLCALC 73 02/01/2016   ALT 12 02/01/2016   AST 15 02/01/2016   NA 143 02/01/2016   K 4.6 02/01/2016   CL 105 02/01/2016   CREATININE 0.98 02/01/2016   BUN 14 02/01/2016   CO2 31 02/01/2016   TSH 0.86 02/01/2016   INR 1.05 06/22/2014   HGBA1C 5.7 02/01/2016       Assessment & Plan:

## 2016-11-21 ENCOUNTER — Inpatient Hospital Stay: Admission: RE | Admit: 2016-11-21 | Payer: Medicare HMO | Source: Ambulatory Visit

## 2016-11-23 ENCOUNTER — Other Ambulatory Visit: Payer: Self-pay | Admitting: Internal Medicine

## 2016-11-23 DIAGNOSIS — R609 Edema, unspecified: Secondary | ICD-10-CM

## 2016-12-06 ENCOUNTER — Encounter: Payer: Self-pay | Admitting: Internal Medicine

## 2016-12-18 ENCOUNTER — Other Ambulatory Visit (INDEPENDENT_AMBULATORY_CARE_PROVIDER_SITE_OTHER): Payer: Medicare HMO

## 2016-12-18 DIAGNOSIS — R609 Edema, unspecified: Secondary | ICD-10-CM | POA: Diagnosis not present

## 2016-12-18 DIAGNOSIS — Z1159 Encounter for screening for other viral diseases: Secondary | ICD-10-CM

## 2016-12-18 DIAGNOSIS — Z0001 Encounter for general adult medical examination with abnormal findings: Secondary | ICD-10-CM

## 2016-12-18 DIAGNOSIS — R739 Hyperglycemia, unspecified: Secondary | ICD-10-CM

## 2016-12-18 LAB — LIPID PANEL
Cholesterol: 204 mg/dL — ABNORMAL HIGH (ref 0–200)
HDL: 57.5 mg/dL (ref >39.00)
LDL CALC: 116 mg/dL — AB (ref 0–99)
NONHDL: 146.58
Total CHOL/HDL Ratio: 4
Triglycerides: 154 mg/dL — ABNORMAL HIGH (ref 0.0–149.0)
VLDL: 30.8 mg/dL (ref 0.0–40.0)

## 2016-12-18 LAB — CBC WITH DIFFERENTIAL/PLATELET
BASOS ABS: 0 10*3/uL (ref 0.0–0.1)
BASOS PCT: 0.5 % (ref 0.0–3.0)
EOS ABS: 0.2 10*3/uL (ref 0.0–0.7)
Eosinophils Relative: 2.7 % (ref 0.0–5.0)
HCT: 39.7 % (ref 36.0–46.0)
HEMOGLOBIN: 13 g/dL (ref 12.0–15.0)
LYMPHS PCT: 32.7 % (ref 12.0–46.0)
Lymphs Abs: 2.6 10*3/uL (ref 0.7–4.0)
MCHC: 32.7 g/dL (ref 30.0–36.0)
MCV: 91 fl (ref 78.0–100.0)
MONO ABS: 0.7 10*3/uL (ref 0.1–1.0)
Monocytes Relative: 9.5 % (ref 3.0–12.0)
Neutro Abs: 4.3 10*3/uL (ref 1.4–7.7)
Neutrophils Relative %: 54.6 % (ref 43.0–77.0)
Platelets: 292 10*3/uL (ref 150.0–400.0)
RBC: 4.36 Mil/uL (ref 3.87–5.11)
RDW: 15 % (ref 11.5–15.5)
WBC: 7.8 10*3/uL (ref 4.0–10.5)

## 2016-12-18 LAB — BASIC METABOLIC PANEL
BUN: 15 mg/dL (ref 6–23)
CHLORIDE: 104 meq/L (ref 96–112)
CO2: 30 mEq/L (ref 19–32)
CREATININE: 0.93 mg/dL (ref 0.40–1.20)
Calcium: 9.9 mg/dL (ref 8.4–10.5)
GFR: 76.1 mL/min (ref >60.00)
GLUCOSE: 87 mg/dL (ref 70–99)
POTASSIUM: 3.9 meq/L (ref 3.5–5.1)
Sodium: 140 mEq/L (ref 135–145)

## 2016-12-18 LAB — HEPATIC FUNCTION PANEL
ALK PHOS: 76 U/L (ref 39–117)
ALT: 12 U/L (ref 0–35)
AST: 15 U/L (ref 0–37)
Albumin: 3.7 g/dL (ref 3.5–5.2)
BILIRUBIN DIRECT: 0.1 mg/dL (ref 0.0–0.3)
BILIRUBIN TOTAL: 0.4 mg/dL (ref 0.2–1.2)
Total Protein: 7.5 g/dL (ref 6.0–8.3)

## 2016-12-18 LAB — URINALYSIS, ROUTINE W REFLEX MICROSCOPIC
BILIRUBIN URINE: NEGATIVE
HGB URINE DIPSTICK: NEGATIVE
KETONES UR: NEGATIVE
LEUKOCYTES UA: NEGATIVE
NITRITE: NEGATIVE
PH: 6 (ref 5.0–8.0)
Specific Gravity, Urine: >=1.03 — AB (ref 1.000–1.030)
Total Protein, Urine: NEGATIVE
Urine Glucose: NEGATIVE
Urobilinogen, UA: 0.2 (ref 0.0–1.0)

## 2016-12-18 LAB — HEMOGLOBIN A1C: HEMOGLOBIN A1C: 5.8 % (ref 4.6–6.5)

## 2016-12-18 LAB — TSH: TSH: 1.25 u[IU]/mL (ref 0.35–4.50)

## 2016-12-18 LAB — BRAIN NATRIURETIC PEPTIDE: Pro B Natriuretic peptide (BNP): 29 pg/mL (ref 0.0–100.0)

## 2016-12-19 LAB — HEPATITIS C ANTIBODY
Hepatitis C Ab: NONREACTIVE
SIGNAL TO CUT-OFF: 0.8 (ref <1.00)

## 2016-12-31 ENCOUNTER — Telehealth: Payer: Self-pay | Admitting: Internal Medicine

## 2016-12-31 DIAGNOSIS — H359 Unspecified retinal disorder: Secondary | ICD-10-CM

## 2016-12-31 NOTE — Telephone Encounter (Signed)
Ok done

## 2016-12-31 NOTE — Telephone Encounter (Signed)
Pt called asking for a referral to a Retina Specialist. Please advise.

## 2017-01-01 ENCOUNTER — Ambulatory Visit: Payer: Medicare HMO | Admitting: Internal Medicine

## 2017-01-01 DIAGNOSIS — H43812 Vitreous degeneration, left eye: Secondary | ICD-10-CM | POA: Diagnosis not present

## 2017-01-01 DIAGNOSIS — H35371 Puckering of macula, right eye: Secondary | ICD-10-CM | POA: Diagnosis not present

## 2017-01-01 DIAGNOSIS — H25011 Cortical age-related cataract, right eye: Secondary | ICD-10-CM | POA: Diagnosis not present

## 2017-01-07 ENCOUNTER — Other Ambulatory Visit: Payer: Self-pay | Admitting: Internal Medicine

## 2017-01-07 DIAGNOSIS — H17823 Peripheral opacity of cornea, bilateral: Secondary | ICD-10-CM | POA: Diagnosis not present

## 2017-01-07 DIAGNOSIS — H16223 Keratoconjunctivitis sicca, not specified as Sjogren's, bilateral: Secondary | ICD-10-CM | POA: Diagnosis not present

## 2017-01-07 DIAGNOSIS — H01024 Squamous blepharitis left upper eyelid: Secondary | ICD-10-CM | POA: Diagnosis not present

## 2017-01-07 DIAGNOSIS — H01025 Squamous blepharitis left lower eyelid: Secondary | ICD-10-CM | POA: Diagnosis not present

## 2017-01-07 DIAGNOSIS — H01021 Squamous blepharitis right upper eyelid: Secondary | ICD-10-CM | POA: Diagnosis not present

## 2017-01-07 DIAGNOSIS — H2513 Age-related nuclear cataract, bilateral: Secondary | ICD-10-CM | POA: Diagnosis not present

## 2017-01-07 DIAGNOSIS — Z1231 Encounter for screening mammogram for malignant neoplasm of breast: Secondary | ICD-10-CM

## 2017-01-07 DIAGNOSIS — H01022 Squamous blepharitis right lower eyelid: Secondary | ICD-10-CM | POA: Diagnosis not present

## 2017-01-08 ENCOUNTER — Institutional Professional Consult (permissible substitution): Payer: Medicare HMO | Admitting: Pulmonary Disease

## 2017-01-08 ENCOUNTER — Encounter: Payer: Self-pay | Admitting: Pulmonary Disease

## 2017-01-08 ENCOUNTER — Ambulatory Visit (INDEPENDENT_AMBULATORY_CARE_PROVIDER_SITE_OTHER): Payer: Medicare HMO | Admitting: Pulmonary Disease

## 2017-01-08 DIAGNOSIS — G478 Other sleep disorders: Secondary | ICD-10-CM | POA: Diagnosis not present

## 2017-01-08 DIAGNOSIS — G4733 Obstructive sleep apnea (adult) (pediatric): Secondary | ICD-10-CM

## 2017-01-08 NOTE — Assessment & Plan Note (Signed)
Due to snoring and residual sleepiness, it does seem that she needs higher pressure due to weight gain.  Since we are not able to obtain auto CPAP, we can increase her pressure empirically to 14 cm  Check download from your CPAP machine Increase CPAP pressure to 14 cm -[facility]. Repeat download in 1 month  Weight loss encouraged, compliance with goal of at least 4-6 hrs every night is the expectation. Advised against medications with sedative side effects Cautioned against driving when sleepy - understanding that sleepiness will vary on a day to day basis

## 2017-01-08 NOTE — Assessment & Plan Note (Signed)
We will have to treat OSA adequately and then reassess.  Doubt any medications required for this

## 2017-01-08 NOTE — Patient Instructions (Signed)
Check download from your CPAP machine Increase CPAP pressure to 14 cm -[facility]. Repeat download in 1 month

## 2017-01-08 NOTE — Progress Notes (Signed)
   Subjective:    Patient ID: Betty Green, female    DOB: 1945-03-18, 71 y.o.   MRN: [id number]  HPI  71 year old woman for follow-up of OSA and to establish care with me, previously has seen my partner Also has a history of sleep paralysis since 1963  On her last visit 04/2016 she was supposed to be set up with auto CPAP, but this never happened.  She is using the same supplies from when she obtained her machine in 2013 . No download has been checked at any point.  Bedtime is between 1:49 AM, sleep latency is about 2 hours, reports 2-3 nocturnal awakenings and is out of bed by 9 AM.  Continues to feel tired during the daytime and naps often. Snoring has been noted even while on CPAP by her sister who accompanies her  She has gained about 24 pounds since her sleep study She reports episodes where she cannot move that wakes her up from sleep about 2-3 times a year   Significant tests/ events reviewed  07/2011 NPSG  (271 lbs )AHI of 37.>> cpap 12 cm water.   Past Medical History:  Diagnosis Date  . Allergy   . Anxiety    "take RX prn"  . Arthritis    "all over"  . Asthma   . Chronic bronchitis ([place])   . GERD (gastroesophageal reflux disease)   . Hypertension   . Insomnia 05/31/2011  . OSA on CPAP    wears CPAP sometimes (06/23/2014)  . Other and unspecified hyperlipidemia 08/04/2012  . PONV (postoperative nausea and vomiting)   . Shortness of breath dyspnea    with exertion  . Shoulder pain, bilateral 10/18/2010  . Vitamin D deficiency 12/21/2015     Review of Systems Positive for shortness of breath with activity, acid heartburn, nasal congestion, joint stiffness  neg for any significant sore throat, dysphagia, itching, sneezing, nasal congestion or excess/ purulent secretions, fever, chills, sweats, unintended wt loss, pleuritic or exertional cp, hempoptysis, orthopnea pnd or change in chronic leg swelling. Also denies presyncope, palpitations, heartburn, abdominal pain,  nausea, vomiting, diarrhea or change in bowel or urinary habits, dysuria,hematuria, rash, arthralgias, visual complaints, headache, numbness weakness or ataxia.     Objective:   Physical Exam        Assessment & Plan:

## 2017-01-08 NOTE — Addendum Note (Signed)
Addended by: Valerie Salts on: 01/08/2017 11:31 AM   Modules accepted: Orders

## 2017-01-18 ENCOUNTER — Other Ambulatory Visit: Payer: Self-pay | Admitting: Internal Medicine

## 2017-01-18 NOTE — Telephone Encounter (Signed)
I think in this case that the doxy is not usually a refillable medication, please consider OV if pt believes she has an infecton that might warrant this

## 2017-02-07 ENCOUNTER — Ambulatory Visit
Admission: RE | Admit: 2017-02-07 | Discharge: 2017-02-07 | Disposition: A | Discharge status: Home / Self Care | Payer: Medicare HMO | Source: Ambulatory Visit | Attending: Internal Medicine | Admitting: Internal Medicine

## 2017-02-07 DIAGNOSIS — Z1231 Encounter for screening mammogram for malignant neoplasm of breast: Secondary | ICD-10-CM | POA: Diagnosis not present

## 2017-02-07 DIAGNOSIS — M810 Age-related osteoporosis without current pathological fracture: Secondary | ICD-10-CM | POA: Diagnosis not present

## 2017-02-07 DIAGNOSIS — Z780 Asymptomatic menopausal state: Secondary | ICD-10-CM | POA: Diagnosis not present

## 2017-02-07 DIAGNOSIS — E2839 Other primary ovarian failure: Secondary | ICD-10-CM

## 2017-02-10 ENCOUNTER — Encounter: Payer: Self-pay | Admitting: Internal Medicine

## 2017-02-10 DIAGNOSIS — M810 Age-related osteoporosis without current pathological fracture: Secondary | ICD-10-CM | POA: Insufficient documentation

## 2017-02-11 ENCOUNTER — Telehealth: Payer: Self-pay

## 2017-02-11 MED ORDER — ALENDRONATE SODIUM 70 MG PO TABS: 70.0000 mg | ORAL_TABLET | ORAL | 3 refills | Status: DC

## 2017-02-11 NOTE — Telephone Encounter (Signed)
Start prolia per dr Leodis Liverpool will be verified and I will call patient to discuss summary of benefits

## 2017-02-11 NOTE — Telephone Encounter (Signed)
-----   Message from Biagio Borg, MD sent at 02/10/2017 10:12 AM EST ----- Left message on MyChart, pt to cont same tx except  Ms Saksa, this is Dr Jenny Reichmann to let you know that your recent Bone Density test is consistent with Osteoporosis.  I would like to start Prolia injections, as this is a shot that can be done every 6 months in order to help build the bone back up.  I will ask our chief Nurse at the office to call you to see if this is ok.  If you like, we can discuss this further at an Office Visit if you like.  Thanks  Adrik Khim to please inform pt, she should hear from [place] hopefully soon

## 2017-02-11 NOTE — Telephone Encounter (Signed)
Pt is not interested in starting Prolia. She has done research and does not like some of the side effects that she seen. She would like to know other options. Please advise.

## 2017-02-11 NOTE — Telephone Encounter (Signed)
[place] for fosamax instead - done erx

## 2017-02-11 NOTE — Telephone Encounter (Signed)
-----   Message from Biagio Borg, MD sent at 02/10/2017 10:11 AM EST ----- Regarding: new osteoporosis by dxa Jonelle Sidle, to please look into copay for new prolia start for this patient, thanks

## 2017-02-11 NOTE — Addendum Note (Signed)
Addended by: Biagio Borg on: 02/11/2017 12:55 PM   Modules accepted: Orders

## 2017-02-18 DIAGNOSIS — H01025 Squamous blepharitis left lower eyelid: Secondary | ICD-10-CM | POA: Diagnosis not present

## 2017-02-18 DIAGNOSIS — H01022 Squamous blepharitis right lower eyelid: Secondary | ICD-10-CM | POA: Diagnosis not present

## 2017-02-18 DIAGNOSIS — H2513 Age-related nuclear cataract, bilateral: Secondary | ICD-10-CM | POA: Diagnosis not present

## 2017-02-18 DIAGNOSIS — H04123 Dry eye syndrome of bilateral lacrimal glands: Secondary | ICD-10-CM | POA: Diagnosis not present

## 2017-02-18 DIAGNOSIS — H01024 Squamous blepharitis left upper eyelid: Secondary | ICD-10-CM | POA: Diagnosis not present

## 2017-02-18 DIAGNOSIS — H01021 Squamous blepharitis right upper eyelid: Secondary | ICD-10-CM | POA: Diagnosis not present

## 2017-02-18 DIAGNOSIS — H17823 Peripheral opacity of cornea, bilateral: Secondary | ICD-10-CM | POA: Diagnosis not present

## 2017-03-05 ENCOUNTER — Ambulatory Visit (INDEPENDENT_AMBULATORY_CARE_PROVIDER_SITE_OTHER): Payer: Medicare HMO | Admitting: Family Medicine

## 2017-03-05 ENCOUNTER — Encounter: Payer: Self-pay | Admitting: Family Medicine

## 2017-03-05 VITALS — BP 138/84 | HR 96 | Temp 98.7°F | Ht 62.0 in | Wt 294.0 lb

## 2017-03-05 DIAGNOSIS — M25512 Pain in left shoulder: Secondary | ICD-10-CM

## 2017-03-05 DIAGNOSIS — M25511 Pain in right shoulder: Secondary | ICD-10-CM

## 2017-03-05 DIAGNOSIS — M170 Bilateral primary osteoarthritis of knee: Secondary | ICD-10-CM | POA: Diagnosis not present

## 2017-03-05 DIAGNOSIS — G8929 Other chronic pain: Secondary | ICD-10-CM

## 2017-03-05 NOTE — Progress Notes (Signed)
Betty Green - 72 y.o. female MRN [id number]  Date of birth: 01/03/1946  SUBJECTIVE:  Including CC & ROS.  Chief Complaint  Patient presents with  . Bilateral knee pain  . Shoulder Pain    Betty Green is a 72 y.o. female that is here for an evaluation of bilateral knee pain and right shoulder pain. Pain has been chronic, increasing over the past month. Pain is constant stabbing pain. Located the the anterior aspect bilaterally. Denies certain movements that trigger the pain. Denies injury or surgeries. She has been taking Mobic and tramadol with no improvement. Right shoulder pain is described as ache. Pain is present when she moves her arm. Denies tingling or numbness.   Having lateral right shoulder pain. Pain is acute on chronic in nature. Has not tried anything for pain. Feels like the pain worse with certain abduction movements. Pain is moderate in nature.      Review of Systems  Constitutional: Negative for fever.  HENT: Negative for sinus pain.   Respiratory: Negative for shortness of breath.   Cardiovascular: Negative for chest pain.  Gastrointestinal: Negative for abdominal pain.  Musculoskeletal: Positive for arthralgias and back pain.  Skin: Negative for color change.  Neurological: Negative for weakness.  Hematological: Negative for adenopathy.  Psychiatric/Behavioral: Negative for agitation.    HISTORY: Past Medical, Surgical, Social, and Family History Reviewed & Updated per EMR.   Pertinent Historical Findings include:  Past Medical History:  Diagnosis Date  . Allergy   . Anxiety    "take RX prn"  . Arthritis    "all over"  . Asthma   . Chronic bronchitis ([place])   . GERD (gastroesophageal reflux disease)   . Hypertension   . Insomnia 05/31/2011  . OSA on CPAP    wears CPAP sometimes (06/23/2014)  . Other and unspecified hyperlipidemia 08/04/2012  . PONV (postoperative nausea and vomiting)   . Shortness of breath dyspnea    with exertion  . Shoulder  pain, bilateral 10/18/2010  . Vitamin D deficiency 12/21/2015    Past Surgical History:  Procedure Laterality Date  . APPENDECTOMY  1966  . BREAST EXCISIONAL BIOPSY Right   . CHOLECYSTECTOMY OPEN  1981  . FRACTURE SURGERY    . ORIF ANKLE FRACTURE Right 06/23/2014  . ORIF ANKLE FRACTURE Right 06/23/2014   Procedure: Take Down Malunion Right Ankle, Open Reduction Internal Fixation Right Ankle, Possible Syndesmosis Repair, Medial Joint Debridement;  Surgeon: Newt Minion, MD;  Location: [facility];  Service: Orthopedics;  Laterality: Right;  . TONSILLECTOMY  1963  . TOTAL ABDOMINAL HYSTERECTOMY  1980   w/BSO    Allergies  Allergen Reactions  . Amoxicillin-Pot Clavulanate     Hives  . Azithromycin Other (See Comments)    abd pain  . Lipitor [Atorvastatin] Itching    Family History  Problem Relation Age of Onset  . Arthritis Other   . Arthritis Mother   . Heart disease Mother   . Hypertension Mother   . Arthritis Father   . Heart disease Father   . Hypertension Father   . Cancer Other        colon and prostate cancer     Social History   Socioeconomic History  . Marital status: Single    Spouse name: Not on file  . Number of children: 2  . Years of education: 29  . Highest education level: Not on file  Social Needs  . Financial resource strain: Not on file  .  Food insecurity - worry: Not on file  . Food insecurity - inability: Not on file  . Transportation needs - medical: Not on file  . Transportation needs - non-medical: Not on file  Occupational History  . Occupation: retired    Fish farm manager: [employer]  Tobacco Use  . Smoking status: Former Smoker    Packs/day: 0.10    Years: 2.00    Pack years: 0.20    Types: Cigarettes    Last attempt to quit: 02/06/1984    Years since quitting: 33.0  . Smokeless tobacco: Never Used  Substance and Sexual Activity  . Alcohol use: No  . Drug use: No  . Sexual activity: Not Currently  Other Topics Concern  . Not on  file  Social History Narrative  . Not on file     PHYSICAL EXAM:  VS: BP 138/84 (BP Location: Left Arm, Patient Position: Sitting, Cuff Size: Large)   Pulse 96   Temp 98.7 F (37.1 C) (Oral)   Ht 5\' 2"  (1.575 m)   Wt 294 lb (133.4 kg)   SpO2 96%   BMI 53.77 kg/m  Physical Exam Gen: NAD, alert, cooperative with exam, well-appearing ENT: normal lips, normal nasal mucosa,  Eye: normal EOM, normal conjunctiva and lids CV:  no edema, +2 pedal pulses   Resp: no accessory muscle use, non-labored,  GI: no masses or tenderness, no hernia  Skin: no rashes, no areas of induration  Neuro: normal tone, normal sensation to touch Psych:  normal insight, alert and oriented MSK:  Right and left knee:  TTP of the medial joint line.  No obvious effusion  Normal knee flexion and extension  Some instability with valgus testing  No pain with patellar grind  Pain with standing, unable to perform Thessaly  Neurovascularly intact.  Right shoulder:  Normal active ROM  Normal strength to resistance with IR and ER  Pain with empty can testing.   Limited ultrasound: Right and left knee:  Right knee:  No obvious effusion  Medial joint space narrowing with outpouching of medial meniscus   Left knee:  No obvious effusion  Medial joint space narrowing  Summary: Chronic degenerative changes in both knees.   Ultrasound and interpretation by Clearance Coots, MD   Aspiration/Injection Procedure Note Betty Green [birth year]  Procedure: Injection Indications: left knee pain   Procedure Details Consent: Risks of procedure as well as the alternatives and risks of each were explained to the (patient/caregiver).  Consent for procedure obtained. Time Out: Verified patient identification, verified procedure, site/side was marked, verified correct patient position, special equipment/implants available, medications/allergies/relevent history reviewed, required imaging and test results available.   Performed.  The area was cleaned with iodine and alcohol swabs.    The left knee joint was injected using 1 cc's of 40 mg Depomedrol and 4 cc's of 1% lidocaine with a 22 1 1/2" needle.  Ultrasound was used. Images were obtained in Long views showing the injection.    A sterile dressing was applied.  Patient did tolerate procedure well.      Aspiration/Injection Procedure Note Betty Green March 04, 1945  Procedure: Injection Indications: Right knee pain  Procedure Details Consent: Risks of procedure as well as the alternatives and risks of each were explained to the (patient/caregiver).  Consent for procedure obtained. Time Out: Verified patient identification, verified procedure, site/side was marked, verified correct patient position, special equipment/implants available, medications/allergies/relevent history reviewed, required imaging and test results available.  Performed.  The area was  cleaned with iodine and alcohol swabs.    The Right knee joint was injected using 1 cc's of 40 mg Depomedrol and 4 cc's of 1% lidocaine with a 22 1 1/2" needle.  Ultrasound was used. Images were obtained in Long views showing the injection.    A sterile dressing was applied.  Patient did tolerate procedure well.        ASSESSMENT & PLAN:   Bilateral primary osteoarthritis of knee Pain is likely related to OA. - b/l injections today  - counseled on her weight. Could benefit from knee replacement but will need to drop her BMI before it can be considered  - if no improvement from steroid injections then consider gel injections.   Morbid obesity Counseled on her weight today.  - referral to weight management  - discussed surgery and she doesn't want to consider that at this time.   Chronic right shoulder pain Likely related to impingement.  - counseled on HEP  - provided thera band  - if no improvement consider PT or injection

## 2017-03-05 NOTE — Patient Instructions (Signed)
Please try to ice your knees   Take tylenol 650 mg three times a day is the best evidence based medicine we have for arthritis.   Glucosamine sulfate 750mg  twice a day is a supplement that has been shown to help moderate to severe arthritis.  Vitamin D 2000 IU daily  Fish oil 2 grams daily.   Tumeric 500mg  twice daily.   Capsaicin topically up to four times a day may also help with pain.  Cortisone injections are an option if these interventions do not seem to make a difference or need more relief.   If cortisone injections do not help, there are different types of shots that may help but they take longer to take effect.  We can discuss this at follow up.   It's important that you continue to stay active.  Controlling your weight is important.   Water aerobics and cycling with low resistance are the best two types of exercise for arthritis.

## 2017-03-05 NOTE — Assessment & Plan Note (Signed)
Pain is likely related to OA. - b/l injections today  - counseled on her weight. Could benefit from knee replacement but will need to drop her BMI before it can be considered  - if no improvement from steroid injections then consider gel injections.

## 2017-03-05 NOTE — Assessment & Plan Note (Signed)
Likely related to impingement.  - counseled on HEP  - provided thera band  - if no improvement consider PT or injection

## 2017-03-05 NOTE — Assessment & Plan Note (Signed)
Counseled on her weight today.  - referral to weight management  - discussed surgery and she doesn't want to consider that at this time.

## 2017-03-08 ENCOUNTER — Telehealth: Payer: Self-pay | Admitting: Internal Medicine

## 2017-03-08 NOTE — Telephone Encounter (Signed)
Copied from [place]. Topic: Quick Communication - Rx Refill/Question >> Mar 08, 2017  3:13 PM Oliver Pila B wrote: Medication:  hydrOXYzine (ATARAX/VISTARIL) 25 MG tablet [[id number]]   Pt spoke w/ her insurance company([employer]) and they state the Rx is coming up as a tier 4, pt states they need a generic Rx, fax new Rx to [phone], contact pt to advise  Pt would like as well a refill of Lorcaserin HCl (BELVIQ) 10 MG TABS [[id number]] DISCONTINUED

## 2017-03-09 MED ORDER — LORCASERIN HCL 10 MG PO TABS: 10.0000 mg | ORAL_TABLET | Freq: Two times a day (BID) | ORAL | 5 refills | Status: DC

## 2017-03-09 NOTE — Telephone Encounter (Signed)
I suspect the atarax referred to is already the generic form; so a new rx will not help  Please have the patient let us know if this is not the case, as I would not know this b/c cost of meds (including generic) is determined by her insurance  [place] for [place], but pt should make sure to check the cost on this as well, as I doubt this is covered and we are very unlikely to do anything about this

## 2017-03-14 ENCOUNTER — Telehealth: Payer: Self-pay | Admitting: Pulmonary Disease

## 2017-03-14 DIAGNOSIS — G4733 Obstructive sleep apnea (adult) (pediatric): Secondary | ICD-10-CM

## 2017-03-14 NOTE — Telephone Encounter (Signed)
RA please advise patient is stating that you wanted her to have a new CPAP machine. I don't see anywhere in the chart where a new one was to be ordered. I do see where her pressure was to be increased to 14. Please advise on this. Thanks.

## 2017-03-14 NOTE — Telephone Encounter (Signed)
AutoCPAP 10-15 cm DL & OV in 6 wks with NP

## 2017-03-15 NOTE — Telephone Encounter (Signed)
Order has been placed. Nothing else needed at time of call.

## 2017-03-15 NOTE — Telephone Encounter (Signed)
Pt is returning call. Per Pt she would like to use [facility] again as her DME. Cb is [phone].

## 2017-03-15 NOTE — Telephone Encounter (Signed)
Left message for patient to call back. Need to see if she wants to use [facility] again as her DME.

## 2017-03-21 ENCOUNTER — Encounter: Payer: Self-pay | Admitting: Family Medicine

## 2017-03-21 ENCOUNTER — Ambulatory Visit (INDEPENDENT_AMBULATORY_CARE_PROVIDER_SITE_OTHER): Payer: Medicare HMO | Admitting: Family Medicine

## 2017-03-21 DIAGNOSIS — M170 Bilateral primary osteoarthritis of knee: Secondary | ICD-10-CM | POA: Diagnosis not present

## 2017-03-21 NOTE — Patient Instructions (Signed)
Take tylenol 650 mg three times a day is the best evidence based medicine we have for arthritis.  Glucosamine sulfate 750mg  twice a day is a supplement that has been shown to help moderate to severe arthritis. Vitamin D 2000 IU daily Fish oil 2 grams daily.  Tumeric 500mg  twice daily.  Capsaicin topically up to four times a day may also help with pain. It's important that you continue to stay active. Controlling your weight is important.  Consider physical therapy to strengthen muscles around the joint that hurts to take pressure off of the joint itself. Shoe inserts with good arch support may be helpful.  [employer] orthotics at [employer] could help.  Water aerobics and cycling with low resistance are the best two types of exercise for arthritis.

## 2017-03-21 NOTE — Progress Notes (Signed)
Betty Green - 72 y.o. female MRN [id number]  Date of birth: 06-01-1945  SUBJECTIVE:  Including CC & ROS.  Chief Complaint  Patient presents with  . Knee Pain    Betty Green is a 72 y.o. female that is here today bilateral knee pain follow up. She was seen 03/05/17 received bilateral knee injections. She states the injections improved her pain for one week. The pain has been increasing since then. She has been taking Mobic. Pain is constant.  Review of the left knee x-ray from 2008 shows chondromalacia of the patellofemoral joint.   Review of Systems  Musculoskeletal: Positive for arthralgias.  Skin: Negative for color change.  Hematological: Negative for adenopathy.  Psychiatric/Behavioral: Negative for agitation.    HISTORY: Past Medical, Surgical, Social, and Family History Reviewed & Updated per EMR.   Pertinent Historical Findings include:  Past Medical History:  Diagnosis Date  . Allergy   . Anxiety    "take RX prn"  . Arthritis    "all over"  . Asthma   . Chronic bronchitis ([place])   . GERD (gastroesophageal reflux disease)   . Hypertension   . Insomnia 05/31/2011  . OSA on CPAP    wears CPAP sometimes (06/23/2014)  . Other and unspecified hyperlipidemia 08/04/2012  . PONV (postoperative nausea and vomiting)   . Shortness of breath dyspnea    with exertion  . Shoulder pain, bilateral 10/18/2010  . Vitamin D deficiency 12/21/2015    Past Surgical History:  Procedure Laterality Date  . APPENDECTOMY  1966  . BREAST EXCISIONAL BIOPSY Right   . CHOLECYSTECTOMY OPEN  1981  . FRACTURE SURGERY    . ORIF ANKLE FRACTURE Right 06/23/2014  . ORIF ANKLE FRACTURE Right 06/23/2014   Procedure: Take Down Malunion Right Ankle, Open Reduction Internal Fixation Right Ankle, Possible Syndesmosis Repair, Medial Joint Debridement;  Surgeon: Newt Minion, MD;  Location: [facility];  Service: Orthopedics;  Laterality: Right;  . TONSILLECTOMY  1963  . TOTAL ABDOMINAL HYSTERECTOMY  1980   w/BSO    Allergies  Allergen Reactions  . Amoxicillin-Pot Clavulanate     Hives  . Azithromycin Other (See Comments)    abd pain  . Lipitor [Atorvastatin] Itching    Family History  Problem Relation Age of Onset  . Arthritis Other   . Arthritis Mother   . Heart disease Mother   . Hypertension Mother   . Arthritis Father   . Heart disease Father   . Hypertension Father   . Cancer Other        colon and prostate cancer     Social History   Socioeconomic History  . Marital status: Single    Spouse name: Not on file  . Number of children: 2  . Years of education: 33  . Highest education level: Not on file  Social Needs  . Financial resource strain: Not on file  . Food insecurity - worry: Not on file  . Food insecurity - inability: Not on file  . Transportation needs - medical: Not on file  . Transportation needs - non-medical: Not on file  Occupational History  . Occupation: retired    Fish farm manager: [employer]  Tobacco Use  . Smoking status: Former Smoker    Packs/day: 0.10    Years: 2.00    Pack years: 0.20    Types: Cigarettes    Last attempt to quit: 02/06/1984    Years since quitting: 33.1  . Smokeless tobacco: Never Used  Substance and Sexual Activity  . Alcohol use: No  . Drug use: No  . Sexual activity: Not Currently  Other Topics Concern  . Not on file  Social History Narrative  . Not on file     PHYSICAL EXAM:  VS: BP 134/78 (BP Location: Left Arm, Patient Position: Sitting, Cuff Size: Normal)   Pulse (!) 104   Temp 98.7 F (37.1 C) (Oral)   Ht 5\' 2"  (1.575 m)   Wt 298 lb (135.2 kg)   SpO2 97%   BMI 54.50 kg/m  Physical Exam Gen: NAD, alert, cooperative with exam, well-appearing ENT: normal lips, normal nasal mucosa,  Eye: normal EOM, normal conjunctiva and lids CV:  no edema, +2 pedal pulses   Resp: no accessory muscle use, non-labored,  Skin: no rashes, no areas of induration  Neuro: normal tone, normal sensation to  touch Psych:  normal insight, alert and oriented MSK:  Left and right knee: No significant effusion. Normal knee flexion and extension. Some instability with valgus and varus stress testing. Neurovascularly intact.      ASSESSMENT & PLAN:   Bilateral primary osteoarthritis of knee Too soon for another course of injections. Will pursue Visco supplementation at this time - Counseled on other supportive measures - We will call about Visco supplementation - Can consider getting xrays

## 2017-03-21 NOTE — Assessment & Plan Note (Signed)
Too soon for another course of injections. Will pursue Visco supplementation at this time - Counseled on other supportive measures - We will call about Visco supplementation - Can consider getting xrays

## 2017-03-22 DIAGNOSIS — H30033 Focal chorioretinal inflammation, peripheral, bilateral: Secondary | ICD-10-CM | POA: Diagnosis not present

## 2017-03-22 DIAGNOSIS — H2513 Age-related nuclear cataract, bilateral: Secondary | ICD-10-CM | POA: Diagnosis not present

## 2017-03-22 DIAGNOSIS — H35033 Hypertensive retinopathy, bilateral: Secondary | ICD-10-CM | POA: Diagnosis not present

## 2017-03-22 DIAGNOSIS — H3581 Retinal edema: Secondary | ICD-10-CM | POA: Diagnosis not present

## 2017-03-22 DIAGNOSIS — Z79899 Other long term (current) drug therapy: Secondary | ICD-10-CM | POA: Diagnosis not present

## 2017-03-22 DIAGNOSIS — H35063 Retinal vasculitis, bilateral: Secondary | ICD-10-CM | POA: Diagnosis not present

## 2017-03-25 ENCOUNTER — Encounter: Payer: Self-pay | Admitting: Internal Medicine

## 2017-03-25 ENCOUNTER — Ambulatory Visit (INDEPENDENT_AMBULATORY_CARE_PROVIDER_SITE_OTHER): Payer: Medicare HMO | Admitting: Internal Medicine

## 2017-03-25 VITALS — BP 132/88 | HR 107 | Temp 98.4°F | Ht 62.0 in | Wt 295.0 lb

## 2017-03-25 DIAGNOSIS — M170 Bilateral primary osteoarthritis of knee: Secondary | ICD-10-CM | POA: Diagnosis not present

## 2017-03-25 DIAGNOSIS — J3089 Other allergic rhinitis: Secondary | ICD-10-CM | POA: Diagnosis not present

## 2017-03-25 DIAGNOSIS — J302 Other seasonal allergic rhinitis: Secondary | ICD-10-CM

## 2017-03-25 MED ORDER — BUPROPION HCL ER (XL) 150 MG PO TB24: 150.0000 mg | ORAL_TABLET | Freq: Every day | ORAL | 3 refills | Status: DC

## 2017-03-25 MED ORDER — NALTREXONE HCL 50 MG PO TABS: 50.0000 mg | ORAL_TABLET | Freq: Every day | ORAL | 3 refills | Status: DC

## 2017-03-25 MED ORDER — TRAMADOL HCL 50 MG PO TABS: 50.0000 mg | ORAL_TABLET | Freq: Three times a day (TID) | ORAL | 2 refills | Status: DC | PRN

## 2017-03-25 NOTE — Assessment & Plan Note (Signed)
For tramadol prn, f/ju with ortho as planned

## 2017-03-25 NOTE — Assessment & Plan Note (Signed)
Mild to mod, for zytec and nasacort asd,  to f/u any worsening symptoms or concerns

## 2017-03-25 NOTE — Patient Instructions (Signed)
Please take all new medication as prescribed - the wellbutrin and naltrexone (which make up contrave)  Please take all new medication as prescribed - the OTC Zyrtec and Nasacort as directed  Please continue all other medications as before, and refills have been done if requested - the tramadol  Please have the pharmacy call with any other refills you may need.  Please continue your efforts at being more active, low cholesterol diet, and weight control.  Please keep your appointments with your specialists as you may have planned

## 2017-03-25 NOTE — Progress Notes (Signed)
Subjective:    Patient ID: Betty Green, female    DOB: January 30, 1946, 72 y.o.   MRN: [id number]  HPI  Here to f/u as she is strongly needing for wt loss to become eligible for bilat knee replacements, bilat knee pain now severe constant, worse with standing and walking, and does not even want to stand and up on exam table due to pain.  No giveaways or falls, no recent trauma or hx of gout.  Also does have several wks ongoing nasal allergy symptoms with clearish congestion, itch and sneezing, without fever, pain, ST, cough, swelling or wheezing.  Pt denies chest pain, increased sob or doe, wheezing, orthopnea, PND, increased LE swelling, palpitations, dizziness or syncope.  Pt denies new neurological symptoms such as new headache, or facial or extremity weakness or numbness   Pt denies polydipsia, polyuria  No other new complaints or interval hx Past Medical History:  Diagnosis Date  . Allergy   . Anxiety    "take RX prn"  . Arthritis    "all over"  . Asthma   . Chronic bronchitis ([place])   . GERD (gastroesophageal reflux disease)   . Hypertension   . Insomnia 05/31/2011  . OSA on CPAP    wears CPAP sometimes (06/23/2014)  . Other and unspecified hyperlipidemia 08/04/2012  . PONV (postoperative nausea and vomiting)   . Shortness of breath dyspnea    with exertion  . Shoulder pain, bilateral 10/18/2010  . Vitamin D deficiency 12/21/2015   Past Surgical History:  Procedure Laterality Date  . APPENDECTOMY  1966  . BREAST EXCISIONAL BIOPSY Right   . CHOLECYSTECTOMY OPEN  1981  . FRACTURE SURGERY    . ORIF ANKLE FRACTURE Right 06/23/2014  . ORIF ANKLE FRACTURE Right 06/23/2014   Procedure: Take Down Malunion Right Ankle, Open Reduction Internal Fixation Right Ankle, Possible Syndesmosis Repair, Medial Joint Debridement;  Surgeon: Newt Minion, MD;  Location: [facility];  Service: Orthopedics;  Laterality: Right;  . TONSILLECTOMY  1963  . TOTAL ABDOMINAL HYSTERECTOMY  1980   w/BSO    reports  that she quit smoking about 33 years ago. Her smoking use included cigarettes. She has a 0.20 pack-year smoking history. she has never used smokeless tobacco. She reports that she does not drink alcohol or use drugs. family history includes Arthritis in her father, mother, and other; Cancer in her other; Heart disease in her father and mother; Hypertension in her father and mother. Allergies  Allergen Reactions  . Amoxicillin-Pot Clavulanate     Hives  . Azithromycin Other (See Comments)    abd pain  . Lipitor [Atorvastatin] Itching   Current Outpatient Medications on File Prior to Visit  Medication Sig Dispense Refill  . alendronate (FOSAMAX) 70 MG tablet Take 1 tablet (70 mg total) by mouth every 7 (seven) days. Take with a full glass of water on an empty stomach. 12 tablet 3  . furosemide (LASIX) 80 MG tablet Take 1 tablet (80 mg total) by mouth 2 (two) times daily. 180 tablet 3  . hydrOXYzine (ATARAX/VISTARIL) 25 MG tablet TAKE 1-2 TABS BY MOUTH THREE TIMES PER DAY AS NEEDED FOR ITCHING 180 tablet 1  . losartan (COZAAR) 100 MG tablet Take 1 tablet (100 mg total) by mouth daily. 90 tablet 3  . meloxicam (MOBIC) 15 MG tablet Take 1 tablet (15 mg total) by mouth daily. with food 90 tablet 3  . simvastatin (ZOCOR) 20 MG tablet TAKE 1 TABLET BY MOUTH AT BEDTIME  90 tablet 2   No current facility-administered medications on file prior to visit.    Review of Systems  Constitutional: Negative for other unusual diaphoresis or sweats HENT: Negative for ear discharge or swelling Eyes: Negative for other worsening visual disturbances Respiratory: Negative for stridor or other swelling  Gastrointestinal: Negative for worsening distension or other blood Genitourinary: Negative for retention or other urinary change Musculoskeletal: Negative for other MSK pain or swelling Skin: Negative for color change or other new lesions Neurological: Negative for worsening tremors and other numbness    Psychiatric/Behavioral: Negative for worsening agitation or other fatigue All other system neg per pt    Objective:   Physical Exam BP 132/88   Pulse (!) 107   Temp 98.4 F (36.9 C) (Oral)   Ht 5\' 2"  (1.575 m)   Wt 295 lb (133.8 kg)   SpO2 96%   BMI 53.96 kg/m  VS noted,  Constitutional: Pt appears in NAD HENT: Head: NCAT.  Right Ear: External ear normal.  Left Ear: External ear normal.  Eyes: . Pupils are equal, round, and reactive to light. Conjunctivae and EOM are normal Nose: without d/c or deformity Neck: Neck supple. Gross normal ROM Bilat tm's with mild erythema.  Max sinus areas mild tender.  Pharynx with mild erythema, no exudate Cardiovascular: Normal rate and regular rhythm.   Pulmonary/Chest: Effort normal and breath sounds without rales or wheezing.  Abd:  Soft, NT, ND, + BS, no organomegaly bilat knees with marked bony degenerative changes Neurological: Pt is alert. At baseline orientation, motor grossly intact Skin: Skin is warm. No rashes, other new lesions, no LE edema Psychiatric: Pt behavior is normal without agitation  No other exam findings     Assessment & Plan:

## 2017-03-25 NOTE — Assessment & Plan Note (Signed)
Needs significant wt loss at least 50 lbs to qualify for surgury/knee replacements, for contrave like tx

## 2017-04-04 DIAGNOSIS — G4733 Obstructive sleep apnea (adult) (pediatric): Secondary | ICD-10-CM | POA: Diagnosis not present

## 2017-04-09 ENCOUNTER — Telehealth: Payer: Self-pay | Admitting: Internal Medicine

## 2017-04-09 NOTE — Telephone Encounter (Signed)
Copied from [place] [id number]. Topic: Quick Communication - See Telephone Encounter >> Apr 09, 2017 10:37 AM Betty Green wrote: Pt is waiting on reply to see if the gel knee injection would be covered under her insurance.   Please call pt asap so she can get scheduled to get the injection.

## 2017-04-10 NOTE — Telephone Encounter (Signed)
Left message to return call 

## 2017-04-10 NOTE — Telephone Encounter (Signed)
Spoke with patient, appointment scheduled for gel injections.

## 2017-04-11 ENCOUNTER — Other Ambulatory Visit: Payer: Self-pay | Admitting: Internal Medicine

## 2017-04-12 ENCOUNTER — Ambulatory Visit: Payer: Self-pay

## 2017-04-12 ENCOUNTER — Ambulatory Visit (INDEPENDENT_AMBULATORY_CARE_PROVIDER_SITE_OTHER): Payer: Medicare HMO | Admitting: Family Medicine

## 2017-04-12 ENCOUNTER — Encounter: Payer: Self-pay | Admitting: Family Medicine

## 2017-04-12 VITALS — BP 136/74 | HR 87 | Temp 97.7°F | Ht 62.0 in | Wt 297.0 lb

## 2017-04-12 DIAGNOSIS — M170 Bilateral primary osteoarthritis of knee: Secondary | ICD-10-CM | POA: Diagnosis not present

## 2017-04-12 NOTE — Progress Notes (Signed)
Betty Green - 72 y.o. female MRN [id number]  Date of birth: 02/17/1945  SUBJECTIVE:  Including CC & ROS.  Chief Complaint  Patient presents with  . Orthovisc injection    Betty Green is a 72 y.o. female that is here today for bilateral knee pain. Knee pain has not improved. No changes in her history or recent injuries. Has tried steroid injections on 2/14 without improvement. Pain is localized to the knee on the medial aspect. No locking of the knee. Pain is moderate to severe.   Review of the left knee xray from 2008 shows chondromalacia of the patellofemoral joint.     Review of Systems  Constitutional: Negative for fever.  HENT: Negative for congestion.   Respiratory: Negative for cough.   Cardiovascular: Negative for chest pain.  Gastrointestinal: Negative for abdominal pain.  Musculoskeletal: Positive for arthralgias.  Skin: Negative for color change.  Allergic/Immunologic: Negative for immunocompromised state.  Neurological: Negative for weakness.  Hematological: Negative for adenopathy.  Psychiatric/Behavioral: Negative for agitation.    HISTORY: Past Medical, Surgical, Social, and Family History Reviewed & Updated per EMR.   Pertinent Historical Findings include:  Past Medical History:  Diagnosis Date  . Allergy   . Anxiety    "take RX prn"  . Arthritis    "all over"  . Asthma   . Chronic bronchitis ([place])   . GERD (gastroesophageal reflux disease)   . Hypertension   . Insomnia 05/31/2011  . OSA on CPAP    wears CPAP sometimes (06/23/2014)  . Other and unspecified hyperlipidemia 08/04/2012  . PONV (postoperative nausea and vomiting)   . Shortness of breath dyspnea    with exertion  . Shoulder pain, bilateral 10/18/2010  . Vitamin D deficiency 12/21/2015    Past Surgical History:  Procedure Laterality Date  . APPENDECTOMY  1966  . BREAST EXCISIONAL BIOPSY Right   . CHOLECYSTECTOMY OPEN  1981  . FRACTURE SURGERY    . ORIF ANKLE FRACTURE Right 06/23/2014    . ORIF ANKLE FRACTURE Right 06/23/2014   Procedure: Take Down Malunion Right Ankle, Open Reduction Internal Fixation Right Ankle, Possible Syndesmosis Repair, Medial Joint Debridement;  Surgeon: Newt Minion, MD;  Location: [facility];  Service: Orthopedics;  Laterality: Right;  . TONSILLECTOMY  1963  . TOTAL ABDOMINAL HYSTERECTOMY  1980   w/BSO    Allergies  Allergen Reactions  . Amoxicillin-Pot Clavulanate     Hives  . Azithromycin Other (See Comments)    abd pain  . Lipitor [Atorvastatin] Itching    Family History  Problem Relation Age of Onset  . Arthritis Other   . Arthritis Mother   . Heart disease Mother   . Hypertension Mother   . Arthritis Father   . Heart disease Father   . Hypertension Father   . Cancer Other        colon and prostate cancer     Social History   Socioeconomic History  . Marital status: Single    Spouse name: Not on file  . Number of children: 2  . Years of education: 71  . Highest education level: Not on file  Social Needs  . Financial resource strain: Not on file  . Food insecurity - worry: Not on file  . Food insecurity - inability: Not on file  . Transportation needs - medical: Not on file  . Transportation needs - non-medical: Not on file  Occupational History  . Occupation: retired    Fish farm manager: [employer]  Tobacco Use  . Smoking status: Former Smoker    Packs/day: 0.10    Years: 2.00    Pack years: 0.20    Types: Cigarettes    Last attempt to quit: 02/06/1984    Years since quitting: 33.2  . Smokeless tobacco: Never Used  Substance and Sexual Activity  . Alcohol use: No  . Drug use: No  . Sexual activity: Not Currently  Other Topics Concern  . Not on file  Social History Narrative  . Not on file     PHYSICAL EXAM:  VS: BP 136/74 (BP Location: Left Wrist, Patient Position: Sitting, Cuff Size: Normal)   Pulse 87   Temp 97.7 F (36.5 C) (Oral)   Ht 5\' 2"  (1.575 m)   Wt 297 lb (134.7 kg)   SpO2 97%   BMI  54.32 kg/m  Physical Exam Gen: NAD, alert, cooperative with exam, well-appearing ENT: normal lips, normal nasal mucosa,  Eye: normal EOM, normal conjunctiva and lids CV:  no edema, +2 pedal pulses   Resp: no accessory muscle use, non-labored,  Skin: no rashes, no areas of induration  Neuro: normal tone, normal sensation to touch Psych:  normal insight, alert and oriented MSK:  Right and left knee:  No obvious effusion  Normal flexion and extension  Normal gait  TTP of the medial joint line.  Neurovascularly intact.     Aspiration/Injection Procedure Note Betty Green 20-Sep-1945  Procedure: Injection Indications: Right knee pain   Procedure Details Consent: Risks of procedure as well as the alternatives and risks of each were explained to the (patient/caregiver).  Consent for procedure obtained. Time Out: Verified patient identification, verified procedure, site/side was marked, verified correct patient position, special equipment/implants available, medications/allergies/relevent history reviewed, required imaging and test results available.  Performed.  The area was cleaned with iodine and alcohol swabs.    The right knee joint was injected using 3 cc's of 0.25% bupivacaine with a 22 1 1/2" needle in the superiorlateral aspect to anesthetize the tract and the skin. Orthovisc was injected with ultrasound. Images were obtained in Long views showing the injection.    A sterile dressing was applied.  Patient did tolerate procedure well.   Aspiration/Injection Procedure Note Betty Green Jun 06, 1945  Procedure: Injection Indications: left knee pain   Procedure Details Consent: Risks of procedure as well as the alternatives and risks of each were explained to the (patient/caregiver).  Consent for procedure obtained. Time Out: Verified patient identification, verified procedure, site/side was marked, verified correct patient position, special equipment/implants available,  medications/allergies/relevent history reviewed, required imaging and test results available.  Performed.  The area was cleaned with iodine and alcohol swabs.    The left knee joint was injected using 3 cc's of 0.25% bupivacaine with a 22 1 1/2" needle in the superiorlateral aspect to anesthetize the skin on the tract. Orthovisc was then injected. Ultrasound was used and images were obtained in Long views showing the injection.    A sterile dressing was applied.  Patient did tolerate procedure well.    ASSESSMENT & PLAN:   Bilateral primary osteoarthritis of knee Started the series of orthovisc today. Failed steroid injections - f/u in one week for next series.  - Counseled on reactions to injection

## 2017-04-12 NOTE — Assessment & Plan Note (Signed)
Started the series of orthovisc today. Failed steroid injections - f/u in one week for next series.  - Counseled on reactions to injection

## 2017-04-12 NOTE — Patient Instructions (Signed)
Please follow up with me in one week.  Please ice your knees.

## 2017-04-16 ENCOUNTER — Telehealth: Payer: Self-pay | Admitting: Internal Medicine

## 2017-04-16 NOTE — Telephone Encounter (Signed)
Copied from [place]. Topic: Quick Communication - Rx Refill/Question >> Apr 16, 2017  3:54 PM Scherrie Gerlach wrote: Medication: prolia rep Tomi Bamberger with [employer] calling to let you know she will be faxing the prior auth approval to you. Any questions you can call her back Tomi Bamberger  [phone] Ext [phone]

## 2017-04-19 ENCOUNTER — Encounter: Payer: Self-pay | Admitting: Family Medicine

## 2017-04-19 ENCOUNTER — Ambulatory Visit (INDEPENDENT_AMBULATORY_CARE_PROVIDER_SITE_OTHER): Payer: Medicare HMO

## 2017-04-19 ENCOUNTER — Ambulatory Visit (INDEPENDENT_AMBULATORY_CARE_PROVIDER_SITE_OTHER): Payer: Medicare HMO | Admitting: Family Medicine

## 2017-04-19 ENCOUNTER — Ambulatory Visit: Payer: Medicare HMO

## 2017-04-19 VITALS — BP 134/72 | HR 95 | Temp 98.4°F | Ht 62.0 in | Wt 297.0 lb

## 2017-04-19 DIAGNOSIS — M170 Bilateral primary osteoarthritis of knee: Secondary | ICD-10-CM | POA: Diagnosis not present

## 2017-04-19 DIAGNOSIS — M25561 Pain in right knee: Secondary | ICD-10-CM | POA: Diagnosis not present

## 2017-04-19 DIAGNOSIS — M1712 Unilateral primary osteoarthritis, left knee: Secondary | ICD-10-CM | POA: Diagnosis not present

## 2017-04-19 NOTE — Progress Notes (Signed)
Betty Green - 72 y.o. female MRN [id number]  Date of birth: 1945/08/20  SUBJECTIVE:  Including CC & ROS.  Chief Complaint  Patient presents with  . Orthovisc injection    Betty Green is a 72 y.o. female that Is here today for bilateral Orthovisc injections. This is her second injection in the series.      Review of Systems  Constitutional: Negative for fever.  Musculoskeletal: Positive for arthralgias.  Skin: Negative for color change.    HISTORY: Past Medical, Surgical, Social, and Family History Reviewed & Updated per EMR.   Pertinent Historical Findings include:  Past Medical History:  Diagnosis Date  . Allergy   . Anxiety    "take RX prn"  . Arthritis    "all over"  . Asthma   . Chronic bronchitis ([place])   . GERD (gastroesophageal reflux disease)   . Hypertension   . Insomnia 05/31/2011  . OSA on CPAP    wears CPAP sometimes (06/23/2014)  . Other and unspecified hyperlipidemia 08/04/2012  . PONV (postoperative nausea and vomiting)   . Shortness of breath dyspnea    with exertion  . Shoulder pain, bilateral 10/18/2010  . Vitamin D deficiency 12/21/2015    Past Surgical History:  Procedure Laterality Date  . APPENDECTOMY  1966  . BREAST EXCISIONAL BIOPSY Right   . CHOLECYSTECTOMY OPEN  1981  . FRACTURE SURGERY    . ORIF ANKLE FRACTURE Right 06/23/2014  . ORIF ANKLE FRACTURE Right 06/23/2014   Procedure: Take Down Malunion Right Ankle, Open Reduction Internal Fixation Right Ankle, Possible Syndesmosis Repair, Medial Joint Debridement;  Surgeon: Newt Minion, MD;  Location: [facility];  Service: Orthopedics;  Laterality: Right;  . TONSILLECTOMY  1963  . TOTAL ABDOMINAL HYSTERECTOMY  1980   w/BSO    Allergies  Allergen Reactions  . Amoxicillin-Pot Clavulanate     Hives  . Azithromycin Other (See Comments)    abd pain  . Lipitor [Atorvastatin] Itching    Family History  Problem Relation Age of Onset  . Arthritis Other   . Arthritis Mother   . Heart  disease Mother   . Hypertension Mother   . Arthritis Father   . Heart disease Father   . Hypertension Father   . Cancer Other        colon and prostate cancer     Social History   Socioeconomic History  . Marital status: Single    Spouse name: Not on file  . Number of children: 2  . Years of education: 10  . Highest education level: Not on file  Social Needs  . Financial resource strain: Not on file  . Food insecurity - worry: Not on file  . Food insecurity - inability: Not on file  . Transportation needs - medical: Not on file  . Transportation needs - non-medical: Not on file  Occupational History  . Occupation: retired    Fish farm manager: [employer]  Tobacco Use  . Smoking status: Former Smoker    Packs/day: 0.10    Years: 2.00    Pack years: 0.20    Types: Cigarettes    Last attempt to quit: 02/06/1984    Years since quitting: 33.2  . Smokeless tobacco: Never Used  Substance and Sexual Activity  . Alcohol use: No  . Drug use: No  . Sexual activity: Not Currently  Other Topics Concern  . Not on file  Social History Narrative  . Not on file  PHYSICAL EXAM:  VS: BP 134/72 (BP Location: Left Arm, Patient Position: Sitting, Cuff Size: Normal)   Pulse 95   Temp 98.4 F (36.9 C) (Oral)   Ht 5\' 2"  (1.575 m)   Wt 297 lb (134.7 kg)   SpO2 93%   BMI 54.32 kg/m  Physical Exam   Aspiration/Injection Procedure Note Betty OCEGUERA [birth year]  Procedure: Injection Indications: Right knee pain   Procedure Details Consent: Risks of procedure as well as the alternatives and risks of each were explained to the (patient/caregiver).  Consent for procedure obtained. Time Out: Verified patient identification, verified procedure, site/side was marked, verified correct patient position, special equipment/implants available, medications/allergies/relevent history reviewed, required imaging and test results available.  Performed.  The area was cleaned with iodine and  alcohol swabs.    The left knee joint was injected using 3 cc's 1% lidocaine with a 22 1 1/2" needle to anesthetize the tract and skin. Orthovisc was then injected. Ultrasound was used. Images were obtained in Long views showing the injection.    A sterile dressing was applied.  Patient did tolerate procedure well.   Aspiration/Injection Procedure Note Betty Green 15-Aug-1945  Procedure: Injection Indications: left knee pain   Procedure Details Consent: Risks of procedure as well as the alternatives and risks of each were explained to the (patient/caregiver).  Consent for procedure obtained. Time Out: Verified patient identification, verified procedure, site/side was marked, verified correct patient position, special equipment/implants available, medications/allergies/relevent history reviewed, required imaging and test results available.  Performed.  The area was cleaned with iodine and alcohol swabs.    The left knee joint was injected using  3 cc's of 1% lidocaine with a 22 1 1/2" needle to anesthetize the tract and skin. Orthovisc was then injected.  Ultrasound was used. Images were obtained in Long views showing the injection.    A sterile dressing was applied.  Patient did tolerate procedure well.    ASSESSMENT & PLAN:   Bilateral primary osteoarthritis of knee Second injection of orthovisc today.  - b/l knee xrays today. Could consider medial brace unloader.

## 2017-04-19 NOTE — Assessment & Plan Note (Signed)
Second injection of orthovisc today.  - b/l knee xrays today. Could consider medial brace unloader.

## 2017-04-19 NOTE — Patient Instructions (Signed)
Please follow up in one week for your last injection  Please ice the knee

## 2017-04-22 ENCOUNTER — Telehealth: Payer: Self-pay | Admitting: Family Medicine

## 2017-04-22 NOTE — Telephone Encounter (Signed)
Left VM for patient. If she calls back please have her speak with a nurse/CMA and inform that her xrays show that the inside of both of her knees has worn out. On the xray, the right is worse than the left. We could try a custom brace from [employer] if she would like to try that.   If any questions then please take the best time and phone number to call and I will try to call her back.   Rosemarie Ax, MD [facility] 04/22/2017, 12:47 PM

## 2017-04-24 ENCOUNTER — Telehealth: Payer: Self-pay | Admitting: Emergency Medicine

## 2017-04-24 NOTE — Telephone Encounter (Signed)
Spoke with patient regarding results from Dr. Raeford Razor regarding patient knees and recommending patient order a custom brace from [facility]. Patient was given phone number and will give that office a call. Nothing further needed.

## 2017-04-26 ENCOUNTER — Ambulatory Visit: Payer: Medicare HMO | Admitting: Family Medicine

## 2017-04-26 ENCOUNTER — Ambulatory Visit (INDEPENDENT_AMBULATORY_CARE_PROVIDER_SITE_OTHER): Payer: Medicare HMO | Admitting: Family Medicine

## 2017-04-26 ENCOUNTER — Ambulatory Visit: Payer: Self-pay

## 2017-04-26 VITALS — BP 134/78 | HR 86 | Temp 98.2°F | Ht 62.0 in

## 2017-04-26 DIAGNOSIS — M170 Bilateral primary osteoarthritis of knee: Secondary | ICD-10-CM

## 2017-04-26 DIAGNOSIS — M545 Low back pain, unspecified: Secondary | ICD-10-CM

## 2017-04-26 DIAGNOSIS — G4733 Obstructive sleep apnea (adult) (pediatric): Secondary | ICD-10-CM | POA: Diagnosis not present

## 2017-04-26 MED ORDER — NAPROXEN 500 MG PO TABS: 500.0000 mg | ORAL_TABLET | Freq: Two times a day (BID) | ORAL | 1 refills | Status: DC

## 2017-04-26 NOTE — Assessment & Plan Note (Addendum)
Finished orthovisc series  - she can f/u in 4 weeks  - medial unloader custom brace is needed due to her thigh to calf ratio

## 2017-04-26 NOTE — Patient Instructions (Signed)
Please follow up with me if your back pain keeps going  Please follow up with me in 4 weeks if you want to. We can make sure your knees are doing ok.

## 2017-04-26 NOTE — Assessment & Plan Note (Signed)
This appears to be related to spasm.  - naproxen  - if no improvement then f/u and consider Pt, imaging, muscle relaxer.

## 2017-04-26 NOTE — Progress Notes (Addendum)
Betty Green - 72 y.o. female MRN [id number]  Date of birth: 1945/07/16  SUBJECTIVE:  Including CC & ROS.  Chief Complaint  Patient presents with  . Orthovisc injection    Betty Green is a 72 y.o. female that is presenting with her 3rd Orthovisc injection.   Low back pain: has been acute on chronic in nature. Occurring at the base of her spine. No radiation down her legs. Pain has been present for one week. No injury. Has been staying the same. Pain is moderate in nature.    Review of Systems  Constitutional: Negative for fever.  HENT: Negative for congestion.   Respiratory: Negative for cough.   Cardiovascular: Negative for chest pain.  Gastrointestinal: Negative for abdominal pain.  Musculoskeletal: Positive for arthralgias and back pain.  Skin: Negative for color change.  Allergic/Immunologic: Negative for immunocompromised state.  Neurological: Negative for weakness.  Hematological: Negative for adenopathy.  Psychiatric/Behavioral: Negative for agitation.    HISTORY: Past Medical, Surgical, Social, and Family History Reviewed & Updated per EMR.   Pertinent Historical Findings include:  Past Medical History:  Diagnosis Date  . Allergy   . Anxiety    "take RX prn"  . Arthritis    "all over"  . Asthma   . Chronic bronchitis ([place])   . GERD (gastroesophageal reflux disease)   . Hypertension   . Insomnia 05/31/2011  . OSA on CPAP    wears CPAP sometimes (06/23/2014)  . Other and unspecified hyperlipidemia 08/04/2012  . PONV (postoperative nausea and vomiting)   . Shortness of breath dyspnea    with exertion  . Shoulder pain, bilateral 10/18/2010  . Vitamin D deficiency 12/21/2015    Past Surgical History:  Procedure Laterality Date  . APPENDECTOMY  1966  . BREAST EXCISIONAL BIOPSY Right   . CHOLECYSTECTOMY OPEN  1981  . FRACTURE SURGERY    . ORIF ANKLE FRACTURE Right 06/23/2014  . ORIF ANKLE FRACTURE Right 06/23/2014   Procedure: Take Down Malunion Right Ankle,  Open Reduction Internal Fixation Right Ankle, Possible Syndesmosis Repair, Medial Joint Debridement;  Surgeon: Newt Minion, MD;  Location: [facility];  Service: Orthopedics;  Laterality: Right;  . TONSILLECTOMY  1963  . TOTAL ABDOMINAL HYSTERECTOMY  1980   w/BSO    Allergies  Allergen Reactions  . Amoxicillin-Pot Clavulanate     Hives  . Azithromycin Other (See Comments)    abd pain  . Lipitor [Atorvastatin] Itching    Family History  Problem Relation Age of Onset  . Arthritis Other   . Arthritis Mother   . Heart disease Mother   . Hypertension Mother   . Arthritis Father   . Heart disease Father   . Hypertension Father   . Cancer Other        colon and prostate cancer     Social History   Socioeconomic History  . Marital status: Single    Spouse name: Not on file  . Number of children: 2  . Years of education: 87  . Highest education level: Not on file  Occupational History  . Occupation: retired    Fish farm manager: [employer]  . Financial resource strain: Not on file  . Food insecurity:    Worry: Not on file    Inability: Not on file  . Transportation needs:    Medical: Not on file    Non-medical: Not on file  Tobacco Use  . Smoking status: Former Smoker    Packs/day:  0.10    Years: 2.00    Pack years: 0.20    Types: Cigarettes    Last attempt to quit: 02/06/1984    Years since quitting: 33.3  . Smokeless tobacco: Never Used  Substance and Sexual Activity  . Alcohol use: No  . Drug use: No  . Sexual activity: Not Currently  Lifestyle  . Physical activity:    Days per week: Not on file    Minutes per session: Not on file  . Stress: Not on file  Relationships  . Social connections:    Talks on phone: Not on file    Gets together: Not on file    Attends religious service: Not on file    Active member of club or organization: Not on file    Attends meetings of clubs or organizations: Not on file    Relationship status: Not on file    . Intimate partner violence:    Fear of current or ex partner: Not on file    Emotionally abused: Not on file    Physically abused: Not on file    Forced sexual activity: Not on file  Other Topics Concern  . Not on file  Social History Narrative  . Not on file     PHYSICAL EXAM:  VS: BP 134/78 (BP Location: Left Arm, Patient Position: Sitting, Cuff Size: Normal)   Pulse 86   Temp 98.2 F (36.8 C) (Oral)   Ht 5\' 2"  (1.575 m)   SpO2 99%   BMI 54.32 kg/m  Physical Exam Gen: NAD, alert, cooperative with exam, well-appearing ENT: normal lips, normal nasal mucosa,  Eye: normal EOM, normal conjunctiva and lids CV:  no edema, +2 pedal pulses   Resp: no accessory muscle use, non-labored,   Skin: no rashes, no areas of induration  Neuro: normal tone, normal sensation to touch Psych:  normal insight, alert and oriented MSK:  Right knee:  No obvious effusion  Normal flexion and extension  Instability with valgus and varus testing  Negative McMurray's test  Back:  Normal gait Normal IR and ER  Normal hip flexion strength to resistance  Negative SLR b/l  Neurovascularly intact    Aspiration/Injection Procedure Note Betty Green May 19, 1945  Procedure: Injection Indications: left knee pain   Procedure Details Consent: Risks of procedure as well as the alternatives and risks of each were explained to the (patient/caregiver).  Consent for procedure obtained. Time Out: Verified patient identification, verified procedure, site/side was marked, verified correct patient position, special equipment/implants available, medications/allergies/relevent history reviewed, required imaging and test results available.  Performed.  The area was cleaned with iodine and alcohol swabs.    The left knee SPP superiorlateral aspect was injected using 5 cc's of 1% lidocaine with a 22 1 1/2" needle. The syringe was switched off and orthovisc was injected.  Ultrasound was used. Images were obtained  in Long views showing the injection.    A sterile dressing was applied.  Patient did tolerate procedure well.   Aspiration/Injection Procedure Note Betty Green 04/18/1945  Procedure: Injection Indications: right knee pain  Procedure Details Consent: Risks of procedure as well as the alternatives and risks of each were explained to the (patient/caregiver).  Consent for procedure obtained. Time Out: Verified patient identification, verified procedure, site/side was marked, verified correct patient position, special equipment/implants available, medications/allergies/relevent history reviewed, required imaging and test results available.  Performed.  The area was cleaned with iodine and alcohol swabs.    The right  knee SPP superiorlateral aspect was injected using 5 cc's of 1% lidocaine with a 22 1 1/2" needle. The syringe was switched off and orthovisc was injected.  Ultrasound was used. Images were obtained in Long views showing the injection.    A sterile dressing was applied.  Patient did tolerate procedure well.        ASSESSMENT & PLAN:   Bilateral primary osteoarthritis of knee Finished orthovisc series  - she can f/u in 4 weeks  - medial unloader custom brace is needed due to her thigh to calf ratio   Acute bilateral low back pain without sciatica This appears to be related to spasm.  - naproxen  - if no improvement then f/u and consider Pt, imaging, muscle relaxer.

## 2017-05-02 ENCOUNTER — Telehealth: Payer: Self-pay | Admitting: Family Medicine

## 2017-05-02 DIAGNOSIS — G4733 Obstructive sleep apnea (adult) (pediatric): Secondary | ICD-10-CM | POA: Diagnosis not present

## 2017-05-02 NOTE — Telephone Encounter (Signed)
States has faxed several requests over for a brace for patient.  States is re faxing request now.

## 2017-05-03 NOTE — Telephone Encounter (Signed)
Spoke with patient, notified someone would be contacting her to get fitted for a brace. Verbalized understanding.

## 2017-05-07 ENCOUNTER — Telehealth: Payer: Self-pay | Admitting: Internal Medicine

## 2017-05-07 NOTE — Telephone Encounter (Unsigned)
Copied from [place] [id number]. Topic: Quick Communication - See Telephone Encounter >> May 07, 2017  9:50 AM Hewitt Shorts wrote: CRM for notification. See Telephone encounter for: 05/07/17.danielle is calling to see if  the form for pt about a brace   Best number to call [phone] ext [phone] danielle

## 2017-05-16 ENCOUNTER — Telehealth: Payer: Self-pay | Admitting: Internal Medicine

## 2017-05-16 NOTE — Telephone Encounter (Signed)
Called patient to inform about Prolia injection. Patient is responsible for a $240 copay at the time of the injection and is due any time.  Please schedule on the nurse schedule with visit note - Prolia - $240 copay (okay to give per [facility]).

## 2017-05-29 ENCOUNTER — Other Ambulatory Visit: Payer: Self-pay | Admitting: Internal Medicine

## 2017-05-29 NOTE — Telephone Encounter (Signed)
At first glance, my impression would be that this is not usually a refillable medication  Please ask pt to consider ROV if she feels she needs an antibiotic (office policy)

## 2017-06-02 DIAGNOSIS — G4733 Obstructive sleep apnea (adult) (pediatric): Secondary | ICD-10-CM | POA: Diagnosis not present

## 2017-06-17 ENCOUNTER — Ambulatory Visit: Payer: Medicare HMO | Admitting: Family Medicine

## 2017-06-25 DIAGNOSIS — J3081 Allergic rhinitis due to animal (cat) (dog) hair and dander: Secondary | ICD-10-CM | POA: Diagnosis not present

## 2017-06-25 DIAGNOSIS — L298 Other pruritus: Secondary | ICD-10-CM | POA: Diagnosis not present

## 2017-06-25 DIAGNOSIS — J301 Allergic rhinitis due to pollen: Secondary | ICD-10-CM | POA: Diagnosis not present

## 2017-06-25 DIAGNOSIS — J3089 Other allergic rhinitis: Secondary | ICD-10-CM | POA: Diagnosis not present

## 2017-07-05 NOTE — Telephone Encounter (Signed)
LM regarding Prolia injection. See message below.  °

## 2017-07-08 DIAGNOSIS — J3089 Other allergic rhinitis: Secondary | ICD-10-CM | POA: Diagnosis not present

## 2017-07-08 DIAGNOSIS — J301 Allergic rhinitis due to pollen: Secondary | ICD-10-CM | POA: Diagnosis not present

## 2017-07-08 DIAGNOSIS — J3081 Allergic rhinitis due to animal (cat) (dog) hair and dander: Secondary | ICD-10-CM | POA: Diagnosis not present

## 2017-07-09 DIAGNOSIS — M1711 Unilateral primary osteoarthritis, right knee: Secondary | ICD-10-CM | POA: Diagnosis not present

## 2017-07-16 DIAGNOSIS — H2513 Age-related nuclear cataract, bilateral: Secondary | ICD-10-CM | POA: Diagnosis not present

## 2017-07-16 DIAGNOSIS — H3581 Retinal edema: Secondary | ICD-10-CM | POA: Diagnosis not present

## 2017-07-16 DIAGNOSIS — H35033 Hypertensive retinopathy, bilateral: Secondary | ICD-10-CM | POA: Diagnosis not present

## 2017-07-16 DIAGNOSIS — H16002 Unspecified corneal ulcer, left eye: Secondary | ICD-10-CM | POA: Insufficient documentation

## 2017-07-16 DIAGNOSIS — H35371 Puckering of macula, right eye: Secondary | ICD-10-CM | POA: Diagnosis not present

## 2017-07-16 DIAGNOSIS — H35063 Retinal vasculitis, bilateral: Secondary | ICD-10-CM | POA: Diagnosis not present

## 2017-07-16 DIAGNOSIS — H30033 Focal chorioretinal inflammation, peripheral, bilateral: Secondary | ICD-10-CM | POA: Diagnosis not present

## 2017-07-16 DIAGNOSIS — Z79899 Other long term (current) drug therapy: Secondary | ICD-10-CM | POA: Diagnosis not present

## 2017-07-17 DIAGNOSIS — H30033 Focal chorioretinal inflammation, peripheral, bilateral: Secondary | ICD-10-CM | POA: Diagnosis not present

## 2017-07-17 DIAGNOSIS — Z79899 Other long term (current) drug therapy: Secondary | ICD-10-CM | POA: Diagnosis not present

## 2017-07-17 DIAGNOSIS — H16002 Unspecified corneal ulcer, left eye: Secondary | ICD-10-CM | POA: Diagnosis not present

## 2017-07-23 DIAGNOSIS — H2513 Age-related nuclear cataract, bilateral: Secondary | ICD-10-CM | POA: Diagnosis not present

## 2017-07-23 DIAGNOSIS — H35063 Retinal vasculitis, bilateral: Secondary | ICD-10-CM | POA: Diagnosis not present

## 2017-07-23 DIAGNOSIS — H35033 Hypertensive retinopathy, bilateral: Secondary | ICD-10-CM | POA: Diagnosis not present

## 2017-07-23 DIAGNOSIS — H3581 Retinal edema: Secondary | ICD-10-CM | POA: Diagnosis not present

## 2017-07-23 DIAGNOSIS — H16002 Unspecified corneal ulcer, left eye: Secondary | ICD-10-CM | POA: Diagnosis not present

## 2017-07-23 DIAGNOSIS — Z79899 Other long term (current) drug therapy: Secondary | ICD-10-CM | POA: Diagnosis not present

## 2017-07-23 DIAGNOSIS — H30033 Focal chorioretinal inflammation, peripheral, bilateral: Secondary | ICD-10-CM | POA: Diagnosis not present

## 2017-07-26 DIAGNOSIS — J3089 Other allergic rhinitis: Secondary | ICD-10-CM | POA: Diagnosis not present

## 2017-07-26 DIAGNOSIS — J3081 Allergic rhinitis due to animal (cat) (dog) hair and dander: Secondary | ICD-10-CM | POA: Diagnosis not present

## 2017-07-26 DIAGNOSIS — J301 Allergic rhinitis due to pollen: Secondary | ICD-10-CM | POA: Diagnosis not present

## 2017-07-29 DIAGNOSIS — J3081 Allergic rhinitis due to animal (cat) (dog) hair and dander: Secondary | ICD-10-CM | POA: Diagnosis not present

## 2017-07-29 DIAGNOSIS — J3089 Other allergic rhinitis: Secondary | ICD-10-CM | POA: Diagnosis not present

## 2017-07-29 DIAGNOSIS — J301 Allergic rhinitis due to pollen: Secondary | ICD-10-CM | POA: Diagnosis not present

## 2017-07-31 DIAGNOSIS — Z1212 Encounter for screening for malignant neoplasm of rectum: Secondary | ICD-10-CM | POA: Diagnosis not present

## 2017-07-31 DIAGNOSIS — J3089 Other allergic rhinitis: Secondary | ICD-10-CM | POA: Diagnosis not present

## 2017-07-31 DIAGNOSIS — J3081 Allergic rhinitis due to animal (cat) (dog) hair and dander: Secondary | ICD-10-CM | POA: Diagnosis not present

## 2017-07-31 DIAGNOSIS — J301 Allergic rhinitis due to pollen: Secondary | ICD-10-CM | POA: Diagnosis not present

## 2017-07-31 DIAGNOSIS — Z1211 Encounter for screening for malignant neoplasm of colon: Secondary | ICD-10-CM | POA: Diagnosis not present

## 2017-08-01 LAB — COLOGUARD: Cologuard: NEGATIVE

## 2017-08-05 DIAGNOSIS — J3089 Other allergic rhinitis: Secondary | ICD-10-CM | POA: Diagnosis not present

## 2017-08-05 DIAGNOSIS — J301 Allergic rhinitis due to pollen: Secondary | ICD-10-CM | POA: Diagnosis not present

## 2017-08-05 DIAGNOSIS — J3081 Allergic rhinitis due to animal (cat) (dog) hair and dander: Secondary | ICD-10-CM | POA: Diagnosis not present

## 2017-08-07 ENCOUNTER — Telehealth: Payer: Self-pay | Admitting: Internal Medicine

## 2017-08-07 DIAGNOSIS — J301 Allergic rhinitis due to pollen: Secondary | ICD-10-CM | POA: Diagnosis not present

## 2017-08-07 DIAGNOSIS — J3081 Allergic rhinitis due to animal (cat) (dog) hair and dander: Secondary | ICD-10-CM | POA: Diagnosis not present

## 2017-08-07 DIAGNOSIS — J3089 Other allergic rhinitis: Secondary | ICD-10-CM | POA: Diagnosis not present

## 2017-08-07 NOTE — Telephone Encounter (Signed)
Copied from [place] [id number]. Topic: Quick Communication - See Telephone Encounter >> Aug 07, 2017  2:27 PM Conception Chancy, NT wrote: CRM for notification. See Telephone encounter for: 08/07/17.  Sharyn Lull is calling from [facility] and is requesting a new order for cologuard states the patient turned it in once it had expired.   Cb# [phone]

## 2017-08-09 NOTE — Telephone Encounter (Signed)
LM regarding Prolia injection. See message below.

## 2017-08-09 NOTE — Telephone Encounter (Signed)
Done  Order# [id number]

## 2017-08-12 DIAGNOSIS — J3089 Other allergic rhinitis: Secondary | ICD-10-CM | POA: Diagnosis not present

## 2017-08-12 DIAGNOSIS — J3081 Allergic rhinitis due to animal (cat) (dog) hair and dander: Secondary | ICD-10-CM | POA: Diagnosis not present

## 2017-08-12 DIAGNOSIS — J301 Allergic rhinitis due to pollen: Secondary | ICD-10-CM | POA: Diagnosis not present

## 2017-08-14 DIAGNOSIS — J3081 Allergic rhinitis due to animal (cat) (dog) hair and dander: Secondary | ICD-10-CM | POA: Diagnosis not present

## 2017-08-14 DIAGNOSIS — J3089 Other allergic rhinitis: Secondary | ICD-10-CM | POA: Diagnosis not present

## 2017-08-14 DIAGNOSIS — J301 Allergic rhinitis due to pollen: Secondary | ICD-10-CM | POA: Diagnosis not present

## 2017-08-15 DIAGNOSIS — G8929 Other chronic pain: Secondary | ICD-10-CM | POA: Diagnosis not present

## 2017-08-15 DIAGNOSIS — M170 Bilateral primary osteoarthritis of knee: Secondary | ICD-10-CM | POA: Diagnosis not present

## 2017-08-19 DIAGNOSIS — J3081 Allergic rhinitis due to animal (cat) (dog) hair and dander: Secondary | ICD-10-CM | POA: Diagnosis not present

## 2017-08-19 DIAGNOSIS — J3089 Other allergic rhinitis: Secondary | ICD-10-CM | POA: Diagnosis not present

## 2017-08-19 DIAGNOSIS — J301 Allergic rhinitis due to pollen: Secondary | ICD-10-CM | POA: Diagnosis not present

## 2017-08-20 ENCOUNTER — Other Ambulatory Visit: Payer: Self-pay | Admitting: Internal Medicine

## 2017-08-20 DIAGNOSIS — H3581 Retinal edema: Secondary | ICD-10-CM | POA: Diagnosis not present

## 2017-08-20 DIAGNOSIS — H2513 Age-related nuclear cataract, bilateral: Secondary | ICD-10-CM | POA: Diagnosis not present

## 2017-08-20 DIAGNOSIS — Z79899 Other long term (current) drug therapy: Secondary | ICD-10-CM | POA: Diagnosis not present

## 2017-08-20 DIAGNOSIS — H30033 Focal chorioretinal inflammation, peripheral, bilateral: Secondary | ICD-10-CM | POA: Diagnosis not present

## 2017-08-20 DIAGNOSIS — H35063 Retinal vasculitis, bilateral: Secondary | ICD-10-CM | POA: Diagnosis not present

## 2017-08-20 DIAGNOSIS — H35033 Hypertensive retinopathy, bilateral: Secondary | ICD-10-CM | POA: Diagnosis not present

## 2017-08-20 DIAGNOSIS — H16002 Unspecified corneal ulcer, left eye: Secondary | ICD-10-CM | POA: Diagnosis not present

## 2017-08-21 DIAGNOSIS — J3089 Other allergic rhinitis: Secondary | ICD-10-CM | POA: Diagnosis not present

## 2017-08-21 DIAGNOSIS — J301 Allergic rhinitis due to pollen: Secondary | ICD-10-CM | POA: Diagnosis not present

## 2017-08-26 DIAGNOSIS — J3089 Other allergic rhinitis: Secondary | ICD-10-CM | POA: Diagnosis not present

## 2017-08-26 DIAGNOSIS — J301 Allergic rhinitis due to pollen: Secondary | ICD-10-CM | POA: Diagnosis not present

## 2017-08-26 DIAGNOSIS — J3081 Allergic rhinitis due to animal (cat) (dog) hair and dander: Secondary | ICD-10-CM | POA: Diagnosis not present

## 2017-08-28 DIAGNOSIS — J3089 Other allergic rhinitis: Secondary | ICD-10-CM | POA: Diagnosis not present

## 2017-08-28 DIAGNOSIS — J301 Allergic rhinitis due to pollen: Secondary | ICD-10-CM | POA: Diagnosis not present

## 2017-08-28 DIAGNOSIS — J3081 Allergic rhinitis due to animal (cat) (dog) hair and dander: Secondary | ICD-10-CM | POA: Diagnosis not present

## 2017-09-02 DIAGNOSIS — J3081 Allergic rhinitis due to animal (cat) (dog) hair and dander: Secondary | ICD-10-CM | POA: Diagnosis not present

## 2017-09-02 DIAGNOSIS — J301 Allergic rhinitis due to pollen: Secondary | ICD-10-CM | POA: Diagnosis not present

## 2017-09-02 DIAGNOSIS — J3089 Other allergic rhinitis: Secondary | ICD-10-CM | POA: Diagnosis not present

## 2017-09-04 DIAGNOSIS — J301 Allergic rhinitis due to pollen: Secondary | ICD-10-CM | POA: Diagnosis not present

## 2017-09-04 DIAGNOSIS — J3081 Allergic rhinitis due to animal (cat) (dog) hair and dander: Secondary | ICD-10-CM | POA: Diagnosis not present

## 2017-09-04 DIAGNOSIS — J3089 Other allergic rhinitis: Secondary | ICD-10-CM | POA: Diagnosis not present

## 2017-09-09 DIAGNOSIS — J3089 Other allergic rhinitis: Secondary | ICD-10-CM | POA: Diagnosis not present

## 2017-09-09 DIAGNOSIS — J3081 Allergic rhinitis due to animal (cat) (dog) hair and dander: Secondary | ICD-10-CM | POA: Diagnosis not present

## 2017-09-09 DIAGNOSIS — J301 Allergic rhinitis due to pollen: Secondary | ICD-10-CM | POA: Diagnosis not present

## 2017-09-11 ENCOUNTER — Other Ambulatory Visit: Payer: Self-pay | Admitting: Internal Medicine

## 2017-09-11 DIAGNOSIS — J3089 Other allergic rhinitis: Secondary | ICD-10-CM | POA: Diagnosis not present

## 2017-09-11 DIAGNOSIS — J301 Allergic rhinitis due to pollen: Secondary | ICD-10-CM | POA: Diagnosis not present

## 2017-09-11 DIAGNOSIS — J45990 Exercise induced bronchospasm: Secondary | ICD-10-CM | POA: Diagnosis not present

## 2017-09-11 DIAGNOSIS — J3081 Allergic rhinitis due to animal (cat) (dog) hair and dander: Secondary | ICD-10-CM | POA: Diagnosis not present

## 2017-09-12 ENCOUNTER — Other Ambulatory Visit: Payer: Self-pay | Admitting: Allergy and Immunology

## 2017-09-12 ENCOUNTER — Ambulatory Visit
Admission: RE | Admit: 2017-09-12 | Discharge: 2017-09-12 | Disposition: A | Discharge status: Home / Self Care | Payer: Medicare HMO | Source: Ambulatory Visit | Attending: Allergy and Immunology | Admitting: Allergy and Immunology

## 2017-09-12 DIAGNOSIS — J45990 Exercise induced bronchospasm: Secondary | ICD-10-CM

## 2017-09-12 DIAGNOSIS — R0602 Shortness of breath: Secondary | ICD-10-CM | POA: Diagnosis not present

## 2017-09-12 NOTE — Telephone Encounter (Signed)
Done erx 

## 2017-09-16 DIAGNOSIS — J3089 Other allergic rhinitis: Secondary | ICD-10-CM | POA: Diagnosis not present

## 2017-09-16 DIAGNOSIS — J3081 Allergic rhinitis due to animal (cat) (dog) hair and dander: Secondary | ICD-10-CM | POA: Diagnosis not present

## 2017-09-16 DIAGNOSIS — J301 Allergic rhinitis due to pollen: Secondary | ICD-10-CM | POA: Diagnosis not present

## 2017-09-18 DIAGNOSIS — J301 Allergic rhinitis due to pollen: Secondary | ICD-10-CM | POA: Diagnosis not present

## 2017-09-18 DIAGNOSIS — J3081 Allergic rhinitis due to animal (cat) (dog) hair and dander: Secondary | ICD-10-CM | POA: Diagnosis not present

## 2017-09-18 DIAGNOSIS — J3089 Other allergic rhinitis: Secondary | ICD-10-CM | POA: Diagnosis not present

## 2017-09-23 DIAGNOSIS — J3089 Other allergic rhinitis: Secondary | ICD-10-CM | POA: Diagnosis not present

## 2017-09-23 DIAGNOSIS — J3081 Allergic rhinitis due to animal (cat) (dog) hair and dander: Secondary | ICD-10-CM | POA: Diagnosis not present

## 2017-09-23 DIAGNOSIS — J301 Allergic rhinitis due to pollen: Secondary | ICD-10-CM | POA: Diagnosis not present

## 2017-09-25 ENCOUNTER — Other Ambulatory Visit: Payer: Self-pay | Admitting: Internal Medicine

## 2017-09-25 DIAGNOSIS — J3089 Other allergic rhinitis: Secondary | ICD-10-CM | POA: Diagnosis not present

## 2017-09-25 DIAGNOSIS — J3081 Allergic rhinitis due to animal (cat) (dog) hair and dander: Secondary | ICD-10-CM | POA: Diagnosis not present

## 2017-09-25 DIAGNOSIS — J301 Allergic rhinitis due to pollen: Secondary | ICD-10-CM | POA: Diagnosis not present

## 2017-09-30 DIAGNOSIS — J3089 Other allergic rhinitis: Secondary | ICD-10-CM | POA: Diagnosis not present

## 2017-09-30 DIAGNOSIS — J3081 Allergic rhinitis due to animal (cat) (dog) hair and dander: Secondary | ICD-10-CM | POA: Diagnosis not present

## 2017-09-30 DIAGNOSIS — J301 Allergic rhinitis due to pollen: Secondary | ICD-10-CM | POA: Diagnosis not present

## 2017-10-02 DIAGNOSIS — J301 Allergic rhinitis due to pollen: Secondary | ICD-10-CM | POA: Diagnosis not present

## 2017-10-02 DIAGNOSIS — J3089 Other allergic rhinitis: Secondary | ICD-10-CM | POA: Diagnosis not present

## 2017-10-02 DIAGNOSIS — J3081 Allergic rhinitis due to animal (cat) (dog) hair and dander: Secondary | ICD-10-CM | POA: Diagnosis not present

## 2017-10-08 DIAGNOSIS — J3081 Allergic rhinitis due to animal (cat) (dog) hair and dander: Secondary | ICD-10-CM | POA: Diagnosis not present

## 2017-10-08 DIAGNOSIS — J301 Allergic rhinitis due to pollen: Secondary | ICD-10-CM | POA: Diagnosis not present

## 2017-10-08 DIAGNOSIS — J3089 Other allergic rhinitis: Secondary | ICD-10-CM | POA: Diagnosis not present

## 2017-10-13 ENCOUNTER — Encounter: Payer: Self-pay | Admitting: Internal Medicine

## 2017-10-14 ENCOUNTER — Other Ambulatory Visit: Payer: Self-pay | Admitting: Internal Medicine

## 2017-10-14 ENCOUNTER — Ambulatory Visit (INDEPENDENT_AMBULATORY_CARE_PROVIDER_SITE_OTHER): Payer: Medicare HMO | Admitting: Internal Medicine

## 2017-10-14 ENCOUNTER — Telehealth: Payer: Self-pay | Admitting: Internal Medicine

## 2017-10-14 ENCOUNTER — Encounter: Payer: Self-pay | Admitting: Internal Medicine

## 2017-10-14 ENCOUNTER — Other Ambulatory Visit (INDEPENDENT_AMBULATORY_CARE_PROVIDER_SITE_OTHER): Payer: Medicare HMO

## 2017-10-14 VITALS — BP 126/84 | HR 95 | Temp 98.0°F | Resp 18 | Wt 296.0 lb

## 2017-10-14 DIAGNOSIS — I10 Essential (primary) hypertension: Secondary | ICD-10-CM

## 2017-10-14 DIAGNOSIS — R739 Hyperglycemia, unspecified: Secondary | ICD-10-CM

## 2017-10-14 DIAGNOSIS — R350 Frequency of micturition: Secondary | ICD-10-CM

## 2017-10-14 DIAGNOSIS — Z0001 Encounter for general adult medical examination with abnormal findings: Secondary | ICD-10-CM

## 2017-10-14 LAB — HEPATIC FUNCTION PANEL
ALBUMIN: 3.9 g/dL (ref 3.5–5.2)
ALT: 13 U/L (ref 0–35)
AST: 12 U/L (ref 0–37)
Alkaline Phosphatase: 72 U/L (ref 39–117)
Bilirubin, Direct: 0.1 mg/dL (ref 0.0–0.3)
TOTAL PROTEIN: 8 g/dL (ref 6.0–8.3)
Total Bilirubin: 0.4 mg/dL (ref 0.2–1.2)

## 2017-10-14 LAB — URINALYSIS, ROUTINE W REFLEX MICROSCOPIC
Bilirubin Urine: NEGATIVE
Hgb urine dipstick: NEGATIVE
Ketones, ur: NEGATIVE
Leukocytes, UA: NEGATIVE
Nitrite: NEGATIVE
PH: 6.5 (ref 5.0–8.0)
SPECIFIC GRAVITY, URINE: 1.015 (ref 1.000–1.030)
TOTAL PROTEIN, URINE-UPE24: NEGATIVE
URINE GLUCOSE: NEGATIVE
UROBILINOGEN UA: 0.2 (ref 0.0–1.0)

## 2017-10-14 LAB — HEMOGLOBIN A1C: HEMOGLOBIN A1C: 6 % (ref 4.6–6.5)

## 2017-10-14 LAB — TSH: TSH: 1.08 u[IU]/mL (ref 0.35–4.50)

## 2017-10-14 LAB — CBC WITH DIFFERENTIAL/PLATELET
Basophils Absolute: 0.1 10*3/uL (ref 0.0–0.1)
Basophils Relative: 0.8 % (ref 0.0–3.0)
EOS PCT: 3.7 % (ref 0.0–5.0)
Eosinophils Absolute: 0.2 10*3/uL (ref 0.0–0.7)
HEMATOCRIT: 43.8 % (ref 36.0–46.0)
Hemoglobin: 14.3 g/dL (ref 12.0–15.0)
LYMPHS PCT: 32.5 % (ref 12.0–46.0)
Lymphs Abs: 2.2 10*3/uL (ref 0.7–4.0)
MCHC: 32.6 g/dL (ref 30.0–36.0)
MCV: 89.1 fl (ref 78.0–100.0)
MONOS PCT: 6 % (ref 3.0–12.0)
Monocytes Absolute: 0.4 10*3/uL (ref 0.1–1.0)
NEUTROS ABS: 3.8 10*3/uL (ref 1.4–7.7)
Neutrophils Relative %: 57 % (ref 43.0–77.0)
Platelets: 297 10*3/uL (ref 150.0–400.0)
RBC: 4.92 Mil/uL (ref 3.87–5.11)
RDW: 15.8 % — ABNORMAL HIGH (ref 11.5–15.5)
WBC: 6.7 10*3/uL (ref 4.0–10.5)

## 2017-10-14 LAB — LIPID PANEL
CHOL/HDL RATIO: 3
CHOLESTEROL: 221 mg/dL — AB (ref 0–200)
HDL: 76.9 mg/dL (ref >39.00)
LDL CALC: 127 mg/dL — AB (ref 0–99)
NonHDL: 144.22
TRIGLYCERIDES: 88 mg/dL (ref 0.0–149.0)
VLDL: 17.6 mg/dL (ref 0.0–40.0)

## 2017-10-14 LAB — BASIC METABOLIC PANEL
BUN: 19 mg/dL (ref 6–23)
CALCIUM: 9.9 mg/dL (ref 8.4–10.5)
CHLORIDE: 102 meq/L (ref 96–112)
CO2: 30 meq/L (ref 19–32)
CREATININE: 1 mg/dL (ref 0.40–1.20)
GFR: 69.82 mL/min (ref >60.00)
Glucose, Bld: 104 mg/dL — ABNORMAL HIGH (ref 70–99)
Potassium: 4.3 mEq/L (ref 3.5–5.1)
SODIUM: 138 meq/L (ref 135–145)

## 2017-10-14 MED ORDER — SOLIFENACIN SUCCINATE 5 MG PO TABS: 5.0000 mg | ORAL_TABLET | Freq: Every day | ORAL | 3 refills | Status: DC

## 2017-10-14 NOTE — Assessment & Plan Note (Addendum)
For urine studies but likely OAB, for vesicare asd,  to f/u any worsening symptoms or concerns  In addition to the time spent performing CPE, I spent an additional 15 minutes face to face,in which greater than 50% of this time was spent in counseling and coordination of care for patient's illness as documented, including the differential dx, treatment, further evaluation and other management of urinary frequency, hyperglycemia, and HTN

## 2017-10-14 NOTE — Telephone Encounter (Signed)
Copied from [place] [id number]. Topic: Quick Communication - See Telephone Encounter >> Oct 14, 2017  5:16 PM Blase Mess A wrote: CRM for notification. See Telephone encounter for: 10/14/17.Patient Called regarding solifenacin (VESICARE) 5 MG tablet [[id number]]   Patient said that it was $900.  And it was not covered by insurance. Patient would like some alterative med  Please advise. Patients call back # [phone]

## 2017-10-14 NOTE — Assessment & Plan Note (Signed)

## 2017-10-14 NOTE — Assessment & Plan Note (Signed)
stable overall by history and exam, recent data reviewed with pt, and pt to continue medical treatment as before,  to f/u any worsening symptoms or concerns  

## 2017-10-14 NOTE — Progress Notes (Signed)
Subjective:    Patient ID: Betty Green, female    DOB: [birth year], 72 y.o.   MRN: [id number]  HPI   Here for wellness and f/u;  Overall doing ok;  Pt denies Chest pain, worsening SOB, DOE, wheezing, orthopnea, PND, worsening LE edema, palpitations, dizziness or syncope.  Pt denies neurological change such as new headache, facial or extremity weakness.  Pt denies polydipsia, polyuria, or low sugar symptoms. Pt states overall good compliance with treatment and medications, good tolerability, and has been trying to follow appropriate diet.  Pt denies worsening depressive symptoms, suicidal ideation or panic. No fever, night sweats, wt loss, loss of appetite, or other constitutional symptoms.  Pt states good ability with ADL's, has low fall risk, home safety reviewed and adequate, no other significant changes in hearing or vision, and only occasionally active with exercise. Also Here with > 3 mo urinary freq without dysuria, despite not drinking a lot of fluids, and sometimes leakage and incontinence worse with coughing, and some wetting accidents if cant to BR fast enough.  No fever, blood. Wt Readings from Last 3 Encounters:  10/14/17 296 lb (134.3 kg)  04/19/17 297 lb (134.7 kg)  04/12/17 297 lb (134.7 kg)   Past Medical History:  Diagnosis Date  . Allergy   . Anxiety    "take RX prn"  . Arthritis    "all over"  . Asthma   . Chronic bronchitis ([place])   . GERD (gastroesophageal reflux disease)   . Hypertension   . Insomnia 05/31/2011  . OSA on CPAP    wears CPAP sometimes (06/23/2014)  . Other and unspecified hyperlipidemia 08/04/2012  . PONV (postoperative nausea and vomiting)   . Shortness of breath dyspnea    with exertion  . Shoulder pain, bilateral 10/18/2010  . Vitamin D deficiency 12/21/2015   Past Surgical History:  Procedure Laterality Date  . APPENDECTOMY  1966  . BREAST EXCISIONAL BIOPSY Right   . CHOLECYSTECTOMY OPEN  1981  . FRACTURE SURGERY    . ORIF ANKLE FRACTURE  Right 06/23/2014  . ORIF ANKLE FRACTURE Right 06/23/2014   Procedure: Take Down Malunion Right Ankle, Open Reduction Internal Fixation Right Ankle, Possible Syndesmosis Repair, Medial Joint Debridement;  Surgeon: Newt Minion, MD;  Location: [facility];  Service: Orthopedics;  Laterality: Right;  . TONSILLECTOMY  1963  . TOTAL ABDOMINAL HYSTERECTOMY  1980   w/BSO    reports that she quit smoking about 33 years ago. Her smoking use included cigarettes. She has a 0.20 pack-year smoking history. She has never used smokeless tobacco. She reports that she does not drink alcohol or use drugs. family history includes Arthritis in her father, mother, and other; Cancer in her other; Heart disease in her father and mother; Hypertension in her father and mother. Allergies  Allergen Reactions  . Amoxicillin-Pot Clavulanate     Hives  . Azithromycin Other (See Comments)    abd pain  . Lipitor [Atorvastatin] Itching   Current Outpatient Medications on File Prior to Visit  Medication Sig Dispense Refill  . alendronate (FOSAMAX) 70 MG tablet Take 1 tablet (70 mg total) by mouth every 7 (seven) days. Take with a full glass of water on an empty stomach. 12 tablet 3  . furosemide (LASIX) 80 MG tablet Take 1 tablet (80 mg total) by mouth 2 (two) times daily. 180 tablet 3  . hydrOXYzine (ATARAX/VISTARIL) 25 MG tablet TAKE 1-2 TABS BY MOUTH THREE TIMES PER DAY AS NEEDED FOR ITCHING  540 tablet 1  . traMADol (ULTRAM) 50 MG tablet TAKE 1 TABLET (50 MG TOTAL) BY MOUTH EVERY 8 (EIGHT) HOURS AS NEEDED. 120 tablet 2   No current facility-administered medications on file prior to visit.    Review of Systems  Constitutional: Negative for other unusual diaphoresis or sweats HENT: Negative for ear discharge or swelling Eyes: Negative for other worsening visual disturbances Respiratory: Negative for stridor or other swelling  Gastrointestinal: Negative for worsening distension or other blood Genitourinary: Negative for  retention or other urinary change Musculoskeletal: Negative for other MSK pain or swelling Skin: Negative for color change or other new lesions Neurological: Negative for worsening tremors and other numbness  Psychiatric/Behavioral: Negative for worsening agitation or other fatigue All other system neg per pt    Objective:   Physical Exam BP 126/84   Pulse 95   Temp 98 F (36.7 C) (Oral)   Resp 18   Wt 296 lb (134.3 kg)   SpO2 96%   BMI 54.14 kg/m  VS noted,  Constitutional: Pt is oriented to person, place, and time. Appears well-developed and well-nourished, in no significant distress and comfortable Head: Normocephalic and atraumatic  Eyes: Conjunctivae and EOM are normal. Pupils are equal, round, and reactive to light Right Ear: External ear normal without discharge Left Ear: External ear normal without discharge Nose: Nose without discharge or deformity Mouth/Throat: Oropharynx is without other ulcerations and moist  Neck: Normal range of motion. Neck supple. No JVD present. No tracheal deviation present or significant neck LA or mass Cardiovascular: Normal rate, regular rhythm, normal heart sounds and intact distal pulses.   Pulmonary/Chest: WOB normal and breath sounds without rales or wheezing  Abdominal: Soft. Bowel sounds are normal. NT. No HSM  Musculoskeletal: Normal range of motion. Exhibits no edema Lymphadenopathy: Has no other cervical adenopathy.  Neurological: Pt is alert and oriented to person, place, and time. Pt has normal reflexes. No cranial nerve deficit. Motor grossly intact, Gait intact Skin: Skin is warm and dry. No rash noted or new ulcerations Psychiatric:  Has normal mood and affect. Behavior is normal without agitation No other exam findings Lab Results  Component Value Date   WBC 6.7 10/14/2017   HGB 14.3 10/14/2017   HCT 43.8 10/14/2017   PLT 297.0 10/14/2017   GLUCOSE 104 (H) 10/14/2017   CHOL 221 (H) 10/14/2017   TRIG 88.0 10/14/2017    HDL 76.90 10/14/2017   LDLDIRECT 134.1 05/02/2012   LDLCALC 127 (H) 10/14/2017   ALT 13 10/14/2017   AST 12 10/14/2017   NA 138 10/14/2017   K 4.3 10/14/2017   CL 102 10/14/2017   CREATININE 1.00 10/14/2017   BUN 19 10/14/2017   CO2 30 10/14/2017   TSH 1.08 10/14/2017   INR 1.05 06/22/2014   HGBA1C 6.0 10/14/2017       Assessment & Plan:

## 2017-10-14 NOTE — Patient Instructions (Signed)
Please take all new medication as prescribed - the Vesicare, but have the pharmacy call if this is not covered, and let us know if something like Toviaz or Detrol or Oxybutinin is covered  Please continue all other medications as before, and refills have been done if requested.  Please have the pharmacy call with any other refills you may need.  Please continue your efforts at being more active, low cholesterol diet, and weight control.  You are otherwise up to date with prevention measures today.  Please keep your appointments with your specialists as you may have planned  Please go to the LAB in the Basement (turn left off the elevator) for the tests to be done today  You will be contacted by phone if any changes need to be made immediately.  Otherwise, you will receive a letter about your results with an explanation, but please check with MyChart first.  Please remember to sign up for MyChart if you have not done so, as this will be important to you in the future with finding out test results, communicating by private email, and scheduling acute appointments online when needed.  Please return in 6 months, or sooner if needed

## 2017-10-15 DIAGNOSIS — J301 Allergic rhinitis due to pollen: Secondary | ICD-10-CM | POA: Diagnosis not present

## 2017-10-15 DIAGNOSIS — J3081 Allergic rhinitis due to animal (cat) (dog) hair and dander: Secondary | ICD-10-CM | POA: Diagnosis not present

## 2017-10-15 DIAGNOSIS — J3089 Other allergic rhinitis: Secondary | ICD-10-CM | POA: Diagnosis not present

## 2017-10-15 LAB — URINE CULTURE
MICRO NUMBER: 91075286
SPECIMEN QUALITY:: ADEQUATE

## 2017-10-15 NOTE — Telephone Encounter (Signed)
[place] for change to [place] - done erx

## 2017-10-15 NOTE — Telephone Encounter (Signed)
Per pharmacy, insurance wants oxybutin.

## 2017-10-16 ENCOUNTER — Other Ambulatory Visit: Payer: Self-pay | Admitting: Internal Medicine

## 2017-10-16 MED ORDER — FESOTERODINE FUMARATE ER 4 MG PO TB24: 4.0000 mg | ORAL_TABLET | Freq: Every day | ORAL | 3 refills | Status: DC

## 2017-10-16 NOTE — Telephone Encounter (Signed)
Pharmacy advised pt they are awaiting medication instructions before they can fill.  [id number] [address], [place] - [place]

## 2017-10-16 NOTE — Telephone Encounter (Signed)
Per Dr. Jenny Reichmann sig is 1 po qd. New Rx sent. See meds.

## 2017-10-16 NOTE — Addendum Note (Signed)
Addended by: Cresenciano Lick on: 10/16/2017 04:56 PM   Modules accepted: Orders

## 2017-10-16 NOTE — Telephone Encounter (Signed)
Medication has been changed

## 2017-10-17 ENCOUNTER — Encounter: Payer: Self-pay | Admitting: Internal Medicine

## 2017-10-23 ENCOUNTER — Telehealth: Payer: Self-pay

## 2017-10-23 DIAGNOSIS — J3081 Allergic rhinitis due to animal (cat) (dog) hair and dander: Secondary | ICD-10-CM | POA: Diagnosis not present

## 2017-10-23 DIAGNOSIS — J301 Allergic rhinitis due to pollen: Secondary | ICD-10-CM | POA: Diagnosis not present

## 2017-10-23 DIAGNOSIS — J3089 Other allergic rhinitis: Secondary | ICD-10-CM | POA: Diagnosis not present

## 2017-10-23 NOTE — Telephone Encounter (Signed)
Copied from [place] [id number]. Topic: General - Other >> Oct 23, 2017 10:14 AM Cecelia Byars, NT wrote: Reason for CRM: Patient called and said she needs a letter faxed to her insurance agent , stating she was prescribed  naltrexone 50 mg for weight loss  only please fax to  [phone] , please call her once this done at [phone]

## 2017-10-23 NOTE — Telephone Encounter (Signed)
[place] for letter, can you do this?  thanks

## 2017-10-24 NOTE — Telephone Encounter (Signed)
Letter done and faxed  Called pt, LVM.

## 2017-10-29 DIAGNOSIS — J3081 Allergic rhinitis due to animal (cat) (dog) hair and dander: Secondary | ICD-10-CM | POA: Diagnosis not present

## 2017-10-29 DIAGNOSIS — J301 Allergic rhinitis due to pollen: Secondary | ICD-10-CM | POA: Diagnosis not present

## 2017-10-29 DIAGNOSIS — J3089 Other allergic rhinitis: Secondary | ICD-10-CM | POA: Diagnosis not present

## 2017-10-31 DIAGNOSIS — J301 Allergic rhinitis due to pollen: Secondary | ICD-10-CM | POA: Diagnosis not present

## 2017-10-31 DIAGNOSIS — J3081 Allergic rhinitis due to animal (cat) (dog) hair and dander: Secondary | ICD-10-CM | POA: Diagnosis not present

## 2017-10-31 DIAGNOSIS — J3089 Other allergic rhinitis: Secondary | ICD-10-CM | POA: Diagnosis not present

## 2017-11-05 ENCOUNTER — Telehealth: Payer: Self-pay | Admitting: Internal Medicine

## 2017-11-05 DIAGNOSIS — J3081 Allergic rhinitis due to animal (cat) (dog) hair and dander: Secondary | ICD-10-CM | POA: Diagnosis not present

## 2017-11-05 DIAGNOSIS — Z79899 Other long term (current) drug therapy: Secondary | ICD-10-CM | POA: Diagnosis not present

## 2017-11-05 DIAGNOSIS — H35063 Retinal vasculitis, bilateral: Secondary | ICD-10-CM | POA: Diagnosis not present

## 2017-11-05 DIAGNOSIS — H30033 Focal chorioretinal inflammation, peripheral, bilateral: Secondary | ICD-10-CM | POA: Diagnosis not present

## 2017-11-05 DIAGNOSIS — J301 Allergic rhinitis due to pollen: Secondary | ICD-10-CM | POA: Diagnosis not present

## 2017-11-05 DIAGNOSIS — H16002 Unspecified corneal ulcer, left eye: Secondary | ICD-10-CM | POA: Diagnosis not present

## 2017-11-05 DIAGNOSIS — H35033 Hypertensive retinopathy, bilateral: Secondary | ICD-10-CM | POA: Diagnosis not present

## 2017-11-05 DIAGNOSIS — H2513 Age-related nuclear cataract, bilateral: Secondary | ICD-10-CM | POA: Diagnosis not present

## 2017-11-05 DIAGNOSIS — H269 Unspecified cataract: Secondary | ICD-10-CM

## 2017-11-05 DIAGNOSIS — J3089 Other allergic rhinitis: Secondary | ICD-10-CM | POA: Diagnosis not present

## 2017-11-05 NOTE — Telephone Encounter (Signed)
Copied from Betty Green [id number]. Topic: Quick Communication - See Telephone Encounter >> Nov 05, 2017  1:57 PM Gardiner Ramus wrote: CRM for notification. See Telephone encounter for: 11/05/17. Pt called and stated that he would like a referral for cataract surgery. Please advise

## 2017-11-05 NOTE — Telephone Encounter (Signed)
Ok, this is done 

## 2017-11-14 ENCOUNTER — Other Ambulatory Visit: Payer: Self-pay | Admitting: Internal Medicine

## 2017-11-14 DIAGNOSIS — Z79899 Other long term (current) drug therapy: Secondary | ICD-10-CM | POA: Diagnosis not present

## 2017-11-14 DIAGNOSIS — H16002 Unspecified corneal ulcer, left eye: Secondary | ICD-10-CM | POA: Diagnosis not present

## 2017-11-14 DIAGNOSIS — H30033 Focal chorioretinal inflammation, peripheral, bilateral: Secondary | ICD-10-CM | POA: Diagnosis not present

## 2017-11-14 DIAGNOSIS — J301 Allergic rhinitis due to pollen: Secondary | ICD-10-CM | POA: Diagnosis not present

## 2017-11-14 DIAGNOSIS — J3089 Other allergic rhinitis: Secondary | ICD-10-CM | POA: Diagnosis not present

## 2017-11-14 DIAGNOSIS — J3081 Allergic rhinitis due to animal (cat) (dog) hair and dander: Secondary | ICD-10-CM | POA: Diagnosis not present

## 2017-11-21 DIAGNOSIS — J301 Allergic rhinitis due to pollen: Secondary | ICD-10-CM | POA: Diagnosis not present

## 2017-11-21 DIAGNOSIS — J3089 Other allergic rhinitis: Secondary | ICD-10-CM | POA: Diagnosis not present

## 2017-11-21 DIAGNOSIS — J3081 Allergic rhinitis due to animal (cat) (dog) hair and dander: Secondary | ICD-10-CM | POA: Diagnosis not present

## 2017-11-26 DIAGNOSIS — J301 Allergic rhinitis due to pollen: Secondary | ICD-10-CM | POA: Diagnosis not present

## 2017-11-26 DIAGNOSIS — Z23 Encounter for immunization: Secondary | ICD-10-CM | POA: Diagnosis not present

## 2017-11-26 DIAGNOSIS — J3089 Other allergic rhinitis: Secondary | ICD-10-CM | POA: Diagnosis not present

## 2017-11-28 DIAGNOSIS — J301 Allergic rhinitis due to pollen: Secondary | ICD-10-CM | POA: Diagnosis not present

## 2017-11-28 DIAGNOSIS — J3081 Allergic rhinitis due to animal (cat) (dog) hair and dander: Secondary | ICD-10-CM | POA: Diagnosis not present

## 2017-11-28 DIAGNOSIS — J3089 Other allergic rhinitis: Secondary | ICD-10-CM | POA: Diagnosis not present

## 2017-12-03 DIAGNOSIS — J301 Allergic rhinitis due to pollen: Secondary | ICD-10-CM | POA: Diagnosis not present

## 2017-12-03 DIAGNOSIS — J3081 Allergic rhinitis due to animal (cat) (dog) hair and dander: Secondary | ICD-10-CM | POA: Diagnosis not present

## 2017-12-03 DIAGNOSIS — J3089 Other allergic rhinitis: Secondary | ICD-10-CM | POA: Diagnosis not present

## 2017-12-03 NOTE — Telephone Encounter (Signed)
Error

## 2017-12-05 DIAGNOSIS — J301 Allergic rhinitis due to pollen: Secondary | ICD-10-CM | POA: Diagnosis not present

## 2017-12-05 DIAGNOSIS — J3089 Other allergic rhinitis: Secondary | ICD-10-CM | POA: Diagnosis not present

## 2017-12-05 DIAGNOSIS — J3081 Allergic rhinitis due to animal (cat) (dog) hair and dander: Secondary | ICD-10-CM | POA: Diagnosis not present

## 2017-12-09 DIAGNOSIS — H01025 Squamous blepharitis left lower eyelid: Secondary | ICD-10-CM | POA: Diagnosis not present

## 2017-12-09 DIAGNOSIS — H2513 Age-related nuclear cataract, bilateral: Secondary | ICD-10-CM | POA: Diagnosis not present

## 2017-12-09 DIAGNOSIS — H01022 Squamous blepharitis right lower eyelid: Secondary | ICD-10-CM | POA: Diagnosis not present

## 2017-12-09 DIAGNOSIS — H01024 Squamous blepharitis left upper eyelid: Secondary | ICD-10-CM | POA: Diagnosis not present

## 2017-12-09 DIAGNOSIS — H01021 Squamous blepharitis right upper eyelid: Secondary | ICD-10-CM | POA: Diagnosis not present

## 2017-12-09 DIAGNOSIS — H17823 Peripheral opacity of cornea, bilateral: Secondary | ICD-10-CM | POA: Diagnosis not present

## 2017-12-09 DIAGNOSIS — H04123 Dry eye syndrome of bilateral lacrimal glands: Secondary | ICD-10-CM | POA: Diagnosis not present

## 2017-12-12 DIAGNOSIS — J301 Allergic rhinitis due to pollen: Secondary | ICD-10-CM | POA: Diagnosis not present

## 2017-12-12 DIAGNOSIS — J3089 Other allergic rhinitis: Secondary | ICD-10-CM | POA: Diagnosis not present

## 2017-12-12 DIAGNOSIS — J3081 Allergic rhinitis due to animal (cat) (dog) hair and dander: Secondary | ICD-10-CM | POA: Diagnosis not present

## 2017-12-17 DIAGNOSIS — J301 Allergic rhinitis due to pollen: Secondary | ICD-10-CM | POA: Diagnosis not present

## 2017-12-17 DIAGNOSIS — J3081 Allergic rhinitis due to animal (cat) (dog) hair and dander: Secondary | ICD-10-CM | POA: Diagnosis not present

## 2017-12-17 DIAGNOSIS — J3089 Other allergic rhinitis: Secondary | ICD-10-CM | POA: Diagnosis not present

## 2017-12-19 DIAGNOSIS — H2511 Age-related nuclear cataract, right eye: Secondary | ICD-10-CM | POA: Diagnosis not present

## 2017-12-19 DIAGNOSIS — H268 Other specified cataract: Secondary | ICD-10-CM | POA: Diagnosis not present

## 2017-12-24 DIAGNOSIS — J3089 Other allergic rhinitis: Secondary | ICD-10-CM | POA: Diagnosis not present

## 2017-12-24 DIAGNOSIS — J3081 Allergic rhinitis due to animal (cat) (dog) hair and dander: Secondary | ICD-10-CM | POA: Diagnosis not present

## 2017-12-24 DIAGNOSIS — J301 Allergic rhinitis due to pollen: Secondary | ICD-10-CM | POA: Diagnosis not present

## 2017-12-31 DIAGNOSIS — J3089 Other allergic rhinitis: Secondary | ICD-10-CM | POA: Diagnosis not present

## 2017-12-31 DIAGNOSIS — J301 Allergic rhinitis due to pollen: Secondary | ICD-10-CM | POA: Diagnosis not present

## 2017-12-31 DIAGNOSIS — J3081 Allergic rhinitis due to animal (cat) (dog) hair and dander: Secondary | ICD-10-CM | POA: Diagnosis not present

## 2018-01-06 DIAGNOSIS — H2512 Age-related nuclear cataract, left eye: Secondary | ICD-10-CM | POA: Diagnosis not present

## 2018-01-07 ENCOUNTER — Ambulatory Visit: Payer: Medicare HMO | Admitting: Family Medicine

## 2018-01-07 DIAGNOSIS — Z0289 Encounter for other administrative examinations: Secondary | ICD-10-CM

## 2018-01-08 DIAGNOSIS — J301 Allergic rhinitis due to pollen: Secondary | ICD-10-CM | POA: Diagnosis not present

## 2018-01-08 DIAGNOSIS — J3089 Other allergic rhinitis: Secondary | ICD-10-CM | POA: Diagnosis not present

## 2018-01-08 DIAGNOSIS — J3081 Allergic rhinitis due to animal (cat) (dog) hair and dander: Secondary | ICD-10-CM | POA: Diagnosis not present

## 2018-01-09 DIAGNOSIS — H2512 Age-related nuclear cataract, left eye: Secondary | ICD-10-CM | POA: Diagnosis not present

## 2018-02-02 ENCOUNTER — Encounter: Payer: Self-pay | Admitting: Internal Medicine

## 2018-02-03 ENCOUNTER — Encounter: Payer: Self-pay | Admitting: Family Medicine

## 2018-02-03 MED ORDER — TRAMADOL HCL 50 MG PO TABS: 50.0000 mg | ORAL_TABLET | Freq: Four times a day (QID) | ORAL | 2 refills | Status: DC | PRN

## 2018-02-03 NOTE — Telephone Encounter (Signed)
Done erx 

## 2018-02-04 ENCOUNTER — Other Ambulatory Visit: Payer: Self-pay | Admitting: Family Medicine

## 2018-02-04 MED ORDER — NAPROXEN 500 MG PO TABS: 500.0000 mg | ORAL_TABLET | Freq: Two times a day (BID) | ORAL | 1 refills | Status: DC | PRN

## 2018-02-13 ENCOUNTER — Other Ambulatory Visit: Payer: Self-pay | Admitting: Internal Medicine

## 2018-02-15 ENCOUNTER — Other Ambulatory Visit: Payer: Self-pay | Admitting: Internal Medicine

## 2018-03-06 ENCOUNTER — Ambulatory Visit (INDEPENDENT_AMBULATORY_CARE_PROVIDER_SITE_OTHER): Payer: Medicare HMO | Admitting: Family Medicine

## 2018-03-06 ENCOUNTER — Ambulatory Visit (INDEPENDENT_AMBULATORY_CARE_PROVIDER_SITE_OTHER): Payer: Medicare HMO

## 2018-03-06 DIAGNOSIS — M170 Bilateral primary osteoarthritis of knee: Secondary | ICD-10-CM | POA: Diagnosis not present

## 2018-03-06 NOTE — Patient Instructions (Signed)
Good to see you  Please try ice if needed  Take tylenol 650 mg three times a day is the best evidence based medicine we have for arthritis.  Glucosamine sulfate 750mg  twice a day is a supplement that has been shown to help moderate to severe arthritis. Vitamin D 2000 IU daily Fish oil 2 grams daily.  Tumeric 500mg  twice daily.  Capsaicin topically up to four times a day may also help with pain. Cortisone injections are an option if these interventions do not seem to make a difference or need more relief.  If cortisone injections do not help, there are different types of shots that may help but they take longer to take effect.  We can discuss this at follow up.  It's important that you continue to stay active. Controlling your weight is important.  Please see me back in 4 weeks if no better.

## 2018-03-06 NOTE — Progress Notes (Signed)
Betty Green - 73 y.o. female MRN [id number]  Date of birth: 05-Dec-1945  SUBJECTIVE:  Including CC & ROS.  Chief Complaint  Patient presents with  . Knee Pain    pt has been experiencing pain in both knees for years , pt denies any injury , pt sts she does have h/o arthritis     Betty Green is a 73 y.o. female that is presenting with bilateral knee pain.  This pain is acute on chronic in nature.  Denies any inciting event.  The pain is sharp and stabbing and throbbing in nature.  The pain is moderate to severe and intermittent in nature.  Has the pain over the medial compartment in each knee.  Denies any locking or giving way.  Has been using oral medications to get by as best she can.  Has not received injections since last year.  Pain is localized to the knee.  Independent review of the left and right knee x-ray from 04/19/2017 shows severe osteoarthritis of the right medial joint line and of the left patellofemoral joint.   Review of Systems  Constitutional: Negative for fever.  HENT: Negative for congestion.   Respiratory: Negative for cough.   Cardiovascular: Negative for chest pain.  Gastrointestinal: Negative for abdominal pain.  Musculoskeletal: Positive for arthralgias and gait problem.  Skin: Negative for color change.  Neurological: Negative for weakness.  Hematological: Negative for adenopathy.  Psychiatric/Behavioral: Negative for agitation.    HISTORY: Past Medical, Surgical, Social, and Family History Reviewed & Updated per EMR.   Pertinent Historical Findings include:  Past Medical History:  Diagnosis Date  . Allergy   . Anxiety    "take RX prn"  . Arthritis    "all over"  . Asthma   . Chronic bronchitis ([place])   . GERD (gastroesophageal reflux disease)   . Hypertension   . Insomnia 05/31/2011  . OSA on CPAP    wears CPAP sometimes (06/23/2014)  . Other and unspecified hyperlipidemia 08/04/2012  . PONV (postoperative nausea and vomiting)   . Shortness of  breath dyspnea    with exertion  . Shoulder pain, bilateral 10/18/2010  . Vitamin D deficiency 12/21/2015    Past Surgical History:  Procedure Laterality Date  . APPENDECTOMY  1966  . BREAST EXCISIONAL BIOPSY Right   . CHOLECYSTECTOMY OPEN  1981  . FRACTURE SURGERY    . ORIF ANKLE FRACTURE Right 06/23/2014  . ORIF ANKLE FRACTURE Right 06/23/2014   Procedure: Take Down Malunion Right Ankle, Open Reduction Internal Fixation Right Ankle, Possible Syndesmosis Repair, Medial Joint Debridement;  Surgeon: Newt Minion, MD;  Location: [place];  Service: Orthopedics;  Laterality: Right;  . TONSILLECTOMY  1963  . TOTAL ABDOMINAL HYSTERECTOMY  1980   w/BSO    Allergies  Allergen Reactions  . Amoxicillin-Pot Clavulanate     Hives  . Azithromycin Other (See Comments)    abd pain  . Lipitor [Atorvastatin] Itching    Family History  Problem Relation Age of Onset  . Arthritis Other   . Arthritis Mother   . Heart disease Mother   . Hypertension Mother   . Arthritis Father   . Heart disease Father   . Hypertension Father   . Cancer Other        colon and prostate cancer     Social History   Socioeconomic History  . Marital status: Single    Spouse name: Not on file  . Number of children: 2  .  Years of education: 40  . Highest education level: Not on file  Occupational History  . Occupation: retired    Fish farm manager: [employer]  . Financial resource strain: Not on file  . Food insecurity:    Worry: Not on file    Inability: Not on file  . Transportation needs:    Medical: Not on file    Non-medical: Not on file  Tobacco Use  . Smoking status: Former Smoker    Packs/day: 0.10    Years: 2.00    Pack years: 0.20    Types: Cigarettes    Last attempt to quit: 02/06/1984    Years since quitting: 34.1  . Smokeless tobacco: Never Used  Substance and Sexual Activity  . Alcohol use: No  . Drug use: No  . Sexual activity: Not Currently  Lifestyle  .  Physical activity:    Days per week: Not on file    Minutes per session: Not on file  . Stress: Not on file  Relationships  . Social connections:    Talks on phone: Not on file    Gets together: Not on file    Attends religious service: Not on file    Active member of club or organization: Not on file    Attends meetings of clubs or organizations: Not on file    Relationship status: Not on file  . Intimate partner violence:    Fear of current or ex partner: Not on file    Emotionally abused: Not on file    Physically abused: Not on file    Forced sexual activity: Not on file  Other Topics Concern  . Not on file  Social History Narrative  . Not on file     PHYSICAL EXAM:  VS: There were no vitals taken for this visit. Physical Exam Gen: NAD, alert, cooperative with exam, well-appearing ENT: normal lips, normal nasal mucosa,  Eye: normal EOM, normal conjunctiva and lids CV:  no edema, +2 pedal pulses   Resp: no accessory muscle use, non-labored,  Skin: no rashes, no areas of induration  Neuro: normal tone, normal sensation to touch Psych:  normal insight, alert and oriented MSK:  Left and right knee: No obvious effusion. Normal range of motion. Normal strength resistance. Some instability with valgus and varus stress testing. No pain with patellar grind. Negative Murray's test. Neurovascular intact   Aspiration/Injection Procedure Note Betty Green December 26, 1945  Procedure: Injection Indications: Left knee pain  Procedure Details Consent: Risks of procedure as well as the alternatives and risks of each were explained to the (patient/caregiver).  Consent for procedure obtained. Time Out: Verified patient identification, verified procedure, site/side was marked, verified correct patient position, special equipment/implants available, medications/allergies/relevent history reviewed, required imaging and test results available.  Performed.  The area was cleaned with  iodine and alcohol swabs.    The left superior lateral suprapatellar pouch was injected using 1 cc's of 40 mg Kenalog and 4 cc's of 0.25% bupivacaine with a 22 1 1/2" needle.  Ultrasound was used. Images were obtained in long views showing the injection.     A sterile dressing was applied.  Patient did tolerate procedure well.   Aspiration/Injection Procedure Note Betty Green 1945-12-19  Procedure: Injection Indications: Right knee pain  Procedure Details Consent: Risks of procedure as well as the alternatives and risks of each were explained to the (patient/caregiver).  Consent for procedure obtained. Time Out: Verified patient identification, verified procedure, site/side was  marked, verified correct patient position, special equipment/implants available, medications/allergies/relevent history reviewed, required imaging and test results available.  Performed.  The area was cleaned with iodine and alcohol swabs.    The right superior lateral suprapatellar pouch was injected using 1 cc's of 40 mg Kenalog and 4 cc's of 0.25% bupivacaine with a 22 1 1/2" needle.  Ultrasound was used. Images were obtained in long views showing the injection.     A sterile dressing was applied.  Patient did tolerate procedure well.       ASSESSMENT & PLAN:   Bilateral primary osteoarthritis of knee Acute on chronic pain related to her degenerative changes. No inciting event.  - b/l knee injections today  - provided pennsaid samples - can try gel injections with hymovis.

## 2018-03-06 NOTE — Assessment & Plan Note (Signed)
Acute on chronic pain related to her degenerative changes. No inciting event.  - b/l knee injections today  - provided pennsaid samples - can try gel injections with hymovis.

## 2018-03-17 ENCOUNTER — Telehealth: Payer: Self-pay | Admitting: Internal Medicine

## 2018-03-17 NOTE — Telephone Encounter (Signed)
Insurance has been submitted and verified for Prolia. Patient is responsible for a $255 copay. Due anytime. Left message for patient to call back to schedule.  Okay to schedule... Visit Note: Prolia ($255 copay - okay to give per [place]) Visit Type: Nurse Provider: Nurse 

## 2018-04-08 DIAGNOSIS — H5213 Myopia, bilateral: Secondary | ICD-10-CM | POA: Diagnosis not present

## 2018-04-08 DIAGNOSIS — H5203 Hypermetropia, bilateral: Secondary | ICD-10-CM | POA: Diagnosis not present

## 2018-04-08 DIAGNOSIS — H52209 Unspecified astigmatism, unspecified eye: Secondary | ICD-10-CM | POA: Diagnosis not present

## 2018-04-08 DIAGNOSIS — H524 Presbyopia: Secondary | ICD-10-CM | POA: Diagnosis not present

## 2018-04-14 ENCOUNTER — Ambulatory Visit: Payer: Medicare HMO | Admitting: Internal Medicine

## 2018-04-16 DIAGNOSIS — H04123 Dry eye syndrome of bilateral lacrimal glands: Secondary | ICD-10-CM | POA: Diagnosis not present

## 2018-04-16 DIAGNOSIS — H44113 Panuveitis, bilateral: Secondary | ICD-10-CM | POA: Diagnosis not present

## 2018-04-16 DIAGNOSIS — Z961 Presence of intraocular lens: Secondary | ICD-10-CM | POA: Diagnosis not present

## 2018-04-30 ENCOUNTER — Encounter: Payer: Self-pay | Admitting: Internal Medicine

## 2018-05-19 ENCOUNTER — Other Ambulatory Visit: Payer: Self-pay | Admitting: Internal Medicine

## 2018-06-03 ENCOUNTER — Other Ambulatory Visit: Payer: Self-pay | Admitting: Internal Medicine

## 2018-06-03 NOTE — Telephone Encounter (Signed)
Done erx 

## 2018-06-12 DIAGNOSIS — M25512 Pain in left shoulder: Secondary | ICD-10-CM | POA: Diagnosis not present

## 2018-06-12 DIAGNOSIS — R2689 Other abnormalities of gait and mobility: Secondary | ICD-10-CM | POA: Diagnosis not present

## 2018-06-12 DIAGNOSIS — M222X1 Patellofemoral disorders, right knee: Secondary | ICD-10-CM | POA: Diagnosis not present

## 2018-06-12 DIAGNOSIS — M19011 Primary osteoarthritis, right shoulder: Secondary | ICD-10-CM | POA: Diagnosis not present

## 2018-06-12 DIAGNOSIS — M25761 Osteophyte, right knee: Secondary | ICD-10-CM | POA: Diagnosis not present

## 2018-06-12 DIAGNOSIS — M222X2 Patellofemoral disorders, left knee: Secondary | ICD-10-CM | POA: Diagnosis not present

## 2018-06-12 DIAGNOSIS — M25762 Osteophyte, left knee: Secondary | ICD-10-CM | POA: Diagnosis not present

## 2018-06-12 DIAGNOSIS — M1711 Unilateral primary osteoarthritis, right knee: Secondary | ICD-10-CM | POA: Diagnosis not present

## 2018-06-12 DIAGNOSIS — M1712 Unilateral primary osteoarthritis, left knee: Secondary | ICD-10-CM | POA: Diagnosis not present

## 2018-06-12 DIAGNOSIS — M25511 Pain in right shoulder: Secondary | ICD-10-CM | POA: Diagnosis not present

## 2018-06-13 DIAGNOSIS — H04123 Dry eye syndrome of bilateral lacrimal glands: Secondary | ICD-10-CM | POA: Diagnosis not present

## 2018-06-13 DIAGNOSIS — Z961 Presence of intraocular lens: Secondary | ICD-10-CM | POA: Diagnosis not present

## 2018-06-13 DIAGNOSIS — H44113 Panuveitis, bilateral: Secondary | ICD-10-CM | POA: Diagnosis not present

## 2018-06-18 DIAGNOSIS — M25762 Osteophyte, left knee: Secondary | ICD-10-CM | POA: Diagnosis not present

## 2018-06-18 DIAGNOSIS — M25512 Pain in left shoulder: Secondary | ICD-10-CM | POA: Diagnosis not present

## 2018-06-18 DIAGNOSIS — M1712 Unilateral primary osteoarthritis, left knee: Secondary | ICD-10-CM | POA: Diagnosis not present

## 2018-06-19 DIAGNOSIS — M1711 Unilateral primary osteoarthritis, right knee: Secondary | ICD-10-CM | POA: Diagnosis not present

## 2018-06-19 DIAGNOSIS — M222X1 Patellofemoral disorders, right knee: Secondary | ICD-10-CM | POA: Diagnosis not present

## 2018-06-19 DIAGNOSIS — M25511 Pain in right shoulder: Secondary | ICD-10-CM | POA: Diagnosis not present

## 2018-06-19 DIAGNOSIS — R2689 Other abnormalities of gait and mobility: Secondary | ICD-10-CM | POA: Diagnosis not present

## 2018-06-19 DIAGNOSIS — M25761 Osteophyte, right knee: Secondary | ICD-10-CM | POA: Diagnosis not present

## 2018-06-23 ENCOUNTER — Encounter: Payer: Self-pay | Admitting: Internal Medicine

## 2018-06-24 ENCOUNTER — Encounter: Payer: Self-pay | Admitting: Internal Medicine

## 2018-06-24 ENCOUNTER — Ambulatory Visit (INDEPENDENT_AMBULATORY_CARE_PROVIDER_SITE_OTHER): Payer: Medicare HMO | Admitting: Internal Medicine

## 2018-06-24 DIAGNOSIS — M79662 Pain in left lower leg: Secondary | ICD-10-CM

## 2018-06-24 DIAGNOSIS — J45909 Unspecified asthma, uncomplicated: Secondary | ICD-10-CM | POA: Diagnosis not present

## 2018-06-24 DIAGNOSIS — M7989 Other specified soft tissue disorders: Secondary | ICD-10-CM | POA: Diagnosis not present

## 2018-06-24 DIAGNOSIS — M79605 Pain in left leg: Secondary | ICD-10-CM | POA: Insufficient documentation

## 2018-06-24 DIAGNOSIS — R739 Hyperglycemia, unspecified: Secondary | ICD-10-CM | POA: Diagnosis not present

## 2018-06-24 NOTE — Assessment & Plan Note (Signed)
stable overall by history and exam, recent data reviewed with pt, and pt to continue medical treatment as before,  to f/u any worsening symptoms or concerns  

## 2018-06-24 NOTE — Progress Notes (Signed)
Patient ID: Betty Green, female   DOB: 09/02/1945, 73 y.o.   MRN: [id number]  Virtual Visit via Video Note  I connected with Betty Green on 06/24/18 at  7:40 PM EDT by a video enabled telemedicine application and verified that I am speaking with the correct person using two identifiers.  Location: Patient: at home with sister present Provider: at home   I discussed the limitations of evaluation and management by telemedicine and the availability of in person appointments. The patient expressed understanding and agreed to proceed.  History of Present Illness: 73yoF here with 1 wk onset unusual left thigh swelling, numbness, tingling, throbbing pain without skin redness, trauma, fever or groin pain or swelling, some worse to lie on left side at night but not really tender to touch over the greater trochanter; no falls but does walk a bit off kilter.  No LBP and no swelling below the knee.  Due to see ortho for left knee cortisone tomorrow at 10 am.  No prior hx of DVT Pt denies chest pain, increased sob or doe, wheezing, orthopnea, PND, increased LE swelling, palpitations, dizziness or syncope.  Pt denies new neurological symptoms such as new headache, or facial or extremity weakness or numbness   Pt denies polydipsia, polyuria Past Medical History:  Diagnosis Date  . Allergy   . Anxiety    "take RX prn"  . Arthritis    "all over"  . Asthma   . Chronic bronchitis ([place])   . GERD (gastroesophageal reflux disease)   . Hypertension   . Insomnia 05/31/2011  . OSA on CPAP    wears CPAP sometimes (06/23/2014)  . Other and unspecified hyperlipidemia 08/04/2012  . PONV (postoperative nausea and vomiting)   . Shortness of breath dyspnea    with exertion  . Shoulder pain, bilateral 10/18/2010  . Vitamin D deficiency 12/21/2015   Past Surgical History:  Procedure Laterality Date  . APPENDECTOMY  1966  . BREAST EXCISIONAL BIOPSY Right   . CHOLECYSTECTOMY OPEN  1981  . FRACTURE SURGERY    .  ORIF ANKLE FRACTURE Right 06/23/2014  . ORIF ANKLE FRACTURE Right 06/23/2014   Procedure: Take Down Malunion Right Ankle, Open Reduction Internal Fixation Right Ankle, Possible Syndesmosis Repair, Medial Joint Debridement;  Surgeon: Newt Minion, MD;  Location: [facility];  Service: Orthopedics;  Laterality: Right;  . TONSILLECTOMY  1963  . TOTAL ABDOMINAL HYSTERECTOMY  1980   w/BSO    reports that she quit smoking about 34 years ago. Her smoking use included cigarettes. She has a 0.20 pack-year smoking history. She has never used smokeless tobacco. She reports that she does not drink alcohol or use drugs. family history includes Arthritis in her father, mother, and another family member; Cancer in an other family member; Heart disease in her father and mother; Hypertension in her father and mother. Allergies  Allergen Reactions  . Amoxicillin-Pot Clavulanate     Hives  . Azithromycin Other (See Comments)    abd pain  . Lipitor [Atorvastatin] Itching   Current Outpatient Medications on File Prior to Visit  Medication Sig Dispense Refill  . alendronate (FOSAMAX) 70 MG tablet TAKE 1 TABLET (70 MG TOTAL) BY MOUTH EVERY 7 (SEVEN) DAYS. TAKE WITH A FULL GLASS OF WATER ON AN EMPTY STOMACH. 12 tablet 3  . fesoterodine (TOVIAZ) 4 MG TB24 tablet Take 1 tablet (4 mg total) by mouth daily. 90 tablet 3  . furosemide (LASIX) 80 MG tablet Take 1 tablet (80  mg total) by mouth 2 (two) times daily. 180 tablet 3  . hydrOXYzine (ATARAX/VISTARIL) 25 MG tablet TAKE 1-2 TABS BY MOUTH THREE TIMES PER DAY AS NEEDED FOR ITCHING 540 tablet 1  . meloxicam (MOBIC) 15 MG tablet TAKE 1 TABLET (15 MG TOTAL) BY MOUTH DAILY. WITH FOOD 90 tablet 1  . naproxen (NAPROSYN) 500 MG tablet Take 1 tablet (500 mg total) by mouth 2 (two) times daily as needed. 60 tablet 1  . traMADol (ULTRAM) 50 MG tablet TAKE 1 TABLET (50 MG TOTAL) BY MOUTH 4 (FOUR) TIMES DAILY AS NEEDED. 120 tablet 2   No current facility-administered medications on  file prior to visit.      Observations/Objective: Alert, NAD, appropriate mood and affect, resps normal, cn 2-12 intact, moves all 4s, no visible rash but has diffuse swelling at least 1+ left upper leg without redness or drainage; right thigh neg for swelling Lab Results  Component Value Date   WBC 6.7 10/14/2017   HGB 14.3 10/14/2017   HCT 43.8 10/14/2017   PLT 297.0 10/14/2017   GLUCOSE 104 (H) 10/14/2017   CHOL 221 (H) 10/14/2017   TRIG 88.0 10/14/2017   HDL 76.90 10/14/2017   LDLDIRECT 134.1 05/02/2012   LDLCALC 127 (H) 10/14/2017   ALT 13 10/14/2017   AST 12 10/14/2017   NA 138 10/14/2017   K 4.3 10/14/2017   CL 102 10/14/2017   CREATININE 1.00 10/14/2017   BUN 19 10/14/2017   CO2 30 10/14/2017   TSH 1.08 10/14/2017   INR 1.05 06/22/2014   HGBA1C 6.0 10/14/2017   Assessment and Plan: See notes  Follow Up Instructions: See notes  I discussed the assessment and treatment plan with the patient. The patient was provided an opportunity to ask questions and all were answered. The patient agreed with the plan and demonstrated an understanding of the instructions.   The patient was advised to call back or seek an in-person evaluation if the symptoms worsen or if the condition fails to improve as anticipated.   Cathlean Cower, MD

## 2018-06-24 NOTE — Patient Instructions (Signed)
Please continue all other medications as before, and refills have been done if requested.  Please have the pharmacy call with any other refills you may need.  Please continue your efforts at being more active, low cholesterol diet, and weight control.  You are otherwise up to date with prevention measures today.  Please keep your appointments with your specialists as you may have planned  You will be contacted regarding the referral for: leg vein circulation testing

## 2018-06-24 NOTE — Assessment & Plan Note (Signed)
Mild to mod, cant r/o DVT, [facility] ED visit tonight, for LE venous dopplers in am, to f/u any worsening symptoms or concerns

## 2018-06-25 ENCOUNTER — Ambulatory Visit: Payer: Medicare HMO | Admitting: Internal Medicine

## 2018-06-25 ENCOUNTER — Other Ambulatory Visit: Payer: Self-pay

## 2018-06-25 DIAGNOSIS — M25512 Pain in left shoulder: Secondary | ICD-10-CM | POA: Diagnosis not present

## 2018-06-25 DIAGNOSIS — M25762 Osteophyte, left knee: Secondary | ICD-10-CM | POA: Diagnosis not present

## 2018-06-25 DIAGNOSIS — M1712 Unilateral primary osteoarthritis, left knee: Secondary | ICD-10-CM | POA: Diagnosis not present

## 2018-06-25 DIAGNOSIS — M25562 Pain in left knee: Secondary | ICD-10-CM | POA: Diagnosis not present

## 2018-06-26 ENCOUNTER — Telehealth: Payer: Self-pay | Admitting: Internal Medicine

## 2018-06-26 ENCOUNTER — Encounter (HOSPITAL_COMMUNITY): Payer: Self-pay

## 2018-06-26 ENCOUNTER — Ambulatory Visit (HOSPITAL_COMMUNITY)
Admission: RE | Admit: 2018-06-26 | Discharge: 2018-06-26 | Disposition: A | Discharge status: Home / Self Care | Payer: Medicare HMO | Source: Ambulatory Visit | Attending: Cardiology | Admitting: Cardiology

## 2018-06-26 ENCOUNTER — Other Ambulatory Visit: Payer: Self-pay

## 2018-06-26 DIAGNOSIS — R2689 Other abnormalities of gait and mobility: Secondary | ICD-10-CM | POA: Diagnosis not present

## 2018-06-26 DIAGNOSIS — M222X1 Patellofemoral disorders, right knee: Secondary | ICD-10-CM | POA: Diagnosis not present

## 2018-06-26 DIAGNOSIS — M79662 Pain in left lower leg: Secondary | ICD-10-CM | POA: Diagnosis not present

## 2018-06-26 DIAGNOSIS — M25761 Osteophyte, right knee: Secondary | ICD-10-CM | POA: Diagnosis not present

## 2018-06-26 DIAGNOSIS — M7989 Other specified soft tissue disorders: Secondary | ICD-10-CM | POA: Insufficient documentation

## 2018-06-26 DIAGNOSIS — M25561 Pain in right knee: Secondary | ICD-10-CM | POA: Diagnosis not present

## 2018-06-26 DIAGNOSIS — M25511 Pain in right shoulder: Secondary | ICD-10-CM | POA: Diagnosis not present

## 2018-06-26 DIAGNOSIS — M1711 Unilateral primary osteoarthritis, right knee: Secondary | ICD-10-CM | POA: Diagnosis not present

## 2018-06-26 NOTE — Progress Notes (Unsigned)
The left lower venous has been completed and is negative for DVT.  Preliminary results can be found under CV proc through chart review.  Cira Servant, RVT [facility]

## 2018-06-26 NOTE — Telephone Encounter (Signed)
Patient states she is still having leg pain.  She will keep appt tomorrow.  Also, states Dr. Jenny Reichmann was to have sent an antibiotic her her pharmacy.  States her pharmacy has not received anything yet.

## 2018-06-26 NOTE — Telephone Encounter (Signed)
No, we actually did not mean to do an antibiotic, but we will check tomorrow if this is needed

## 2018-06-27 ENCOUNTER — Encounter: Payer: Self-pay | Admitting: Internal Medicine

## 2018-06-27 ENCOUNTER — Ambulatory Visit: Payer: Self-pay

## 2018-06-27 ENCOUNTER — Ambulatory Visit (INDEPENDENT_AMBULATORY_CARE_PROVIDER_SITE_OTHER): Payer: Medicare HMO | Admitting: Internal Medicine

## 2018-06-27 ENCOUNTER — Ambulatory Visit (INDEPENDENT_AMBULATORY_CARE_PROVIDER_SITE_OTHER): Payer: Medicare HMO | Admitting: Family Medicine

## 2018-06-27 ENCOUNTER — Encounter: Payer: Self-pay | Admitting: Family Medicine

## 2018-06-27 VITALS — BP 126/82 | HR 105 | Resp 16 | Ht 62.0 in | Wt 294.0 lb

## 2018-06-27 VITALS — BP 126/82 | HR 105 | Temp 98.1°F | Ht 62.0 in | Wt 294.0 lb

## 2018-06-27 DIAGNOSIS — M7062 Trochanteric bursitis, left hip: Secondary | ICD-10-CM

## 2018-06-27 DIAGNOSIS — M79605 Pain in left leg: Secondary | ICD-10-CM

## 2018-06-27 DIAGNOSIS — R739 Hyperglycemia, unspecified: Secondary | ICD-10-CM

## 2018-06-27 DIAGNOSIS — J019 Acute sinusitis, unspecified: Secondary | ICD-10-CM

## 2018-06-27 DIAGNOSIS — I10 Essential (primary) hypertension: Secondary | ICD-10-CM

## 2018-06-27 MED ORDER — DOXYCYCLINE HYCLATE 100 MG PO TABS
100.0000 mg | ORAL_TABLET | Freq: Two times a day (BID) | ORAL | 0 refills | Status: DC
Start: 2018-06-27 — End: 2018-09-10

## 2018-06-27 NOTE — Assessment & Plan Note (Signed)
Pain seems to be more associated with a bursitis.  Could be radicular nature.  She does have significant knee pain so could be an imbalance leading to her walking differently. -Greater trochanteric bursa injection today. -Counseled on her exercise therapy and supportive care. -If no improvement will consider imaging and treating for radiculopathy.

## 2018-06-27 NOTE — Progress Notes (Signed)
Betty Green - 73 y.o. female MRN [id number]  Date of birth: 1945/04/08  SUBJECTIVE:  Including CC & ROS.  No chief complaint on file.   Betty Green is a 73 y.o. female that is presenting with left hip pain.  This is been acutely painful over the past few weeks.  Denies any inciting event.  Has not tried any home modalities or medications.  Denies any inciting event or trauma.  The pain is sharp and stabbing.  Is worse if she walks or lies on the affected side.  Does not feel like sciatica.  Pain can range from moderate to severe.  It is localized to the lateral hip with some radiation distally towards the knee.    Review of Systems  Constitutional: Negative for fever.  HENT: Negative for congestion.   Respiratory: Negative for cough.   Cardiovascular: Negative for chest pain.  Gastrointestinal: Negative for abdominal pain.  Musculoskeletal: Positive for arthralgias, gait problem and joint swelling.  Skin: Negative for color change.  Neurological: Negative for weakness.  Hematological: Negative for adenopathy.    HISTORY: Past Medical, Surgical, Social, and Family History Reviewed & Updated per EMR.   Pertinent Historical Findings include:  Past Medical History:  Diagnosis Date  . Allergy   . Anxiety    "take RX prn"  . Arthritis    "all over"  . Asthma   . Chronic bronchitis ([place])   . GERD (gastroesophageal reflux disease)   . Hypertension   . Insomnia 05/31/2011  . OSA on CPAP    wears CPAP sometimes (06/23/2014)  . Other and unspecified hyperlipidemia 08/04/2012  . PONV (postoperative nausea and vomiting)   . Shortness of breath dyspnea    with exertion  . Shoulder pain, bilateral 10/18/2010  . Vitamin D deficiency 12/21/2015    Past Surgical History:  Procedure Laterality Date  . APPENDECTOMY  1966  . BREAST EXCISIONAL BIOPSY Right   . CHOLECYSTECTOMY OPEN  1981  . FRACTURE SURGERY    . ORIF ANKLE FRACTURE Right 06/23/2014  . ORIF ANKLE FRACTURE Right 06/23/2014    Procedure: Take Down Malunion Right Ankle, Open Reduction Internal Fixation Right Ankle, Possible Syndesmosis Repair, Medial Joint Debridement;  Surgeon: Newt Minion, MD;  Location: [facility];  Service: Orthopedics;  Laterality: Right;  . TONSILLECTOMY  1963  . TOTAL ABDOMINAL HYSTERECTOMY  1980   w/BSO    Allergies  Allergen Reactions  . Amoxicillin-Pot Clavulanate     Hives  . Azithromycin Other (See Comments)    abd pain  . Lipitor [Atorvastatin] Itching    Family History  Problem Relation Age of Onset  . Arthritis Other   . Arthritis Mother   . Heart disease Mother   . Hypertension Mother   . Arthritis Father   . Heart disease Father   . Hypertension Father   . Cancer Other        colon and prostate cancer     Social History   Socioeconomic History  . Marital status: Single    Spouse name: Not on file  . Number of children: 2  . Years of education: 59  . Highest education level: Not on file  Occupational History  . Occupation: retired    Fish farm manager: [employer]  . Financial resource strain: Not on file  . Food insecurity:    Worry: Not on file    Inability: Not on file  . Transportation needs:    Medical: Not on  file    Non-medical: Not on file  Tobacco Use  . Smoking status: Former Smoker    Packs/day: 0.10    Years: 2.00    Pack years: 0.20    Types: Cigarettes    Last attempt to quit: 02/06/1984    Years since quitting: 34.4  . Smokeless tobacco: Never Used  Substance and Sexual Activity  . Alcohol use: No  . Drug use: No  . Sexual activity: Not Currently  Lifestyle  . Physical activity:    Days per week: Not on file    Minutes per session: Not on file  . Stress: Not on file  Relationships  . Social connections:    Talks on phone: Not on file    Gets together: Not on file    Attends religious service: Not on file    Active member of club or organization: Not on file    Attends meetings of clubs or organizations: Not  on file    Relationship status: Not on file  . Intimate partner violence:    Fear of current or ex partner: Not on file    Emotionally abused: Not on file    Physically abused: Not on file    Forced sexual activity: Not on file  Other Topics Concern  . Not on file  Social History Narrative  . Not on file     PHYSICAL EXAM:  VS: BP 126/82   Pulse (!) 105   Resp 16   Ht 5\' 2"  (1.575 m)   Wt 294 lb (133.4 kg)   SpO2 96%   BMI 53.77 kg/m  Physical Exam Gen: NAD, alert, cooperative with exam, well-appearing ENT: normal lips, normal nasal mucosa,  Eye: normal EOM, normal conjunctiva and lids CV:  no edema, +2 pedal pulses   Resp: no accessory muscle use, non-labored,  Skin: no rashes, no areas of induration  Neuro: normal tone, normal sensation to touch Psych:  normal insight, alert and oriented MSK:  Left hip: Tenderness to palpation of the greater trochanter. Normal internal and external rotation. Normal strength resistance with hip flexion, knee extension and flexion, plantarflexion and dorsiflexion. Negative straight leg raise. Neurovascular intact   Aspiration/Injection Procedure Note Betty Green Dec 22, 1945  Procedure: Injection Indications: Left hip pain  Procedure Details Consent: Risks of procedure as well as the alternatives and risks of each were explained to the (patient/caregiver).  Consent for procedure obtained. Time Out: Verified patient identification, verified procedure, site/side was marked, verified correct patient position, special equipment/implants available, medications/allergies/relevent history reviewed, required imaging and test results available.  Performed.  The area was cleaned with iodine and alcohol swabs.    The left greater trochanteric bursa was injected using 1 cc's of 40 mg Depo-Medrol and 4 cc's of 0.5 % bupivacaine with a 22 3 1/2" needle.  Ultrasound was used. Images were obtained in trans views showing the injection.     A  sterile dressing was applied.  Patient did tolerate procedure well.       ASSESSMENT & PLAN:   Greater trochanteric bursitis, left Pain seems to be more associated with a bursitis.  Could be radicular nature.  She does have significant knee pain so could be an imbalance leading to her walking differently. -Greater trochanteric bursa injection today. -Counseled on her exercise therapy and supportive care. -If no improvement will consider imaging and treating for radiculopathy.

## 2018-06-27 NOTE — Patient Instructions (Signed)
Good to see you  Please try the exercises if no pain  Please send me a message in MyChart with any questions or updates.  Please see me back in 4 weeks if no better.   --Dr. Raeford Razor

## 2018-06-27 NOTE — Progress Notes (Signed)
Subjective:    Patient ID: Betty Green, female    DOB: 1945/08/09, 73 y.o.   MRN: [id number]  HPI   Here with 2-3 days acute onset fever, facial pain, pressure, headache, general weakness and malaise, and greenish d/c, with mild ST and cough, but pt denies chest pain, wheezing, increased sob or doe, orthopnea, PND, increased LE swelling, palpitations, dizziness or syncope.  Also c/o persistent pain and swelling to left upper thigh to the area just proximal to the knee anteriorly and lateral leg up to greater trochanter area  Pt denies new neurological symptoms such as new headache, or facial or extremity weakness or numbness   Pt denies polydipsia, polyuria.  Did not start the antibiotic yet.  LLE venous doppler neg yesterday Past Medical History:  Diagnosis Date  . Allergy   . Anxiety    "take RX prn"  . Arthritis    "all over"  . Asthma   . Chronic bronchitis ([place])   . GERD (gastroesophageal reflux disease)   . Hypertension   . Insomnia 05/31/2011  . OSA on CPAP    wears CPAP sometimes (06/23/2014)  . Other and unspecified hyperlipidemia 08/04/2012  . PONV (postoperative nausea and vomiting)   . Shortness of breath dyspnea    with exertion  . Shoulder pain, bilateral 10/18/2010  . Vitamin D deficiency 12/21/2015   Past Surgical History:  Procedure Laterality Date  . APPENDECTOMY  1966  . BREAST EXCISIONAL BIOPSY Right   . CHOLECYSTECTOMY OPEN  1981  . FRACTURE SURGERY    . ORIF ANKLE FRACTURE Right 06/23/2014  . ORIF ANKLE FRACTURE Right 06/23/2014   Procedure: Take Down Malunion Right Ankle, Open Reduction Internal Fixation Right Ankle, Possible Syndesmosis Repair, Medial Joint Debridement;  Surgeon: Newt Minion, MD;  Location: [facility];  Service: Orthopedics;  Laterality: Right;  . TONSILLECTOMY  1963  . TOTAL ABDOMINAL HYSTERECTOMY  1980   w/BSO    reports that she quit smoking about 34 years ago. Her smoking use included cigarettes. She has a 0.20 pack-year smoking history.  She has never used smokeless tobacco. She reports that she does not drink alcohol or use drugs. family history includes Arthritis in her father, mother, and another family member; Cancer in an other family member; Heart disease in her father and mother; Hypertension in her father and mother. Allergies  Allergen Reactions  . Amoxicillin-Pot Clavulanate     Hives  . Azithromycin Other (See Comments)    abd pain  . Lipitor [Atorvastatin] Itching   Current Outpatient Medications on File Prior to Visit  Medication Sig Dispense Refill  . alendronate (FOSAMAX) 70 MG tablet TAKE 1 TABLET (70 MG TOTAL) BY MOUTH EVERY 7 (SEVEN) DAYS. TAKE WITH A FULL GLASS OF WATER ON AN EMPTY STOMACH. 12 tablet 3  . fesoterodine (TOVIAZ) 4 MG TB24 tablet Take 1 tablet (4 mg total) by mouth daily. 90 tablet 3  . furosemide (LASIX) 80 MG tablet Take 1 tablet (80 mg total) by mouth 2 (two) times daily. 180 tablet 3  . hydrOXYzine (ATARAX/VISTARIL) 25 MG tablet TAKE 1-2 TABS BY MOUTH THREE TIMES PER DAY AS NEEDED FOR ITCHING 540 tablet 1  . meloxicam (MOBIC) 15 MG tablet TAKE 1 TABLET (15 MG TOTAL) BY MOUTH DAILY. WITH FOOD 90 tablet 1  . naproxen (NAPROSYN) 500 MG tablet Take 1 tablet (500 mg total) by mouth 2 (two) times daily as needed. 60 tablet 1  . traMADol (ULTRAM) 50 MG tablet TAKE  1 TABLET (50 MG TOTAL) BY MOUTH 4 (FOUR) TIMES DAILY AS NEEDED. 120 tablet 2   No current facility-administered medications on file prior to visit.    Review of Systems  Constitutional: Negative for other unusual diaphoresis or sweats HENT: Negative for ear discharge or swelling Eyes: Negative for other worsening visual disturbances Respiratory: Negative for stridor or other swelling  Gastrointestinal: Negative for worsening distension or other blood Genitourinary: Negative for retention or other urinary change Musculoskeletal: Negative for other MSK pain or swelling Skin: Negative for color change or other new lesions  Neurological: Negative for worsening tremors and other numbness  Psychiatric/Behavioral: Negative for worsening agitation or other fatigue All other system neg per pt    Objective:   Physical Exam BP 126/82   Pulse (!) 105   Temp 98.1 F (36.7 C) (Oral)   Ht 5\' 2"  (1.575 m)   Wt 294 lb (133.4 kg)   SpO2 96%   BMI 53.77 kg/m  VS noted, mild ill Constitutional: Pt appears in NAD HENT: Head: NCAT.  Right Ear: External ear normal.  Left Ear: External ear normal.  Bilat tm's with mild erythema.  Max sinus areas mild tender.  Pharynx with mild erythema, no exudate Eyes: . Pupils are equal, round, and reactive to light. Conjunctivae and EOM are normal Nose: without d/c or deformity Neck: Neck supple. Gross normal ROM Cardiovascular: Normal rate and regular rhythm.   Pulmonary/Chest: Effort normal and breath sounds without rales or wheezing.  Left thigh with tender mild swelling and tender over left greater trochanter Neurological: Pt is alert. At baseline orientation, motor grossly intact Skin: Skin is warm. No rashes, other new lesions, no LE edema Psychiatric: Pt behavior is normal without agitation  No other exam findings Lab Results  Component Value Date   WBC 6.7 10/14/2017   HGB 14.3 10/14/2017   HCT 43.8 10/14/2017   PLT 297.0 10/14/2017   GLUCOSE 104 (H) 10/14/2017   CHOL 221 (H) 10/14/2017   TRIG 88.0 10/14/2017   HDL 76.90 10/14/2017   LDLDIRECT 134.1 05/02/2012   LDLCALC 127 (H) 10/14/2017   ALT 13 10/14/2017   AST 12 10/14/2017   NA 138 10/14/2017   K 4.3 10/14/2017   CL 102 10/14/2017   CREATININE 1.00 10/14/2017   BUN 19 10/14/2017   CO2 30 10/14/2017   TSH 1.08 10/14/2017   INR 1.05 06/22/2014   HGBA1C 6.0 10/14/2017      Assessment & Plan:

## 2018-06-27 NOTE — Patient Instructions (Addendum)
Please take all new medication as prescribed - the antibiotic  Please continue all other medications as before, and refills have been done if requested.  Please have the pharmacy call with any other refills you may need.  Please continue your efforts at being more active, low cholesterol diet, and weight control.  Please keep your appointments with your specialists as you may have planned  We have you set up for seeing Dr Raeford Razor directly after this visit

## 2018-06-28 ENCOUNTER — Encounter: Payer: Self-pay | Admitting: Internal Medicine

## 2018-06-28 DIAGNOSIS — J019 Acute sinusitis, unspecified: Secondary | ICD-10-CM | POA: Insufficient documentation

## 2018-06-28 NOTE — Assessment & Plan Note (Signed)
Mild to mod, for antibx course,  to f/u any worsening symptoms or concerns 

## 2018-06-28 NOTE — Assessment & Plan Note (Signed)
Venous doppler neg, most c/w probable left hip bursitis, for pain control,, refer sports medicine today

## 2018-07-02 ENCOUNTER — Encounter (HOSPITAL_COMMUNITY): Payer: Medicare HMO

## 2018-07-02 DIAGNOSIS — M25562 Pain in left knee: Secondary | ICD-10-CM | POA: Diagnosis not present

## 2018-07-02 DIAGNOSIS — M25512 Pain in left shoulder: Secondary | ICD-10-CM | POA: Diagnosis not present

## 2018-07-02 DIAGNOSIS — M25762 Osteophyte, left knee: Secondary | ICD-10-CM | POA: Diagnosis not present

## 2018-07-02 DIAGNOSIS — M1712 Unilateral primary osteoarthritis, left knee: Secondary | ICD-10-CM | POA: Diagnosis not present

## 2018-07-03 DIAGNOSIS — M25561 Pain in right knee: Secondary | ICD-10-CM | POA: Diagnosis not present

## 2018-07-03 DIAGNOSIS — M25761 Osteophyte, right knee: Secondary | ICD-10-CM | POA: Diagnosis not present

## 2018-07-03 DIAGNOSIS — R2689 Other abnormalities of gait and mobility: Secondary | ICD-10-CM | POA: Diagnosis not present

## 2018-07-03 DIAGNOSIS — M25511 Pain in right shoulder: Secondary | ICD-10-CM | POA: Diagnosis not present

## 2018-07-03 DIAGNOSIS — M1711 Unilateral primary osteoarthritis, right knee: Secondary | ICD-10-CM | POA: Diagnosis not present

## 2018-07-03 DIAGNOSIS — M222X1 Patellofemoral disorders, right knee: Secondary | ICD-10-CM | POA: Diagnosis not present

## 2018-07-08 ENCOUNTER — Other Ambulatory Visit: Payer: Self-pay | Admitting: Family Medicine

## 2018-07-09 DIAGNOSIS — M25512 Pain in left shoulder: Secondary | ICD-10-CM | POA: Diagnosis not present

## 2018-07-09 DIAGNOSIS — M1712 Unilateral primary osteoarthritis, left knee: Secondary | ICD-10-CM | POA: Diagnosis not present

## 2018-07-09 DIAGNOSIS — M25762 Osteophyte, left knee: Secondary | ICD-10-CM | POA: Diagnosis not present

## 2018-07-09 DIAGNOSIS — M25562 Pain in left knee: Secondary | ICD-10-CM | POA: Diagnosis not present

## 2018-07-10 DIAGNOSIS — M1711 Unilateral primary osteoarthritis, right knee: Secondary | ICD-10-CM | POA: Diagnosis not present

## 2018-07-10 DIAGNOSIS — M222X1 Patellofemoral disorders, right knee: Secondary | ICD-10-CM | POA: Diagnosis not present

## 2018-07-10 DIAGNOSIS — M25761 Osteophyte, right knee: Secondary | ICD-10-CM | POA: Diagnosis not present

## 2018-07-10 DIAGNOSIS — M25511 Pain in right shoulder: Secondary | ICD-10-CM | POA: Diagnosis not present

## 2018-07-10 DIAGNOSIS — M25561 Pain in right knee: Secondary | ICD-10-CM | POA: Diagnosis not present

## 2018-07-11 ENCOUNTER — Encounter: Payer: Self-pay | Admitting: Internal Medicine

## 2018-07-16 DIAGNOSIS — M1712 Unilateral primary osteoarthritis, left knee: Secondary | ICD-10-CM | POA: Diagnosis not present

## 2018-07-16 DIAGNOSIS — M25562 Pain in left knee: Secondary | ICD-10-CM | POA: Diagnosis not present

## 2018-07-16 DIAGNOSIS — M25762 Osteophyte, left knee: Secondary | ICD-10-CM | POA: Diagnosis not present

## 2018-07-16 DIAGNOSIS — R2689 Other abnormalities of gait and mobility: Secondary | ICD-10-CM | POA: Diagnosis not present

## 2018-07-16 DIAGNOSIS — M222X2 Patellofemoral disorders, left knee: Secondary | ICD-10-CM | POA: Diagnosis not present

## 2018-07-17 ENCOUNTER — Other Ambulatory Visit: Payer: Self-pay | Admitting: Internal Medicine

## 2018-07-17 DIAGNOSIS — M1711 Unilateral primary osteoarthritis, right knee: Secondary | ICD-10-CM | POA: Diagnosis not present

## 2018-07-17 DIAGNOSIS — M25761 Osteophyte, right knee: Secondary | ICD-10-CM | POA: Diagnosis not present

## 2018-07-17 DIAGNOSIS — R2689 Other abnormalities of gait and mobility: Secondary | ICD-10-CM | POA: Diagnosis not present

## 2018-07-17 DIAGNOSIS — Z1231 Encounter for screening mammogram for malignant neoplasm of breast: Secondary | ICD-10-CM

## 2018-07-17 DIAGNOSIS — M25561 Pain in right knee: Secondary | ICD-10-CM | POA: Diagnosis not present

## 2018-07-17 DIAGNOSIS — M222X1 Patellofemoral disorders, right knee: Secondary | ICD-10-CM | POA: Diagnosis not present

## 2018-07-18 DIAGNOSIS — G4733 Obstructive sleep apnea (adult) (pediatric): Secondary | ICD-10-CM | POA: Diagnosis not present

## 2018-07-23 DIAGNOSIS — M1712 Unilateral primary osteoarthritis, left knee: Secondary | ICD-10-CM | POA: Diagnosis not present

## 2018-07-23 DIAGNOSIS — M25562 Pain in left knee: Secondary | ICD-10-CM | POA: Diagnosis not present

## 2018-07-23 DIAGNOSIS — R2689 Other abnormalities of gait and mobility: Secondary | ICD-10-CM | POA: Diagnosis not present

## 2018-07-23 DIAGNOSIS — M25762 Osteophyte, left knee: Secondary | ICD-10-CM | POA: Diagnosis not present

## 2018-07-24 ENCOUNTER — Ambulatory Visit: Payer: Medicare HMO | Admitting: Internal Medicine

## 2018-07-24 DIAGNOSIS — M25761 Osteophyte, right knee: Secondary | ICD-10-CM | POA: Diagnosis not present

## 2018-07-24 DIAGNOSIS — M1711 Unilateral primary osteoarthritis, right knee: Secondary | ICD-10-CM | POA: Diagnosis not present

## 2018-07-24 DIAGNOSIS — M222X1 Patellofemoral disorders, right knee: Secondary | ICD-10-CM | POA: Diagnosis not present

## 2018-07-24 DIAGNOSIS — M25561 Pain in right knee: Secondary | ICD-10-CM | POA: Diagnosis not present

## 2018-07-24 DIAGNOSIS — R2689 Other abnormalities of gait and mobility: Secondary | ICD-10-CM | POA: Diagnosis not present

## 2018-08-06 ENCOUNTER — Ambulatory Visit: Payer: Medicare HMO

## 2018-09-08 ENCOUNTER — Encounter: Payer: Self-pay | Admitting: Internal Medicine

## 2018-09-10 ENCOUNTER — Other Ambulatory Visit: Payer: Self-pay

## 2018-09-10 ENCOUNTER — Ambulatory Visit (INDEPENDENT_AMBULATORY_CARE_PROVIDER_SITE_OTHER): Payer: Medicare HMO | Admitting: Internal Medicine

## 2018-09-10 ENCOUNTER — Encounter: Payer: Self-pay | Admitting: Internal Medicine

## 2018-09-10 VITALS — BP 140/88 | HR 122 | Temp 98.3°F | Ht 62.0 in | Wt 289.0 lb

## 2018-09-10 DIAGNOSIS — F329 Major depressive disorder, single episode, unspecified: Secondary | ICD-10-CM | POA: Diagnosis not present

## 2018-09-10 DIAGNOSIS — R251 Tremor, unspecified: Secondary | ICD-10-CM | POA: Diagnosis not present

## 2018-09-10 DIAGNOSIS — E611 Iron deficiency: Secondary | ICD-10-CM

## 2018-09-10 DIAGNOSIS — F32A Depression, unspecified: Secondary | ICD-10-CM

## 2018-09-10 DIAGNOSIS — R739 Hyperglycemia, unspecified: Secondary | ICD-10-CM

## 2018-09-10 DIAGNOSIS — E559 Vitamin D deficiency, unspecified: Secondary | ICD-10-CM

## 2018-09-10 DIAGNOSIS — Z0001 Encounter for general adult medical examination with abnormal findings: Secondary | ICD-10-CM

## 2018-09-10 DIAGNOSIS — J302 Other seasonal allergic rhinitis: Secondary | ICD-10-CM | POA: Diagnosis not present

## 2018-09-10 DIAGNOSIS — R519 Headache, unspecified: Secondary | ICD-10-CM

## 2018-09-10 DIAGNOSIS — E538 Deficiency of other specified B group vitamins: Secondary | ICD-10-CM | POA: Diagnosis not present

## 2018-09-10 DIAGNOSIS — R51 Headache: Secondary | ICD-10-CM | POA: Diagnosis not present

## 2018-09-10 DIAGNOSIS — J3089 Other allergic rhinitis: Secondary | ICD-10-CM

## 2018-09-10 DIAGNOSIS — R69 Illness, unspecified: Secondary | ICD-10-CM | POA: Diagnosis not present

## 2018-09-10 MED ORDER — CITALOPRAM HYDROBROMIDE 10 MG PO TABS: 10.0000 mg | ORAL_TABLET | Freq: Every day | ORAL | 3 refills | Status: DC

## 2018-09-10 MED ORDER — TRIAMCINOLONE ACETONIDE 55 MCG/ACT NA AERO: 2.0000 | INHALATION_SPRAY | Freq: Every day | NASAL | 12 refills | Status: DC

## 2018-09-10 MED ORDER — TIZANIDINE HCL 2 MG PO TABS: 2.0000 mg | ORAL_TABLET | Freq: Four times a day (QID) | ORAL | 1 refills | Status: DC | PRN

## 2018-09-10 MED ORDER — CETIRIZINE HCL 10 MG PO TABS: 10.0000 mg | ORAL_TABLET | Freq: Every day | ORAL | 11 refills | Status: DC

## 2018-09-10 NOTE — Assessment & Plan Note (Signed)
With anxiety, for celexa 10 qd, declines counseling referral

## 2018-09-10 NOTE — Assessment & Plan Note (Signed)
Also for f/u lab,  to f/u any worsening symptoms or concerns 

## 2018-09-10 NOTE — Assessment & Plan Note (Signed)
stable overall by history and exam, recent data reviewed with pt, and pt to continue medical treatment as before,  to f/u any worsening symptoms or concerns  

## 2018-09-10 NOTE — Assessment & Plan Note (Signed)
Mild to mod, for zyrtec and nasacort asd,  to f/u any worsening symptoms or concerns 

## 2018-09-10 NOTE — Assessment & Plan Note (Signed)
C/w probable tension type - for muscle relaxer prn,  to f/u any worsening symptoms or concerns

## 2018-09-10 NOTE — Patient Instructions (Signed)
Please take all new medication as prescribed - the celexa 10 mg for depression and nerves, muscle relaxer as needed for the headaches, and zyrtec and nasacort for allergies  Please continue all other medications as before, and refills have been done if requested.  Please have the pharmacy call with any other refills you may need.  Please continue your efforts at being more active, low cholesterol diet, and weight control.  You are otherwise up to date with prevention measures today.  Please keep your appointments with your specialists as you may have planned  Please go to the LAB in the Basement (turn left off the elevator) for the tests to be done today  You will be contacted by phone if any changes need to be made immediately.  Otherwise, you will receive a letter about your results with an explanation, but please check with MyChart first.  Please remember to sign up for MyChart if you have not done so, as this will be important to you in the future with finding out test results, communicating by private email, and scheduling acute appointments online when needed.  Please return in 6 months, or sooner if needed

## 2018-09-10 NOTE — Progress Notes (Signed)
Subjective:    Patient ID: Betty Green, female    DOB: [birth year], 73 y.o.   MRN: [id number]  HPI  Here for wellness and f/u;  Overall doing ok;  Pt denies Chest pain, worsening SOB, DOE, wheezing, orthopnea, PND, worsening LE edema, palpitations, dizziness or syncope.  Pt denies neurological change such as new headache, facial or extremity weakness.  Pt denies polydipsia, polyuria, or low sugar symptoms. Pt states overall good compliance with treatment and medications, good tolerability, and has been trying to follow appropriate diet.  Pt denies worsening depressive symptoms, suicidal ideation or panic. No fever, night sweats, wt loss, loss of appetite, or other constitutional symptoms.  Pt states good ability with ADL's, has low fall risk, home safety reviewed and adequate, no other significant changes in hearing or vision, and not active with exercise due to chronic bilateral knee pain getting worse, cannot really get up on the exam table today. Also with c/o bandlike HA from bilat forehead to top of the head, rubber band like tightness, can feel it when she blinks and moves the eyes., ongoing x 1 mo,  Tylenol not working, off and on, mild to mostly moderate, stress makes worse, but not bending or exercise does not make worse. No fever.  Has some soreness to touch at the top of the head and somewhat at the forehead level as well.   Also with worsening depression symptoms but no SI or HI, and occasional panic at home.  Declines counseling for now.   Not taking the fosamax as wary of side effects like bone pain. Also, Does have several wks ongoing nasal allergy symptoms with clearish congestion, itch and sneezing, without fever, pain, ST, cough, swelling or wheezing. Also with 1-2 mo very mild left hand tremor noticeable but no pain and not really getting in the way of doing her daily ADLs.   Past Medical History:  Diagnosis Date  . Allergy   . Anxiety    "take RX prn"  . Arthritis    "all over"   . Asthma   . Chronic bronchitis ([place])   . GERD (gastroesophageal reflux disease)   . Hypertension   . Insomnia 05/31/2011  . OSA on CPAP    wears CPAP sometimes (06/23/2014)  . Other and unspecified hyperlipidemia 08/04/2012  . PONV (postoperative nausea and vomiting)   . Shortness of breath dyspnea    with exertion  . Shoulder pain, bilateral 10/18/2010  . Vitamin D deficiency 12/21/2015   Past Surgical History:  Procedure Laterality Date  . APPENDECTOMY  1966  . BREAST EXCISIONAL BIOPSY Right   . CHOLECYSTECTOMY OPEN  1981  . FRACTURE SURGERY    . ORIF ANKLE FRACTURE Right 06/23/2014  . ORIF ANKLE FRACTURE Right 06/23/2014   Procedure: Take Down Malunion Right Ankle, Open Reduction Internal Fixation Right Ankle, Possible Syndesmosis Repair, Medial Joint Debridement;  Surgeon: Newt Minion, MD;  Location: [facility];  Service: Orthopedics;  Laterality: Right;  . TONSILLECTOMY  1963  . TOTAL ABDOMINAL HYSTERECTOMY  1980   w/BSO    reports that she quit smoking about 34 years ago. Her smoking use included cigarettes. She has a 0.20 pack-year smoking history. She has never used smokeless tobacco. She reports that she does not drink alcohol or use drugs. family history includes Arthritis in her father, mother, and another family member; Cancer in an other family member; Heart disease in her father and mother; Hypertension in her father and mother. Allergies  Allergen  Reactions  . Amoxicillin-Pot Clavulanate     Hives  . Azithromycin Other (See Comments)    abd pain  . Lipitor [Atorvastatin] Itching   Current Outpatient Medications on File Prior to Visit  Medication Sig Dispense Refill  . fesoterodine (TOVIAZ) 4 MG TB24 tablet Take 1 tablet (4 mg total) by mouth daily. 90 tablet 3  . furosemide (LASIX) 80 MG tablet Take 1 tablet (80 mg total) by mouth 2 (two) times daily. 180 tablet 3  . hydrOXYzine (ATARAX/VISTARIL) 25 MG tablet TAKE 1-2 TABS BY MOUTH THREE TIMES PER DAY AS NEEDED FOR  ITCHING 540 tablet 1  . naproxen (NAPROSYN) 500 MG tablet TAKE 1 TABLET BY MOUTH TWICE A DAY AS NEEDED 60 tablet 1  . traMADol (ULTRAM) 50 MG tablet TAKE 1 TABLET (50 MG TOTAL) BY MOUTH 4 (FOUR) TIMES DAILY AS NEEDED. 120 tablet 2   No current facility-administered medications on file prior to visit.    Review of Systems Constitutional: Negative for other unusual diaphoresis, sweats, appetite or weight changes HENT: Negative for other worsening hearing loss, ear pain, facial swelling, mouth sores or neck stiffness.   Eyes: Negative for other worsening pain, redness or other visual disturbance.  Respiratory: Negative for other stridor or swelling Cardiovascular: Negative for other palpitations or other chest pain  Gastrointestinal: Negative for worsening diarrhea or loose stools, blood in stool, distention or other pain Genitourinary: Negative for hematuria, flank pain or other change in urine volume.  Musculoskeletal: Negative for myalgias or other joint swelling.  Skin: Negative for other color change, or other wound or worsening drainage.  Neurological: Negative for other syncope or numbness. Hematological: Negative for other adenopathy or swelling Psychiatric/Behavioral: Negative for hallucinations, other worsening agitation, SI, self-injury, or new decreased concentration All other system neg per pt    Objective:   Physical Exam BP 140/88   Pulse (!) 122   Temp 98.3 F (36.8 C) (Oral)   Ht 5\' 2"  (1.575 m)   Wt 289 lb (131.1 kg)   SpO2 94%   BMI 52.86 kg/m  VS noted,  Constitutional: Pt is oriented to person, place, and time. Appears well-developed and well-nourished, in no significant distress and comfortable Head: Normocephalic and atraumatic  Eyes: Conjunctivae and EOM are normal. Pupils are equal, round, and reactive to light Bilat tm's with mild erythema.  Max sinus areas non tender.  Pharynx with mild erythema, no exudate Right Ear: External ear normal without discharge  Left Ear: External ear normal without discharge Nose: Nose without discharge or deformity Mouth/Throat: Oropharynx is without other ulcerations and moist  Neck: Normal range of motion. Neck supple. No JVD present. No tracheal deviation present or significant neck LA or mass Cardiovascular: Normal rate, regular rhythm, normal heart sounds and intact distal pulses.   Pulmonary/Chest: WOB normal and breath sounds without rales or wheezing  Abdominal: Soft. Bowel sounds are normal. NT. No HSM  Musculoskeletal: Normal range of motion. Exhibits no edema Lymphadenopathy: Has no other cervical adenopathy.  Neurological: Pt is alert and oriented to person, place, and time. Pt has normal reflexes. No cranial nerve deficit. Motor grossly intact, Gait very slow due to knee pain, + tremor very mild left hand Skin: Skin is warm and dry. No rash noted or new ulcerations Psychiatric:  Has anxiety depressed mood and affect. Behavior is normal without agitation No other exam findings Lab Results  Component Value Date   WBC 6.7 10/14/2017   HGB 14.3 10/14/2017   HCT 43.8 10/14/2017  PLT 297.0 10/14/2017   GLUCOSE 104 (H) 10/14/2017   CHOL 221 (H) 10/14/2017   TRIG 88.0 10/14/2017   HDL 76.90 10/14/2017   LDLDIRECT 134.1 05/02/2012   LDLCALC 127 (H) 10/14/2017   ALT 13 10/14/2017   AST 12 10/14/2017   NA 138 10/14/2017   K 4.3 10/14/2017   CL 102 10/14/2017   CREATININE 1.00 10/14/2017   BUN 19 10/14/2017   CO2 30 10/14/2017   TSH 1.08 10/14/2017   INR 1.05 06/22/2014   HGBA1C 6.0 10/14/2017        Assessment & Plan:

## 2018-09-10 NOTE — Assessment & Plan Note (Addendum)
D/w pt, very mild, will hold on further BB tx or neurology referral at this time  In addition to the time spent performing CPE, I spent an additional 40 minutes face to face,in which greater than 50% of this time was spent in counseling and coordination of care for patient's acute illness as documented, including the differential dx, treatment, further evaluation and other management of tremor, HA, allergies, depression, vit d deficiency, hyperglycemia

## 2018-09-10 NOTE — Assessment & Plan Note (Signed)

## 2018-09-21 ENCOUNTER — Other Ambulatory Visit: Payer: Self-pay | Admitting: Family Medicine

## 2018-09-22 ENCOUNTER — Other Ambulatory Visit: Payer: Self-pay | Admitting: Internal Medicine

## 2018-09-22 NOTE — Telephone Encounter (Signed)
Done erx 

## 2018-10-02 DIAGNOSIS — G4733 Obstructive sleep apnea (adult) (pediatric): Secondary | ICD-10-CM | POA: Diagnosis not present

## 2018-10-08 ENCOUNTER — Other Ambulatory Visit: Payer: Self-pay

## 2018-10-08 DIAGNOSIS — Z20822 Contact with and (suspected) exposure to covid-19: Secondary | ICD-10-CM

## 2018-10-09 LAB — NOVEL CORONAVIRUS, NAA: SARS-CoV-2, NAA: NOT DETECTED

## 2018-10-10 ENCOUNTER — Telehealth: Payer: Self-pay | Admitting: Pulmonary Disease

## 2018-10-10 NOTE — Telephone Encounter (Signed)
Called pt and advised message from the provider. Pt understood and verbalized understanding. Nothing further is needed.   Virtual visit with Betty Green on 10/14/2018 at 3:30.

## 2018-10-10 NOTE — Telephone Encounter (Signed)
OK for virtual visit with APP

## 2018-10-10 NOTE — Telephone Encounter (Signed)
Spoke with pt, he needs a prescription for a new CPAP mask. After reviewing his chart, he has not been seen in the office since 01/2017. I advised him to make an appt because I wasn't sure if RA would sign the Rx. He declined and stated that he would do a virtual visit because he doesn't have 50 dollar copay right now. RA please advise.

## 2018-10-14 ENCOUNTER — Encounter: Payer: Self-pay | Admitting: Primary Care

## 2018-10-14 ENCOUNTER — Ambulatory Visit (INDEPENDENT_AMBULATORY_CARE_PROVIDER_SITE_OTHER): Payer: Medicare HMO | Admitting: Primary Care

## 2018-10-14 DIAGNOSIS — G4733 Obstructive sleep apnea (adult) (pediatric): Secondary | ICD-10-CM | POA: Diagnosis not present

## 2018-10-14 DIAGNOSIS — Z9989 Dependence on other enabling machines and devices: Secondary | ICD-10-CM | POA: Diagnosis not present

## 2018-10-14 NOTE — Patient Instructions (Signed)
Let us try using a full facemask (there are 2 different sample types in the bag that you can try)  Download in 1 month  Follow-up in 3 to 4 months with Dr. Elsworth Soho   CPAP and BPAP Information CPAP and BPAP are methods of helping a person breathe with the use of air pressure. CPAP stands for "continuous positive airway pressure." BPAP stands for "bi-level positive airway pressure." In both methods, air is blown through your nose or mouth and into your air passages to help you breathe well. CPAP and BPAP use different amounts of pressure to blow air. With CPAP, the amount of pressure stays the same while you breathe in and out. With BPAP, the amount of pressure is increased when you breathe in (inhale) so that you can take larger breaths. Your health care provider will recommend whether CPAP or BPAP would be more helpful for you. Why are CPAP and BPAP treatments used? CPAP or BPAP can be helpful if you have:  Sleep apnea.  Chronic obstructive pulmonary disease (COPD).  Heart failure.  Medical conditions that weaken the muscles of the chest including muscular dystrophy, or neurological diseases such as amyotrophic lateral sclerosis (ALS).  Other problems that cause breathing to be weak, abnormal, or difficult. CPAP is most commonly used for obstructive sleep apnea (OSA) to keep the airways from collapsing when the muscles relax during sleep. How is CPAP or BPAP administered? Both CPAP and BPAP are provided by a small machine with a flexible plastic tube that attaches to a plastic mask. You wear the mask. Air is blown through the mask into your nose or mouth. The amount of pressure that is used to blow the air can be adjusted on the machine. Your health care provider will determine the pressure setting that should be used based on your individual needs. When should CPAP or BPAP be used? In most cases, the mask only needs to be worn during sleep. Generally, the mask needs to be worn throughout the  night and during any daytime naps. People with certain medical conditions may also need to wear the mask at other times when they are awake. Follow instructions from your health care provider about when to use the machine. What are some tips for using the mask?   Because the mask needs to be snug, some people feel trapped or closed-in (claustrophobic) when first using the mask. If you feel this way, you may need to get used to the mask. One way to do this is by holding the mask loosely over your nose or mouth and then gradually applying the mask more snugly. You can also gradually increase the amount of time that you use the mask.  Masks are available in various types and sizes. Some fit over your mouth and nose while others fit over just your nose. If your mask does not fit well, talk with your health care provider about getting a different one.  If you are using a mask that fits over your nose and you tend to breathe through your mouth, a chin strap may be applied to help keep your mouth closed.  The CPAP and BPAP machines have alarms that may sound if the mask comes off or develops a leak.  If you have trouble with the mask, it is very important that you talk with your health care provider about finding a way to make the mask easier to tolerate. Do not stop using the mask. Stopping the use of the mask could  have a negative impact on your health. What are some tips for using the machine?  Place your CPAP or BPAP machine on a secure table or stand near an electrical outlet.  Know where the on/off switch is located on the machine.  Follow instructions from your health care provider about how to set the pressure on your machine and when you should use it.  Do not eat or drink while the CPAP or BPAP machine is on. Food or fluids could get pushed into your lungs by the pressure of the CPAP or BPAP.  Do not smoke. Tobacco smoke residue can damage the machine.  For home use, CPAP and BPAP  machines can be rented or purchased through home health care companies. Many different brands of machines are available. Renting a machine before purchasing may help you find out which particular machine works well for you.  Keep the CPAP or BPAP machine and attachments clean. Ask your health care provider for specific instructions. Get help right away if:  You have redness or open areas around your nose or mouth where the mask fits.  You have trouble using the CPAP or BPAP machine.  You cannot tolerate wearing the CPAP or BPAP mask.  You have pain, discomfort, and bloating in your abdomen. Summary  CPAP and BPAP are methods of helping a person breathe with the use of air pressure.  Both CPAP and BPAP are provided by a small machine with a flexible plastic tube that attaches to a plastic mask.  If you have trouble with the mask, it is very important that you talk with your health care provider about finding a way to make the mask easier to tolerate. This information is not intended to replace advice given to you by your health care provider. Make sure you discuss any questions you have with your health care provider. Document Released: 10/21/2003 Document Revised: 05/14/2018 Document Reviewed: 12/12/2015 Elsevier Patient Education  2020 [employer].

## 2018-10-14 NOTE — Progress Notes (Signed)
Virtual Visit via Telephone Note  I connected with Betty Green on 10/14/18 at  3:30 PM EDT by telephone and verified that I am speaking with the correct person using two identifiers.  Location: Patient: Home Provider: Office   I discussed the limitations, risks, security and privacy concerns of performing an evaluation and management service by telephone and the availability of in person appointments. I also discussed with the patient that there may be a patient responsible charge related to this service. The patient expressed understanding and agreed to proceed.   History of Present Illness: 73 year old female, former smoker quit in 1986 (0.2 pack year hx).  Medical history significant for obstructive sleep apnea, insomnia, obesity, seasonal allergic rhinitis, eczema, osteoporosis, anxiety/depression.  Patient of Dr. Elsworth Soho, last seen on 01/08/2017. NPSG in 2013  (271 lbs) AHI of 37 >> cpap 12 cm water.   10/14/2018 Patient contacted today for tele-visit.  She has been having some trouble with mask not fit and experiencing air leaks. Currently using full face nasal mask. She has tried small- large nasal mask.  She is interested in trying "Easy breath" or another type of mask.   [employer] download 08/08-09/06 Usage days 21/30 (70%); >4 hours 19 days (63%) Average usage days used 7 hours and 6 minutes Min pressure 13 cm capital H2O; max pressure 15 cmH2O Air leaks 36.1 L/minute (95th percentile) AHI 0.7  Observations/Objective:  -No shortness of breath, wheezing or cough noted during phone conversation  Assessment and Plan:  OSA: - Moderately compliant with CPAP mask - Reports mask fit issues and air leakage  - Pressure 13-15cm H20; AHI 0.7 - Trial dream wear full facemask (sample given) - Download in 1 month   Follow Up Instructions:  FU 3-4 months with Dr. Elsworth Soho in office    I discussed the assessment and treatment plan with the patient. The patient was provided an opportunity to  ask questions and all were answered. The patient agreed with the plan and demonstrated an understanding of the instructions.   The patient was advised to call back or seek an in-person evaluation if the symptoms worsen or if the condition fails to improve as anticipated.  I provided 15 minutes of non-face-to-face time during this encounter.   Martyn Ehrich, NP

## 2018-10-22 ENCOUNTER — Telehealth: Payer: Self-pay | Admitting: Pulmonary Disease

## 2018-10-22 NOTE — Telephone Encounter (Signed)
Did not see any forms in RA's inbox up front this afternoon. Will hold in triage until I receive the forms.

## 2018-10-24 NOTE — Telephone Encounter (Signed)
Checked RA's inbox again. Did not see any forms. Spoke with Mickel Baas with [employer]. Advised her that we have not received the forms. She will have the billing dept to refax the form. Will leave in triage.

## 2018-10-27 ENCOUNTER — Encounter: Payer: Self-pay | Admitting: Internal Medicine

## 2018-10-27 NOTE — Telephone Encounter (Signed)
I have the form from [place] and it has been put in Dr. Bari Mantis CMN folder on his desk for him to sign

## 2018-10-28 ENCOUNTER — Encounter: Payer: Self-pay | Admitting: Internal Medicine

## 2018-10-28 DIAGNOSIS — R251 Tremor, unspecified: Secondary | ICD-10-CM

## 2018-10-30 NOTE — Telephone Encounter (Signed)
This form was signed by Dr. Elsworth Soho on 10/28/2018 and I faxed it to them Fax # [phone] on 10/28/2018. I did receive confirmation that the fax was received

## 2018-10-30 NOTE — Telephone Encounter (Signed)
Rodena Piety do you have any update on this form from Dr. Elsworth Soho?

## 2018-11-05 ENCOUNTER — Encounter: Payer: Self-pay | Admitting: Internal Medicine

## 2018-11-06 ENCOUNTER — Encounter: Payer: Self-pay | Admitting: Neurology

## 2018-11-06 NOTE — Telephone Encounter (Signed)
PCCs to see pt concern 

## 2018-11-12 ENCOUNTER — Encounter: Payer: Self-pay | Admitting: Internal Medicine

## 2018-11-12 DIAGNOSIS — M1990 Unspecified osteoarthritis, unspecified site: Secondary | ICD-10-CM

## 2018-11-13 ENCOUNTER — Encounter: Payer: Self-pay | Admitting: Internal Medicine

## 2018-11-13 DIAGNOSIS — M1710 Unilateral primary osteoarthritis, unspecified knee: Secondary | ICD-10-CM

## 2018-11-27 DIAGNOSIS — M25512 Pain in left shoulder: Secondary | ICD-10-CM | POA: Diagnosis not present

## 2018-11-27 DIAGNOSIS — M25561 Pain in right knee: Secondary | ICD-10-CM | POA: Diagnosis not present

## 2018-11-27 DIAGNOSIS — M25562 Pain in left knee: Secondary | ICD-10-CM | POA: Diagnosis not present

## 2018-11-27 DIAGNOSIS — M25511 Pain in right shoulder: Secondary | ICD-10-CM | POA: Diagnosis not present

## 2018-12-02 ENCOUNTER — Encounter: Payer: Self-pay | Admitting: Family Medicine

## 2018-12-04 ENCOUNTER — Ambulatory Visit: Payer: Medicare HMO | Admitting: Family Medicine

## 2018-12-10 NOTE — Progress Notes (Deleted)
Betty Green was seen today in the movement disorders clinic for neurologic consultation at the request of Biagio Borg, MD.  The consultation is for the evaluation of L hand tremor.  The records that were made available to me were reviewed.  Tremor: {yes no:314532}   How long has it been going on? ***  At rest or with activation?  ***  When is it noted the most?  ***  Fam hx of tremor?  {yes [id number]  Located where?  ***  Affected by caffeine:  {yes no:314532}  Affected by alcohol:  {yes no:314532}  Affected by stress:  {yes no:314532}  Affected by fatigue:  {yes no:314532}  Spills soup if on spoon:  {yes no:314532}  Spills glass of liquid if full:  {yes no:314532}  Affects ADL's (tying shoes, brushing teeth, etc):  {yes no:314532}  Tremor inducing meds:  No.  Other Specific Symptoms:  Voice: *** Sleep: ***  Vivid Dreams:  {yes no:314532}  Acting out dreams:  {yes no:314532} Wet Pillows: {yes no:314532} Postural symptoms:  {yes no:314532}  Falls?  {yes no:314532} Bradykinesia symptoms: {parkinson brady:18041} Loss of smell:  {yes no:314532} Loss of taste:  {yes no:314532} Urinary Incontinence:  {yes no:314532} Difficulty Swallowing:  {yes no:314532} Handwriting, micrographia: {yes no:314532} Trouble with ADL's:  {yes no:314532}  Trouble buttoning clothing: {yes no:314532} Depression:  {yes no:314532} Memory changes:  {yes no:314532} Hallucinations:  {yes no:314532}  visual distortions: {yes no:314532} N/V:  {yes no:314532} Lightheaded:  {yes no:314532}  Syncope: {yes no:314532} Diplopia:  {yes no:314532} Dyskinesia:  {yes no:314532}  Neuroimaging of the brain has not previously been performed.  It *** available for my review today.  PREVIOUS MEDICATIONS: {Parkinson's RX:18200}  ALLERGIES:   Allergies  Allergen Reactions  . Amoxicillin-Pot Clavulanate     Hives  . Azithromycin Other (See Comments)    abd pain  . Lipitor [Atorvastatin] Itching     CURRENT MEDICATIONS:  Current Outpatient Medications  Medication Instructions  . albuterol (VENTOLIN HFA) 108 (90 Base) MCG/ACT inhaler Inhalation, Every 6 hours PRN  . cetirizine (ZYRTEC) 10 mg, Oral, Daily  . citalopram (CELEXA) 10 mg, Oral, Daily  . fesoterodine (TOVIAZ) 4 mg, Oral, Daily  . furosemide (LASIX) 80 mg, Oral, 2 times daily  . hydrOXYzine (ATARAX/VISTARIL) 25 MG tablet TAKE 1-2 TABS BY MOUTH THREE TIMES PER DAY AS NEEDED FOR ITCHING  . naproxen (NAPROSYN) 500 MG tablet TAKE 1 TABLET BY MOUTH TWICE A DAY AS NEEDED  . tiZANidine (ZANAFLEX) 2 mg, Oral, Every 6 hours PRN  . traMADol (ULTRAM) 50 mg, Oral, 4 times daily PRN  . triamcinolone (NASACORT) 55 MCG/ACT AERO nasal inhaler 2 sprays, Nasal, Daily    PAST MEDICAL HISTORY:   Past Medical History:  Diagnosis Date  . Allergy   . Anxiety    "take RX prn"  . Arthritis    "all over"  . Asthma   . Chronic bronchitis ([place])   . GERD (gastroesophageal reflux disease)   . Hypertension   . Insomnia 05/31/2011  . OSA on CPAP    wears CPAP sometimes (06/23/2014)  . Other and unspecified hyperlipidemia 08/04/2012  . PONV (postoperative nausea and vomiting)   . Shortness of breath dyspnea    with exertion  . Shoulder pain, bilateral 10/18/2010  . Vitamin D deficiency 12/21/2015    PAST SURGICAL HISTORY:   Past Surgical History:  Procedure Laterality Date  . APPENDECTOMY  1966  . BREAST EXCISIONAL BIOPSY Right   . CHOLECYSTECTOMY OPEN  [place]    . ORIF ANKLE FRACTURE Right 06/23/2014  . ORIF ANKLE FRACTURE Right 06/23/2014   Procedure: Take Down [place] Right Ankle, Open Reduction Internal Fixation Right Ankle, Possible Syndesmosis Repair, Medial Joint Debridement;  Surgeon: Newt Minion, MD;  Location: [facility];  Service: Orthopedics;  Laterality: Right;  . TONSILLECTOMY  1963  . TOTAL ABDOMINAL HYSTERECTOMY  1980   w/BSO    SOCIAL HISTORY:   Social History   Socioeconomic History  . Marital  status: Single    Spouse name: Not on file  . Number of children: 2  . Years of education: 28  . Highest education level: Not on file  Occupational History  . Occupation: retired    Fish farm manager: [employer]  . Financial resource strain: Not on file  . Food insecurity    Worry: Not on file    Inability: Not on file  . Transportation needs    Medical: Not on file    Non-medical: Not on file  Tobacco Use  . Smoking status: Former Smoker    Packs/day: 0.10    Years: 2.00    Pack years: 0.20    Types: Cigarettes    Quit date: 02/06/1984    Years since quitting: 34.8  . Smokeless tobacco: Never Used  Substance and Sexual Activity  . Alcohol use: No  . Drug use: No  . Sexual activity: Not Currently  Lifestyle  . Physical activity    Days per week: Not on file    Minutes per session: Not on file  . Stress: Not on file  Relationships  . Social Herbalist on phone: Not on file    Gets together: Not on file    Attends religious service: Not on file    Active member of club or organization: Not on file    Attends meetings of clubs or organizations: Not on file    Relationship status: Not on file  . Intimate partner violence    Fear of current or ex partner: Not on file    Emotionally abused: Not on file    Physically abused: Not on file    Forced sexual activity: Not on file  Other Topics Concern  . Not on file  Social History Narrative  . Not on file    FAMILY HISTORY:   Family Status  Relation Name Status  . Other grandparent Alive  . Mother  (Not Specified)  . Father  (Not Specified)  . Other  (Not Specified)    ROS:  ROS  PHYSICAL EXAMINATION:    VITALS:  There were no vitals filed for this visit.  GEN:  The patient appears stated age and is in NAD. HEENT:  Normocephalic, atraumatic.  The mucous membranes are moist. The superficial temporal arteries are without ropiness or tenderness. CV:  RRR Lungs:  CTAB Neck/HEME:  There  are no carotid bruits bilaterally.  Neurological examination:  Orientation: The patient is alert and oriented x3. Fund of knowledge is appropriate.  Recent and remote memory are intact.  Attention and concentration are normal.    Able to name objects and repeat phrases. Cranial nerves: There is good facial symmetry. Pupils are equal round and reactive to light bilaterally. Fundoscopic exam reveals clear margins bilaterally. Extraocular muscles are intact. The visual fields are full to confrontational testing. The speech is fluent and clear. Soft palate rises symmetrically and there is no tongue deviation. Hearing is intact  to conversational tone. Sensation: Sensation is intact to light and pinprick throughout (facial, trunk, extremities). Vibration is intact at the bilateral big toe. There is no extinction with double simultaneous stimulation. There is no sensory dermatomal level identified. Motor: Strength is 5/5 in the bilateral upper and lower extremities.   Shoulder shrug is equal and symmetric.  There is no pronator drift. Deep tendon reflexes: Deep tendon reflexes are 2/4 at the bilateral biceps, triceps, brachioradialis, patella and achilles. Plantar responses are downgoing bilaterally.  Movement examination: Tone: There is ***tone in the bilateral upper extremities.  The tone in the lower extremities is ***.  Abnormal movements: *** Coordination:  There is *** decremation with RAM's, *** Gait and Station: The patient has *** difficulty arising out of a deep-seated chair without the use of the hands. The patient's stride length is ***.  The patient has a *** pull test.      Lab Results  Component Value Date   TSH 1.08 10/14/2017     Chemistry      Component Value Date/Time   NA 138 10/14/2017 1229   K 4.3 10/14/2017 1229   CL 102 10/14/2017 1229   CO2 30 10/14/2017 1229   BUN 19 10/14/2017 1229   CREATININE 1.00 10/14/2017 1229      Component Value Date/Time   CALCIUM 9.9  10/14/2017 1229   ALKPHOS 72 10/14/2017 1229   AST 12 10/14/2017 1229   ALT 13 10/14/2017 1229   BILITOT 0.4 10/14/2017 1229       ASSESSMENT/PLAN:  ***  Cc:  Biagio Borg, MD

## 2018-12-15 ENCOUNTER — Ambulatory Visit: Payer: Medicare HMO | Admitting: Neurology

## 2018-12-25 ENCOUNTER — Other Ambulatory Visit: Payer: Self-pay | Admitting: Internal Medicine

## 2018-12-25 NOTE — Telephone Encounter (Signed)
Done erx 

## 2018-12-30 ENCOUNTER — Encounter: Payer: Self-pay | Admitting: Internal Medicine

## 2018-12-30 MED ORDER — HYDROXYZINE HCL 25 MG PO TABS: ORAL_TABLET | ORAL | 1 refills | Status: DC

## 2019-01-06 ENCOUNTER — Encounter: Payer: Self-pay | Admitting: Internal Medicine

## 2019-01-07 NOTE — Telephone Encounter (Signed)
Staff to contact pt - needs OV for SOB

## 2019-01-08 ENCOUNTER — Telehealth: Payer: Self-pay | Admitting: Pulmonary Disease

## 2019-01-08 NOTE — Telephone Encounter (Signed)
Patient is going to make appt with pulmonary

## 2019-01-08 NOTE — Telephone Encounter (Signed)
LMTCB

## 2019-01-12 NOTE — Progress Notes (Deleted)
Betty Green was seen today in the movement disorders clinic for neurologic consultation at the request of Biagio Borg, MD.  The consultation is for the evaluation of L hand tremor.  The records that were made available to me were reviewed.  Tremor: {yes no:314532}   How long has it been going on? ***  At rest or with activation?  ***  When is it noted the most?  ***  Fam hx of tremor?  {yes LI:[id number]  Located where?  ***  Affected by caffeine:  {yes no:314532}  Affected by alcohol:  {yes no:314532}  Affected by stress:  {yes no:314532}  Affected by fatigue:  {yes no:314532}  Spills soup if on spoon:  {yes no:314532}  Spills glass of liquid if full:  {yes no:314532}  Affects ADL's (tying shoes, brushing teeth, etc):  {yes no:314532}  Tremor inducing meds:  No.  Other Specific Symptoms:  Voice: *** Sleep: ***  Vivid Dreams:  {yes no:314532}  Acting out dreams:  {yes no:314532} Wet Pillows: {yes no:314532} Postural symptoms:  {yes no:314532}  Falls?  {yes no:314532} Bradykinesia symptoms: {parkinson brady:18041} Loss of smell:  {yes no:314532} Loss of taste:  {yes no:314532} Urinary Incontinence:  {yes no:314532} Difficulty Swallowing:  {yes no:314532} Handwriting, micrographia: {yes no:314532} Trouble with ADL's:  {yes no:314532}  Trouble buttoning clothing: {yes no:314532} Depression:  {yes no:314532} Memory changes:  {yes no:314532} Hallucinations:  {yes no:314532}  visual distortions: {yes no:314532} N/V:  {yes no:314532} Lightheaded:  {yes no:314532}  Syncope: {yes no:314532} Diplopia:  {yes no:314532} Dyskinesia:  {yes no:314532}  Neuroimaging of the brain has not previously been performed.  It *** available for my review today.  PREVIOUS MEDICATIONS: {Parkinson's RX:18200}  ALLERGIES:   Allergies  Allergen Reactions  . Amoxicillin-Pot Clavulanate     Hives  . Azithromycin Other (See Comments)    abd pain  . Lipitor [Atorvastatin] Itching     CURRENT MEDICATIONS:  Current Outpatient Medications  Medication Instructions  . albuterol (VENTOLIN HFA) 108 (90 Base) MCG/ACT inhaler Inhalation, Every 6 hours PRN  . cetirizine (ZYRTEC) 10 mg, Oral, Daily  . citalopram (CELEXA) 10 mg, Oral, Daily  . fesoterodine (TOVIAZ) 4 mg, Oral, Daily  . furosemide (LASIX) 80 mg, Oral, 2 times daily  . hydrOXYzine (ATARAX/VISTARIL) 25 MG tablet TAKE 1-2 TABS BY MOUTH THREE TIMES PER DAY AS NEEDED FOR ITCHING  . naproxen (NAPROSYN) 500 MG tablet TAKE 1 TABLET BY MOUTH TWICE A DAY AS NEEDED  . tiZANidine (ZANAFLEX) 2 mg, Oral, Every 6 hours PRN  . traMADol (ULTRAM) 50 mg, Oral, 4 times daily PRN  . triamcinolone (NASACORT) 55 MCG/ACT AERO nasal inhaler 2 sprays, Nasal, Daily    PAST MEDICAL HISTORY:   Past Medical History:  Diagnosis Date  . Allergy   . Anxiety    "take RX prn"  . Arthritis    "all over"  . Asthma   . Chronic bronchitis ([place])   . GERD (gastroesophageal reflux disease)   . Hypertension   . Insomnia 05/31/2011  . OSA on CPAP    wears CPAP sometimes (06/23/2014)  . Other and unspecified hyperlipidemia 08/04/2012  . PONV (postoperative nausea and vomiting)   . Shortness of breath dyspnea    with exertion  . Shoulder pain, bilateral 10/18/2010  . Vitamin D deficiency 12/21/2015    PAST SURGICAL HISTORY:   Past Surgical History:  Procedure Laterality Date  . APPENDECTOMY  1966  . BREAST EXCISIONAL BIOPSY Right   . CHOLECYSTECTOMY OPEN  [place]    . ORIF ANKLE FRACTURE Right 06/23/2014  . ORIF ANKLE FRACTURE Right 06/23/2014   Procedure: Take Down Malunion Right Ankle, Open Reduction Internal Fixation Right Ankle, Possible Syndesmosis Repair, Medial Joint Debridement;  Surgeon: Newt Minion, MD;  Location: [place];  Service: Orthopedics;  Laterality: Right;  . TONSILLECTOMY  1963  . TOTAL ABDOMINAL HYSTERECTOMY  1980   w/BSO    SOCIAL HISTORY:   Social History   Socioeconomic History  . Marital  status: Single    Spouse name: Not on file  . Number of children: 2  . Years of education: 17  . Highest education level: Not on file  Occupational History  . Occupation: retired    Fish farm manager: [employer]  . Financial resource strain: Not on file  . Food insecurity    Worry: Not on file    Inability: Not on file  . Transportation needs    Medical: Not on file    Non-medical: Not on file  Tobacco Use  . Smoking status: Former Smoker    Packs/day: 0.10    Years: 2.00    Pack years: 0.20    Types: Cigarettes    Quit date: 02/06/1984    Years since quitting: 34.9  . Smokeless tobacco: Never Used  Substance and Sexual Activity  . Alcohol use: No  . Drug use: No  . Sexual activity: Not Currently  Lifestyle  . Physical activity    Days per week: Not on file    Minutes per session: Not on file  . Stress: Not on file  Relationships  . Social Herbalist on phone: Not on file    Gets together: Not on file    Attends religious service: Not on file    Active member of club or organization: Not on file    Attends meetings of clubs or organizations: Not on file    Relationship status: Not on file  . Intimate partner violence    Fear of current or ex partner: Not on file    Emotionally abused: Not on file    Physically abused: Not on file    Forced sexual activity: Not on file  Other Topics Concern  . Not on file  Social History Narrative  . Not on file    FAMILY HISTORY:   Family Status  Relation Name Status  . Other grandparent Alive  . Mother  (Not Specified)  . Father  (Not Specified)  . Other  (Not Specified)    ROS:  ROS  PHYSICAL EXAMINATION:    VITALS:  There were no vitals filed for this visit.  GEN:  The patient appears stated age and is in NAD. HEENT:  Normocephalic, atraumatic.  The mucous membranes are moist. The superficial temporal arteries are without ropiness or tenderness. CV:  RRR Lungs:  CTAB Neck/HEME:  There  are no carotid bruits bilaterally.  Neurological examination:  Orientation: The patient is alert and oriented x3. Fund of knowledge is appropriate.  Recent and remote memory are intact.  Attention and concentration are normal.    Able to name objects and repeat phrases. Cranial nerves: There is good facial symmetry. Pupils are equal round and reactive to light bilaterally. Fundoscopic exam reveals clear margins bilaterally. Extraocular muscles are intact. The visual fields are full to confrontational testing. The speech is fluent and clear. Soft palate rises symmetrically and there is no tongue deviation. Hearing is intact  to conversational tone. Sensation: Sensation is intact to light and pinprick throughout (facial, trunk, extremities). Vibration is intact at the bilateral big toe. There is no extinction with double simultaneous stimulation. There is no sensory dermatomal level identified. Motor: Strength is 5/5 in the bilateral upper and lower extremities.   Shoulder shrug is equal and symmetric.  There is no pronator drift. Deep tendon reflexes: Deep tendon reflexes are 2/4 at the bilateral biceps, triceps, brachioradialis, patella and achilles. Plantar responses are downgoing bilaterally.  Movement examination: Tone: There is ***tone in the bilateral upper extremities.  The tone in the lower extremities is ***.  Abnormal movements: *** Coordination:  There is *** decremation with RAM's, *** Gait and Station: The patient has *** difficulty arising out of a deep-seated chair without the use of the hands. The patient's stride length is ***.  The patient has a *** pull test.      Lab Results  Component Value Date   TSH 1.08 10/14/2017     Chemistry      Component Value Date/Time   NA 138 10/14/2017 1229   K 4.3 10/14/2017 1229   CL 102 10/14/2017 1229   CO2 30 10/14/2017 1229   BUN 19 10/14/2017 1229   CREATININE 1.00 10/14/2017 1229      Component Value Date/Time   CALCIUM 9.9  10/14/2017 1229   ALKPHOS 72 10/14/2017 1229   AST 12 10/14/2017 1229   ALT 13 10/14/2017 1229   BILITOT 0.4 10/14/2017 1229       ASSESSMENT/PLAN:  ***  Cc:  Biagio Borg, MD

## 2019-01-13 NOTE — Telephone Encounter (Signed)
Attempted to call pt but unable to reach. Left message for pt to return call. 

## 2019-01-14 NOTE — Telephone Encounter (Signed)
Pt returning call regarding prescription.  Something other than ProAir.  [phone]

## 2019-01-14 NOTE — Telephone Encounter (Signed)
LMTCB x3 for pt.  

## 2019-01-15 ENCOUNTER — Ambulatory Visit: Payer: Medicare HMO | Admitting: Neurology

## 2019-01-15 NOTE — Telephone Encounter (Signed)
LMTCB

## 2019-01-16 MED ORDER — ALBUTEROL SULFATE HFA 108 (90 BASE) MCG/ACT IN AERS: 1.0000 | INHALATION_SPRAY | Freq: Four times a day (QID) | RESPIRATORY_TRACT | 3 refills | Status: DC | PRN

## 2019-01-16 NOTE — Telephone Encounter (Signed)
Left message for patient to call back.   Called [facility] to what is needed as Ventolin is listed on her chart, not ProAir. They stated that they did not have any medication rejections in their system. She just needs a new RX for the albuterol. Will go ahead and send in the Ventolin.

## 2019-01-25 ENCOUNTER — Other Ambulatory Visit: Payer: Self-pay | Admitting: Internal Medicine

## 2019-02-20 ENCOUNTER — Ambulatory Visit: Payer: Medicare HMO | Admitting: Neurology

## 2019-03-08 ENCOUNTER — Encounter: Payer: Self-pay | Admitting: Internal Medicine

## 2019-03-11 ENCOUNTER — Encounter: Payer: Self-pay | Admitting: Internal Medicine

## 2019-03-12 ENCOUNTER — Encounter: Payer: Self-pay | Admitting: Internal Medicine

## 2019-03-12 ENCOUNTER — Ambulatory Visit (INDEPENDENT_AMBULATORY_CARE_PROVIDER_SITE_OTHER): Payer: Medicare HMO | Admitting: Internal Medicine

## 2019-03-12 ENCOUNTER — Encounter: Payer: Self-pay | Admitting: Family Medicine

## 2019-03-12 DIAGNOSIS — F329 Major depressive disorder, single episode, unspecified: Secondary | ICD-10-CM

## 2019-03-12 DIAGNOSIS — R739 Hyperglycemia, unspecified: Secondary | ICD-10-CM

## 2019-03-12 DIAGNOSIS — R519 Headache, unspecified: Secondary | ICD-10-CM

## 2019-03-12 DIAGNOSIS — R69 Illness, unspecified: Secondary | ICD-10-CM | POA: Diagnosis not present

## 2019-03-12 DIAGNOSIS — J45909 Unspecified asthma, uncomplicated: Secondary | ICD-10-CM

## 2019-03-12 DIAGNOSIS — F32A Depression, unspecified: Secondary | ICD-10-CM

## 2019-03-12 MED ORDER — HYDROCODONE-ACETAMINOPHEN 5-325 MG PO TABS: 1.0000 | ORAL_TABLET | Freq: Four times a day (QID) | ORAL | 0 refills | Status: DC | PRN

## 2019-03-12 MED ORDER — CARBAMAZEPINE ER 100 MG PO CP12: 100.0000 mg | ORAL_CAPSULE | Freq: Two times a day (BID) | ORAL | 5 refills | Status: DC

## 2019-03-12 MED ORDER — NAPROXEN 500 MG PO TABS: 500.0000 mg | ORAL_TABLET | Freq: Two times a day (BID) | ORAL | 0 refills | Status: DC | PRN

## 2019-03-12 NOTE — Assessment & Plan Note (Signed)
stable overall by history and exam, recent data reviewed with pt, and pt to continue medical treatment as before,  to f/u any worsening symptoms or concerns  

## 2019-03-12 NOTE — Assessment & Plan Note (Addendum)
I suspect trigemina neuralgia, for tegetrol 100 bid and hydrocodone prn,  to f/u any worsening symptoms or concerns, delcines NS referral  I spent31 minutes preparing to see the patient by review of recent labs, imaging and procedures, obtaining and reviewing separately obtained history, communicating with the patient and family or caregiver, ordering medications, tests or procedures, and documenting clinical information in the EHR including the differential Dx, treatment, and any further evaluation and other management of left facial pain, hyperglycemia, asthma, depression

## 2019-03-12 NOTE — Patient Instructions (Signed)
Please take all new medication as prescribed 

## 2019-03-12 NOTE — Progress Notes (Signed)
Patient ID: Betty Green, female   DOB: 07-13-1945, 74 y.o.   MRN: [id number]  Virtual Visit via Video Note  I connected with Betty Green on 03/12/19 at  4:20 PM EST by a video enabled telemedicine application and verified that I am speaking with the correct person using two identifiers.  Location: Patient: at home Provider: at office  I discussed the limitations of evaluation and management by telemedicine and the availability of in person appointments. The patient expressed understanding and agreed to proceed.  History of Present Illness: Here with 1 wk onset left facial pain preauricular with raidation mostly to the left temple and left mid face, somewhat to the jaw as well, without fever, trauma, rash, swelling, sinus congestion, hearing decrease, ST, cough and Pt denies chest pain, increased sob or doe, wheezing, orthopnea, PND, increased LE swelling, palpitations, dizziness or syncope.  Denies worsening depressive symptoms, suicidal ideation, or panic; has ongoing anxiety, not increased recently.  Past Medical History:  Diagnosis Date  . Allergy   . Anxiety    "take RX prn"  . Arthritis    "all over"  . Asthma   . Chronic bronchitis ([place])   . GERD (gastroesophageal reflux disease)   . Hypertension   . Insomnia 05/31/2011  . OSA on CPAP    wears CPAP sometimes (06/23/2014)  . Other and unspecified hyperlipidemia 08/04/2012  . PONV (postoperative nausea and vomiting)   . Shortness of breath dyspnea    with exertion  . Shoulder pain, bilateral 10/18/2010  . Vitamin D deficiency 12/21/2015   Past Surgical History:  Procedure Laterality Date  . APPENDECTOMY  1966  . BREAST EXCISIONAL BIOPSY Right   . CHOLECYSTECTOMY OPEN  1981  . FRACTURE SURGERY    . ORIF ANKLE FRACTURE Right 06/23/2014  . ORIF ANKLE FRACTURE Right 06/23/2014   Procedure: Take Down Malunion Right Ankle, Open Reduction Internal Fixation Right Ankle, Possible Syndesmosis Repair, Medial Joint Debridement;   Surgeon: Newt Minion, MD;  Location: [place];  Service: Orthopedics;  Laterality: Right;  . TONSILLECTOMY  1963  . TOTAL ABDOMINAL HYSTERECTOMY  1980   w/BSO    reports that she quit smoking about 35 years ago. Her smoking use included cigarettes. She has a 0.20 pack-year smoking history. She has never used smokeless tobacco. She reports that she does not drink alcohol or use drugs. family history includes Arthritis in her father, mother, and another family member; Cancer in an other family member; Heart disease in her father and mother; Hypertension in her father and mother. Allergies  Allergen Reactions  . Amoxicillin-Pot Clavulanate     Hives  . Azithromycin Other (See Comments)    abd pain  . Lipitor [Atorvastatin] Itching   Current Outpatient Medications on File Prior to Visit  Medication Sig Dispense Refill  . albuterol (VENTOLIN HFA) 108 (90 Base) MCG/ACT inhaler Inhale 1-2 puffs into the lungs every 6 (six) hours as needed for wheezing or shortness of breath. 8 g 3  . cetirizine (ZYRTEC) 10 MG tablet Take 1 tablet (10 mg total) by mouth daily. 30 tablet 11  . citalopram (CELEXA) 10 MG tablet Take 1 tablet (10 mg total) by mouth daily. 90 tablet 3  . fesoterodine (TOVIAZ) 4 MG TB24 tablet Take 1 tablet (4 mg total) by mouth daily. (Patient not taking: Reported on 10/14/2018) 90 tablet 3  . furosemide (LASIX) 80 MG tablet Take 1 tablet (80 mg total) by mouth 2 (two) times daily. 180 tablet 3  .  hydrOXYzine (ATARAX/VISTARIL) 25 MG tablet TAKE 1-2 TABS BY MOUTH THREE TIMES PER DAY AS NEEDED FOR ITCHING 540 tablet 1  . meloxicam (MOBIC) 15 MG tablet Take 1 tablet (15 mg total) by mouth daily. with food. Keep follow=up appt with provider for future refills 90 tablet 0  . naproxen (NAPROSYN) 500 MG tablet Take 1 tablet (500 mg total) by mouth 2 (two) times daily as needed. 60 tablet 0  . tiZANidine (ZANAFLEX) 2 MG tablet Take 1 tablet (2 mg total) by mouth every 6 (six) hours as needed for  muscle spasms. 50 tablet 1  . traMADol (ULTRAM) 50 MG tablet TAKE 1 TABLET (50 MG TOTAL) BY MOUTH 4 (FOUR) TIMES DAILY AS NEEDED. 120 tablet 2  . triamcinolone (NASACORT) 55 MCG/ACT AERO nasal inhaler Place 2 sprays into the nose daily. 1 Inhaler 12   No current facility-administered medications on file prior to visit.    Observations/Objective: Alert, NAD, appropriate mood and affect, resps normal, cn 2-12 intact, moves all 4s, no visible rash or swelling Lab Results  Component Value Date   WBC 6.7 10/14/2017   HGB 14.3 10/14/2017   HCT 43.8 10/14/2017   PLT 297.0 10/14/2017   GLUCOSE 104 (H) 10/14/2017   CHOL 221 (H) 10/14/2017   TRIG 88.0 10/14/2017   HDL 76.90 10/14/2017   LDLDIRECT 134.1 05/02/2012   LDLCALC 127 (H) 10/14/2017   ALT 13 10/14/2017   AST 12 10/14/2017   NA 138 10/14/2017   K 4.3 10/14/2017   CL 102 10/14/2017   CREATININE 1.00 10/14/2017   BUN 19 10/14/2017   CO2 30 10/14/2017   TSH 1.08 10/14/2017   INR 1.05 06/22/2014   HGBA1C 6.0 10/14/2017    Assessment and Plan: See notes  Follow Up Instructions: See notes   I discussed the assessment and treatment plan with the patient. The patient was provided an opportunity to ask questions and all were answered. The patient agreed with the plan and demonstrated an understanding of the instructions.   The patient was advised to call back or seek an in-person evaluation if the symptoms worsen or if the condition fails to improve as anticipated.  Cathlean Cower, MD ]

## 2019-03-13 ENCOUNTER — Encounter: Payer: Self-pay | Admitting: Internal Medicine

## 2019-03-13 ENCOUNTER — Ambulatory Visit: Payer: Medicare HMO | Admitting: Internal Medicine

## 2019-03-14 MED ORDER — CARBAMAZEPINE 100 MG PO CHEW: 100.0000 mg | CHEWABLE_TABLET | Freq: Two times a day (BID) | ORAL | 5 refills | Status: DC

## 2019-03-31 ENCOUNTER — Other Ambulatory Visit: Payer: Self-pay | Admitting: Internal Medicine

## 2019-03-31 NOTE — Telephone Encounter (Signed)
Done erx 

## 2019-04-15 ENCOUNTER — Encounter: Payer: Self-pay | Admitting: Internal Medicine

## 2019-04-16 DIAGNOSIS — M25561 Pain in right knee: Secondary | ICD-10-CM | POA: Diagnosis not present

## 2019-04-16 DIAGNOSIS — M25562 Pain in left knee: Secondary | ICD-10-CM | POA: Diagnosis not present

## 2019-04-16 DIAGNOSIS — M170 Bilateral primary osteoarthritis of knee: Secondary | ICD-10-CM | POA: Diagnosis not present

## 2019-04-16 MED ORDER — TIZANIDINE HCL 2 MG PO TABS: 2.0000 mg | ORAL_TABLET | Freq: Four times a day (QID) | ORAL | 2 refills | Status: DC | PRN

## 2019-05-06 ENCOUNTER — Encounter: Payer: Self-pay | Admitting: Internal Medicine

## 2019-05-06 DIAGNOSIS — R251 Tremor, unspecified: Secondary | ICD-10-CM

## 2019-05-13 ENCOUNTER — Encounter: Payer: Self-pay | Admitting: Internal Medicine

## 2019-05-14 MED ORDER — ALBUTEROL SULFATE HFA 108 (90 BASE) MCG/ACT IN AERS: 1.0000 | INHALATION_SPRAY | Freq: Four times a day (QID) | RESPIRATORY_TRACT | 3 refills | Status: DC | PRN

## 2019-05-27 ENCOUNTER — Other Ambulatory Visit: Payer: Self-pay | Admitting: Family Medicine

## 2019-06-16 DIAGNOSIS — R251 Tremor, unspecified: Secondary | ICD-10-CM | POA: Diagnosis not present

## 2019-06-19 ENCOUNTER — Other Ambulatory Visit: Payer: Self-pay

## 2019-06-19 ENCOUNTER — Ambulatory Visit
Admission: RE | Admit: 2019-06-19 | Discharge: 2019-06-19 | Disposition: A | Discharge status: Home / Self Care | Payer: Medicare HMO | Source: Ambulatory Visit | Attending: Internal Medicine | Admitting: Internal Medicine

## 2019-06-19 DIAGNOSIS — Z1231 Encounter for screening mammogram for malignant neoplasm of breast: Secondary | ICD-10-CM | POA: Diagnosis not present

## 2019-06-25 ENCOUNTER — Ambulatory Visit: Payer: Medicare HMO | Admitting: Internal Medicine

## 2019-06-25 ENCOUNTER — Encounter: Payer: Self-pay | Admitting: Internal Medicine

## 2019-06-25 ENCOUNTER — Ambulatory Visit (INDEPENDENT_AMBULATORY_CARE_PROVIDER_SITE_OTHER): Payer: Medicare HMO | Admitting: Internal Medicine

## 2019-06-25 ENCOUNTER — Other Ambulatory Visit: Payer: Self-pay

## 2019-06-25 VITALS — BP 140/86 | HR 104 | Temp 98.5°F | Ht 62.0 in | Wt 288.0 lb

## 2019-06-25 DIAGNOSIS — R600 Localized edema: Secondary | ICD-10-CM

## 2019-06-25 DIAGNOSIS — J45909 Unspecified asthma, uncomplicated: Secondary | ICD-10-CM

## 2019-06-25 DIAGNOSIS — Z0001 Encounter for general adult medical examination with abnormal findings: Secondary | ICD-10-CM

## 2019-06-25 DIAGNOSIS — E559 Vitamin D deficiency, unspecified: Secondary | ICD-10-CM | POA: Diagnosis not present

## 2019-06-25 DIAGNOSIS — R739 Hyperglycemia, unspecified: Secondary | ICD-10-CM

## 2019-06-25 DIAGNOSIS — R609 Edema, unspecified: Secondary | ICD-10-CM

## 2019-06-25 DIAGNOSIS — M1990 Unspecified osteoarthritis, unspecified site: Secondary | ICD-10-CM

## 2019-06-25 DIAGNOSIS — I10 Essential (primary) hypertension: Secondary | ICD-10-CM | POA: Diagnosis not present

## 2019-06-25 MED ORDER — FUROSEMIDE 80 MG PO TABS: ORAL_TABLET | ORAL | 3 refills | Status: DC

## 2019-06-25 MED ORDER — TRAMADOL HCL 50 MG PO TABS: 50.0000 mg | ORAL_TABLET | Freq: Four times a day (QID) | ORAL | 2 refills | Status: DC | PRN

## 2019-06-25 MED ORDER — HYDROXYZINE HCL 25 MG PO TABS: ORAL_TABLET | ORAL | 1 refills | Status: DC

## 2019-06-25 MED ORDER — CELECOXIB 200 MG PO CAPS: 200.0000 mg | ORAL_CAPSULE | Freq: Two times a day (BID) | ORAL | 1 refills | Status: DC | PRN

## 2019-06-25 MED ORDER — SAXENDA 18 MG/3ML ~~LOC~~ SOPN: PEN_INJECTOR | SUBCUTANEOUS | 5 refills | Status: DC

## 2019-06-25 NOTE — Progress Notes (Signed)
Subjective:    Patient ID: Betty Green, female    DOB: 1946-01-31, 74 y.o.   MRN: [id number]  HPI  Here for wellness and f/u;  Overall doing ok;  Pt denies Chest pain, worsening SOB, DOE, wheezing, orthopnea, PND, palpitations, dizziness or syncope, but has 3-4 mo worsening leg swelling as she has run out of her lasix she takes prn only..  Pt denies neurological change such as new headache, facial or extremity weakness.  Pt denies polydipsia, polyuria, or low sugar symptoms. Pt states overall good compliance with treatment and medications, good tolerability, and has been trying to follow appropriate diet.  Pt denies worsening depressive symptoms, suicidal ideation or panic. No fever, night sweats, wt loss, loss of appetite, or other constitutional symptoms.  Pt states good ability with ADL's, has low fall risk, home safety reviewed and adequate, no other significant changes in hearing or vision, and only occasionally active with exercise.  Has ongoing arthritic pain mostly to back and knees and asks for change mobic to celebrex, tramadol helps occasionally as well.  Also has cont'd intermittent episodes of chronic itching, asks for refill atarax.  Has been unable to lose weight with more diet and exercise, asks for saxenda as well.  Wt Readings from Last 3 Encounters:  06/25/19 288 lb (130.6 kg)  09/10/18 289 lb (131.1 kg)  06/27/18 294 lb (133.4 kg)   Past Medical History:  Diagnosis Date  . Allergy   . Anxiety    "take RX prn"  . Arthritis    "all over"  . Asthma   . Chronic bronchitis ([place])   . GERD (gastroesophageal reflux disease)   . Hypertension   . Insomnia 05/31/2011  . OSA on CPAP    wears CPAP sometimes (06/23/2014)  . Other and unspecified hyperlipidemia 08/04/2012  . PONV (postoperative nausea and vomiting)   . Shortness of breath dyspnea    with exertion  . Shoulder pain, bilateral 10/18/2010  . Vitamin D deficiency 12/21/2015   Past Surgical History:  Procedure  Laterality Date  . APPENDECTOMY  1966  . BREAST EXCISIONAL BIOPSY Right   . CHOLECYSTECTOMY OPEN  1981  . FRACTURE SURGERY    . ORIF ANKLE FRACTURE Right 06/23/2014  . ORIF ANKLE FRACTURE Right 06/23/2014   Procedure: Take Down Malunion Right Ankle, Open Reduction Internal Fixation Right Ankle, Possible Syndesmosis Repair, Medial Joint Debridement;  Surgeon: Newt Minion, MD;  Location: [place];  Service: Orthopedics;  Laterality: Right;  . TONSILLECTOMY  1963  . TOTAL ABDOMINAL HYSTERECTOMY  1980   w/BSO    reports that she quit smoking about 35 years ago. Her smoking use included cigarettes. She has a 0.20 pack-year smoking history. She has never used smokeless tobacco. She reports that she does not drink alcohol or use drugs. family history includes Arthritis in her father, mother, and another family member; Cancer in an other family member; Heart disease in her father and mother; Hypertension in her father and mother. Allergies  Allergen Reactions  . Amoxicillin-Pot Clavulanate     Hives  . Azithromycin Other (See Comments)    abd pain  . Lipitor [Atorvastatin] Itching   Current Outpatient Medications on File Prior to Visit  Medication Sig Dispense Refill  . albuterol (VENTOLIN HFA) 108 (90 Base) MCG/ACT inhaler Inhale 1-2 puffs into the lungs every 6 (six) hours as needed for wheezing or shortness of breath. 8 g 3  . cetirizine (ZYRTEC) 10 MG tablet Take 1 tablet (10  mg total) by mouth daily. 30 tablet 11  . citalopram (CELEXA) 10 MG tablet Take 1 tablet (10 mg total) by mouth daily. 90 tablet 3  . tiZANidine (ZANAFLEX) 2 MG tablet Take 1 tablet (2 mg total) by mouth every 6 (six) hours as needed for muscle spasms. 50 tablet 2   No current facility-administered medications on file prior to visit.   Review of Systems All otherwise neg per pt     Objective:   Physical Exam BP 140/86 (BP Location: Left Arm, Patient Position: Sitting, Cuff Size: Large)   Pulse (!) 104   Temp 98.5  F (36.9 C) (Oral)   Ht 5\' 2"  (1.575 m)   Wt 288 lb (130.6 kg)   SpO2 97%   BMI 52.68 kg/m  VS noted,  Constitutional: Pt appears in NAD HENT: Head: NCAT.  Right Ear: External ear normal.  Left Ear: External ear normal.  Eyes: . Pupils are equal, round, and reactive to light. Conjunctivae and EOM are normal Nose: without d/c or deformity Neck: Neck supple. Gross normal ROM Cardiovascular: Normal rate and regular rhythm.   Pulmonary/Chest: Effort normal and breath sounds without rales or wheezing.  Abd:  Soft, NT, ND, + BS, no organomegaly Neurological: Pt is alert. At baseline orientation, motor grossly intact Skin: Skin is warm. No rashes, other new lesions, trace to 1+ bilat LE edema Psychiatric: Pt behavior is normal without agitation  All otherwise neg per pt Lab Results  Component Value Date   WBC 6.7 10/14/2017   HGB 14.3 10/14/2017   HCT 43.8 10/14/2017   PLT 297.0 10/14/2017   GLUCOSE 104 (H) 10/14/2017   CHOL 221 (H) 10/14/2017   TRIG 88.0 10/14/2017   HDL 76.90 10/14/2017   LDLDIRECT 134.1 05/02/2012   LDLCALC 127 (H) 10/14/2017   ALT 13 10/14/2017   AST 12 10/14/2017   NA 138 10/14/2017   K 4.3 10/14/2017   CL 102 10/14/2017   CREATININE 1.00 10/14/2017   BUN 19 10/14/2017   CO2 30 10/14/2017   TSH 1.08 10/14/2017   INR 1.05 06/22/2014   HGBA1C 6.0 10/14/2017        Assessment & Plan:

## 2019-06-25 NOTE — Patient Instructions (Signed)
Ok for change of the mobic to celebrex  Please take all new medication as prescribed - the lasix at 80 mg daily as needed for swelling  Please continue all other medications as before, and refills have been done if requested - the tramadol and hydroxyzine  Please take all new medication as prescribed - the saxenda  Please have the pharmacy call with any other refills you may need.  Please continue your efforts at being more active, low cholesterol diet, and weight control.  You are otherwise up to date with prevention measures today.  Please keep your appointments with your specialists as you may have planned  Please go to the LAB at the blood drawing area for the tests to be done - at the [place] will be contacted by phone if any changes need to be made immediately.  Otherwise, you will receive a letter about your results with an explanation, but please check with MyChart first.  Please remember to sign up for MyChart if you have not done so, as this will be important to you in the future with finding out test results, communicating by private email, and scheduling acute appointments online when needed.  Please make an Appointment to return in 6 months, or sooner if needed

## 2019-06-27 ENCOUNTER — Encounter: Payer: Self-pay | Admitting: Internal Medicine

## 2019-06-27 NOTE — Assessment & Plan Note (Signed)
Cont oral replacement 

## 2019-06-27 NOTE — Assessment & Plan Note (Signed)
stable overall by history and exam, recent data reviewed with pt, and pt to continue medical treatment as before,  to f/u any worsening symptoms or concerns  

## 2019-06-27 NOTE — Assessment & Plan Note (Addendum)
Ok for restart /refill lasix 80 qd prn,  to f/u any worsening symptoms or concerns  I spent 40 minutes in addition to time for CPX wellness examination in preparing to see thepatient by review of recent labs, imaging and procedures, obtaining and reviewing separately obtained history, communicating with the patient and family or caregiver, ordering medications, tests or procedures, and documenting clinical information in the EHR including the differential Dx, treatment, and any further evaluation and other management of edema, asthma, arthritis, hyperglycemia, obesity, htn, vit d def

## 2019-06-27 NOTE — Assessment & Plan Note (Signed)
Ok for saxenda if covered per insurance

## 2019-06-27 NOTE — Assessment & Plan Note (Signed)
[place] for change mobic to celebrex prn

## 2019-06-27 NOTE — Assessment & Plan Note (Signed)

## 2019-06-29 DIAGNOSIS — G939 Disorder of brain, unspecified: Secondary | ICD-10-CM | POA: Diagnosis not present

## 2019-07-27 ENCOUNTER — Encounter: Payer: Self-pay | Admitting: Internal Medicine

## 2019-07-27 ENCOUNTER — Other Ambulatory Visit (INDEPENDENT_AMBULATORY_CARE_PROVIDER_SITE_OTHER): Payer: Medicare HMO

## 2019-07-27 ENCOUNTER — Other Ambulatory Visit: Payer: Self-pay | Admitting: Internal Medicine

## 2019-07-27 DIAGNOSIS — D320 Benign neoplasm of cerebral meninges: Secondary | ICD-10-CM | POA: Diagnosis not present

## 2019-07-27 DIAGNOSIS — E559 Vitamin D deficiency, unspecified: Secondary | ICD-10-CM

## 2019-07-27 DIAGNOSIS — R251 Tremor, unspecified: Secondary | ICD-10-CM | POA: Diagnosis not present

## 2019-07-27 DIAGNOSIS — R739 Hyperglycemia, unspecified: Secondary | ICD-10-CM

## 2019-07-27 DIAGNOSIS — Z0001 Encounter for general adult medical examination with abnormal findings: Secondary | ICD-10-CM

## 2019-07-27 LAB — CBC WITH DIFFERENTIAL/PLATELET
Basophils Absolute: 0.1 10*3/uL (ref 0.0–0.1)
Basophils Relative: 0.9 % (ref 0.0–3.0)
Eosinophils Absolute: 0.4 10*3/uL (ref 0.0–0.7)
Eosinophils Relative: 5.1 % — ABNORMAL HIGH (ref 0.0–5.0)
HCT: 38.3 % (ref 36.0–46.0)
Hemoglobin: 12.7 g/dL (ref 12.0–15.0)
Lymphocytes Relative: 35.9 % (ref 12.0–46.0)
Lymphs Abs: 2.9 10*3/uL (ref 0.7–4.0)
MCHC: 33.1 g/dL (ref 30.0–36.0)
MCV: 87.1 fl (ref 78.0–100.0)
Monocytes Absolute: 0.7 10*3/uL (ref 0.1–1.0)
Monocytes Relative: 8.7 % (ref 3.0–12.0)
Neutro Abs: 4 10*3/uL (ref 1.4–7.7)
Neutrophils Relative %: 49.4 % (ref 43.0–77.0)
Platelets: 277 10*3/uL (ref 150.0–400.0)
RBC: 4.39 Mil/uL (ref 3.87–5.11)
RDW: 15.1 % (ref 11.5–15.5)
WBC: 8.2 10*3/uL (ref 4.0–10.5)

## 2019-07-27 LAB — HEPATIC FUNCTION PANEL
ALT: 17 U/L (ref 0–35)
AST: 19 U/L (ref 0–37)
Albumin: 4 g/dL (ref 3.5–5.2)
Alkaline Phosphatase: 90 U/L (ref 39–117)
Bilirubin, Direct: 0.1 mg/dL (ref 0.0–0.3)
Total Bilirubin: 0.3 mg/dL (ref 0.2–1.2)
Total Protein: 7.7 g/dL (ref 6.0–8.3)

## 2019-07-27 LAB — BASIC METABOLIC PANEL
BUN: 12 mg/dL (ref 6–23)
CO2: 25 mEq/L (ref 19–32)
Calcium: 9.9 mg/dL (ref 8.4–10.5)
Chloride: 103 mEq/L (ref 96–112)
Creatinine, Ser: 1.03 mg/dL (ref 0.40–1.20)
GFR: 63.18 mL/min (ref >60.00)
Glucose, Bld: 99 mg/dL (ref 70–99)
Potassium: 3.8 mEq/L (ref 3.5–5.1)
Sodium: 138 mEq/L (ref 135–145)

## 2019-07-27 LAB — LIPID PANEL
Cholesterol: 201 mg/dL — ABNORMAL HIGH (ref 0–200)
HDL: 57.3 mg/dL (ref >39.00)
LDL Cholesterol: 122 mg/dL — ABNORMAL HIGH (ref 0–99)
NonHDL: 143.47
Total CHOL/HDL Ratio: 4
Triglycerides: 106 mg/dL (ref 0.0–149.0)
VLDL: 21.2 mg/dL (ref 0.0–40.0)

## 2019-07-27 LAB — VITAMIN D 25 HYDROXY (VIT D DEFICIENCY, FRACTURES): VITD: 22.35 ng/mL — ABNORMAL LOW (ref 30.00–100.00)

## 2019-07-27 LAB — TSH: TSH: 2.69 u[IU]/mL (ref 0.35–4.50)

## 2019-07-27 LAB — HEMOGLOBIN A1C: Hgb A1c MFr Bld: 5.7 % (ref 4.6–6.5)

## 2019-07-27 MED ORDER — VITAMIN D (ERGOCALCIFEROL) 1.25 MG (50000 UNIT) PO CAPS: 50000.0000 [IU] | ORAL_CAPSULE | ORAL | 0 refills | Status: DC

## 2019-09-26 ENCOUNTER — Other Ambulatory Visit: Payer: Self-pay | Admitting: Internal Medicine

## 2019-09-26 NOTE — Telephone Encounter (Signed)
Please refill as per office routine med refill policy (all routine meds refilled for 3 mo or monthly per pt preference up to one year from last visit, then month to month grace period for 3 mo, then further med refills will have to be denied)  

## 2019-10-09 ENCOUNTER — Other Ambulatory Visit: Payer: Self-pay | Admitting: Internal Medicine

## 2019-10-09 NOTE — Telephone Encounter (Signed)
Done erx 

## 2019-10-13 ENCOUNTER — Telehealth: Payer: Self-pay | Admitting: Pulmonary Disease

## 2019-10-13 MED ORDER — ALBUTEROL SULFATE HFA 108 (90 BASE) MCG/ACT IN AERS: 1.0000 | INHALATION_SPRAY | Freq: Four times a day (QID) | RESPIRATORY_TRACT | 2 refills | Status: DC | PRN

## 2019-10-13 NOTE — Telephone Encounter (Signed)
Spoke with pateint regarding refill for her ventolon HFA sent in to her pharamcy. Advised patient I sent the ventolin HFA with refill's. Patient is aware of refill. Patient's voice was understanding. Nothing else further needed.

## 2019-10-15 ENCOUNTER — Encounter: Payer: Self-pay | Admitting: Internal Medicine

## 2019-10-16 NOTE — Telephone Encounter (Signed)
No need since pt already has celebrex refills

## 2019-10-19 ENCOUNTER — Encounter: Payer: Self-pay | Admitting: Internal Medicine

## 2019-10-19 ENCOUNTER — Telehealth: Payer: Self-pay | Admitting: Internal Medicine

## 2019-10-19 ENCOUNTER — Telehealth: Payer: Self-pay | Admitting: Family Medicine

## 2019-10-19 MED ORDER — MELOXICAM 15 MG PO TABS
15.0000 mg | ORAL_TABLET | Freq: Every day | ORAL | 3 refills | Status: DC | PRN
Start: 2019-10-19 — End: 2020-08-24

## 2019-10-19 NOTE — Telephone Encounter (Signed)
Ok this is done, and celebrex added to allergy list  This may be due to the sulfa component of the celebrex  Although I did not put this on her allergy list, please let pt know she should probably avoid all sulfa medications in the future due to possible allergy

## 2019-10-19 NOTE — Telephone Encounter (Signed)
Patient called states knee pain continues & request refill on :   naproxen (NAPROSYN) 500 MG tablet [[id number]] DISCONTINUED  Order Details Dose: 500 mg Route: Oral Frequency: 2 times daily PRN  Dispense Quantity: 60 tablet Refills: 0       Sig: Take 1 tablet (500 mg total) by mouth 2 (two) times daily as needed.      Start Date: 03/12/19 End Date: 06/25/19   --Advised pt OV maybe required ( Pt hasn't been seen in over 1 year)  --Pt request Rx refill order sent to :   [id number] [address], [place] Phone:  [phone]  Fax:  [phone]     ---glh

## 2019-10-20 NOTE — Telephone Encounter (Signed)
LDVM for pt of Dr. Gwynn Burly note.

## 2019-11-08 NOTE — Progress Notes (Signed)
Subjective:    Patient ID: Betty Green, female    DOB: 07-Aug-1945, 74 y.o.   MRN: [id number]  HPI The patient is here for an acute visit.   Feet/ankle swelling:    Medications and allergies reviewed with patient and updated if appropriate.  Patient Active Problem List   Diagnosis Date Noted  . Facial pain 03/12/2019  . Tremor 09/10/2018  . Headache 09/10/2018  . Acute sinus infection 06/28/2018  . Greater trochanteric bursitis, left 06/27/2018  . Left leg pain 06/24/2018  . Urinary frequency 10/14/2017  . Acute bilateral low back pain without sciatica 04/26/2017  . Chronic right shoulder pain 03/05/2017  . Osteoporosis 02/10/2017  . Bilateral primary osteoarthritis of knee 08/10/2016  . Vitamin D deficiency 12/21/2015  . Dyspnea 12/14/2015  . Hyperglycemia 12/14/2015  . Sleep paralysis 11/29/2015  . Recurrent acute iridocyclitis of both eyes 12/11/2014  . Polyarthralgia 12/08/2014  . Ankle fracture 06/23/2014  . Morbid obesity ([place]) 03/24/2014  . Peripheral edema 10/20/2012  . Other and unspecified hyperlipidemia 08/04/2012  . Intertrigo 05/03/2012  . Eczema 09/19/2011  . Obstructive sleep apnea 06/04/2011  . Anxiety 05/31/2011  . Insomnia 05/31/2011  . Shoulder pain, bilateral 10/18/2010  . Seasonal and perennial allergic rhinitis   . Allergic asthma   . Depression   . Hypertension   . Arthritis   . Encounter for well adult exam with abnormal findings 10/13/2010    Current Outpatient Medications on File Prior to Visit  Medication Sig Dispense Refill  . albuterol (VENTOLIN HFA) 108 (90 Base) MCG/ACT inhaler Inhale 1-2 puffs into the lungs every 6 (six) hours as needed for wheezing or shortness of breath. 8 g 2  . cetirizine (ZYRTEC) 10 MG tablet Take 1 tablet (10 mg total) by mouth daily. 30 tablet 11  . citalopram (CELEXA) 10 MG tablet TAKE 1 TABLET BY MOUTH EVERY DAY 90 tablet 3  . furosemide (LASIX) 80 MG tablet 1 by mouth daily as needed for swelling 90  tablet 3  . hydrOXYzine (ATARAX/VISTARIL) 25 MG tablet TAKE 1-2 TABS BY MOUTH THREE TIMES PER DAY AS NEEDED FOR ITCHING 540 tablet 1  . Liraglutide -Weight Management (SAXENDA) 18 MG/3ML SOPN 0.5 ml subq weekly 6 mL 5  . meloxicam (MOBIC) 15 MG tablet Take 1 tablet (15 mg total) by mouth daily as needed for pain. 90 tablet 3  . tiZANidine (ZANAFLEX) 2 MG tablet Take 1 tablet (2 mg total) by mouth every 6 (six) hours as needed for muscle spasms. 50 tablet 2  . traMADol (ULTRAM) 50 MG tablet TAKE 1 TABLET (50 MG TOTAL) BY MOUTH 4 (FOUR) TIMES DAILY AS NEEDED. 120 tablet 2  . Vitamin D, Ergocalciferol, (DRISDOL) 1.25 MG (50000 UNIT) CAPS capsule Take 1 capsule (50,000 Units total) by mouth every 7 (seven) days. 12 capsule 0   No current facility-administered medications on file prior to visit.    Past Medical History:  Diagnosis Date  . Allergy   . Anxiety    "take RX prn"  . Arthritis    "all over"  . Asthma   . Chronic bronchitis ([place])   . GERD (gastroesophageal reflux disease)   . Hypertension   . Insomnia 05/31/2011  . OSA on CPAP    wears CPAP sometimes (06/23/2014)  . Other and unspecified hyperlipidemia 08/04/2012  . PONV (postoperative nausea and vomiting)   . Shortness of breath dyspnea    with exertion  . Shoulder pain, bilateral 10/18/2010  . Vitamin  D deficiency 12/21/2015    Past Surgical History:  Procedure Laterality Date  . APPENDECTOMY  1966  . BREAST EXCISIONAL BIOPSY Right   . CHOLECYSTECTOMY OPEN  1981  . FRACTURE SURGERY    . ORIF ANKLE FRACTURE Right 06/23/2014  . ORIF ANKLE FRACTURE Right 06/23/2014   Procedure: Take Down Malunion Right Ankle, Open Reduction Internal Fixation Right Ankle, Possible Syndesmosis Repair, Medial Joint Debridement;  Surgeon: Newt Minion, MD;  Location: [facility];  Service: Orthopedics;  Laterality: Right;  . TONSILLECTOMY  1963  . TOTAL ABDOMINAL HYSTERECTOMY  1980   w/BSO    Social History   Socioeconomic History  . Marital  status: Single    Spouse name: Not on file  . Number of children: 2  . Years of education: 14  . Highest education level: Not on file  Occupational History  . Occupation: retired    Fish farm manager: [employer]  Tobacco Use  . Smoking status: Former Smoker    Packs/day: 0.10    Years: 2.00    Pack years: 0.20    Types: Cigarettes    Quit date: 02/06/1984    Years since quitting: 35.7  . Smokeless tobacco: Never Used  Substance and Sexual Activity  . Alcohol use: No  . Drug use: No  . Sexual activity: Not Currently  Other Topics Concern  . Not on file  Social History Narrative  . Not on file   Social Determinants of Health   Financial Resource Strain:   . Difficulty of Paying Living Expenses: Not on file  Food Insecurity:   . Worried About Charity fundraiser in the Last Year: Not on file  . Ran Out of Food in the Last Year: Not on file  Transportation Needs:   . Lack of Transportation (Medical): Not on file  . Lack of Transportation (Non-Medical): Not on file  Physical Activity:   . Days of Exercise per Week: Not on file  . Minutes of Exercise per Session: Not on file  Stress:   . Feeling of Stress : Not on file  Social Connections:   . Frequency of Communication with Friends and Family: Not on file  . Frequency of Social Gatherings with Friends and Family: Not on file  . Attends Religious Services: Not on file  . Active Member of Clubs or Organizations: Not on file  . Attends Archivist Meetings: Not on file  . Marital Status: Not on file    Family History  Problem Relation Age of Onset  . Arthritis Other   . Arthritis Mother   . Heart disease Mother   . Hypertension Mother   . Arthritis Father   . Heart disease Father   . Hypertension Father   . Cancer Other        colon and prostate cancer    Review of Systems     Objective:  There were no vitals filed for this visit. BP Readings from Last 3 Encounters:  06/25/19 140/86  09/10/18  140/88  06/27/18 126/82   Wt Readings from Last 3 Encounters:  06/25/19 288 lb (130.6 kg)  09/10/18 289 lb (131.1 kg)  06/27/18 294 lb (133.4 kg)   There is no height or weight on file to calculate BMI.   Physical Exam         Assessment & Plan:    See Problem List for Assessment and Plan of chronic medical problems.    This visit occurred during the SARS-CoV-2 public  health emergency.  Safety protocols were in place, including screening questions prior to the visit, additional usage of staff PPE, and extensive cleaning of exam room while observing appropriate contact time as indicated for disinfecting solutions.   This encounter was created in error - please disregard.

## 2019-11-08 NOTE — Patient Instructions (Signed)
  Blood work was ordered.     Medications reviewed and updated.  Changes include :     Your prescription(s) have been submitted to your pharmacy. Please take as directed and contact our office if you believe you are having problem(s) with the medication(s).

## 2019-11-09 ENCOUNTER — Encounter: Payer: Medicare HMO | Admitting: Internal Medicine

## 2019-11-10 NOTE — Progress Notes (Deleted)
Subjective:    Patient ID: Betty Green, female    DOB: 1945/04/04, 74 y.o.   MRN: [id number]  HPI The patient is here for an acute visit.   Feet/ankle swelling:    Medications and allergies reviewed with patient and updated if appropriate.  Patient Active Problem List   Diagnosis Date Noted  . Facial pain 03/12/2019  . Tremor 09/10/2018  . Headache 09/10/2018  . Acute sinus infection 06/28/2018  . Greater trochanteric bursitis, left 06/27/2018  . Left leg pain 06/24/2018  . Urinary frequency 10/14/2017  . Acute bilateral low back pain without sciatica 04/26/2017  . Chronic right shoulder pain 03/05/2017  . Osteoporosis 02/10/2017  . Bilateral primary osteoarthritis of knee 08/10/2016  . Vitamin D deficiency 12/21/2015  . Dyspnea 12/14/2015  . Hyperglycemia 12/14/2015  . Sleep paralysis 11/29/2015  . Recurrent acute iridocyclitis of both eyes 12/11/2014  . Polyarthralgia 12/08/2014  . Ankle fracture 06/23/2014  . Morbid obesity ([place]) 03/24/2014  . Peripheral edema 10/20/2012  . Other and unspecified hyperlipidemia 08/04/2012  . Intertrigo 05/03/2012  . Eczema 09/19/2011  . Obstructive sleep apnea 06/04/2011  . Anxiety 05/31/2011  . Insomnia 05/31/2011  . Shoulder pain, bilateral 10/18/2010  . Seasonal and perennial allergic rhinitis   . Allergic asthma   . Depression   . Hypertension   . Arthritis   . Encounter for well adult exam with abnormal findings 10/13/2010    Current Outpatient Medications on File Prior to Visit  Medication Sig Dispense Refill  . albuterol (VENTOLIN HFA) 108 (90 Base) MCG/ACT inhaler Inhale 1-2 puffs into the lungs every 6 (six) hours as needed for wheezing or shortness of breath. 8 g 2  . cetirizine (ZYRTEC) 10 MG tablet Take 1 tablet (10 mg total) by mouth daily. 30 tablet 11  . citalopram (CELEXA) 10 MG tablet TAKE 1 TABLET BY MOUTH EVERY DAY 90 tablet 3  . furosemide (LASIX) 80 MG tablet 1 by mouth daily as needed for swelling 90  tablet 3  . hydrOXYzine (ATARAX/VISTARIL) 25 MG tablet TAKE 1-2 TABS BY MOUTH THREE TIMES PER DAY AS NEEDED FOR ITCHING 540 tablet 1  . Liraglutide -Weight Management (SAXENDA) 18 MG/3ML SOPN 0.5 ml subq weekly 6 mL 5  . meloxicam (MOBIC) 15 MG tablet Take 1 tablet (15 mg total) by mouth daily as needed for pain. 90 tablet 3  . tiZANidine (ZANAFLEX) 2 MG tablet Take 1 tablet (2 mg total) by mouth every 6 (six) hours as needed for muscle spasms. 50 tablet 2  . traMADol (ULTRAM) 50 MG tablet TAKE 1 TABLET (50 MG TOTAL) BY MOUTH 4 (FOUR) TIMES DAILY AS NEEDED. 120 tablet 2  . Vitamin D, Ergocalciferol, (DRISDOL) 1.25 MG (50000 UNIT) CAPS capsule Take 1 capsule (50,000 Units total) by mouth every 7 (seven) days. 12 capsule 0   No current facility-administered medications on file prior to visit.    Past Medical History:  Diagnosis Date  . Allergy   . Anxiety    "take RX prn"  . Arthritis    "all over"  . Asthma   . Chronic bronchitis ([place])   . GERD (gastroesophageal reflux disease)   . Hypertension   . Insomnia 05/31/2011  . OSA on CPAP    wears CPAP sometimes (06/23/2014)  . Other and unspecified hyperlipidemia 08/04/2012  . PONV (postoperative nausea and vomiting)   . Shortness of breath dyspnea    with exertion  . Shoulder pain, bilateral 10/18/2010  . Vitamin  D deficiency 12/21/2015    Past Surgical History:  Procedure Laterality Date  . APPENDECTOMY  1966  . BREAST EXCISIONAL BIOPSY Right   . CHOLECYSTECTOMY OPEN  1981  . FRACTURE SURGERY    . ORIF ANKLE FRACTURE Right 06/23/2014  . ORIF ANKLE FRACTURE Right 06/23/2014   Procedure: Take Down Malunion Right Ankle, Open Reduction Internal Fixation Right Ankle, Possible Syndesmosis Repair, Medial Joint Debridement;  Surgeon: Newt Minion, MD;  Location: [facility];  Service: Orthopedics;  Laterality: Right;  . TONSILLECTOMY  1963  . TOTAL ABDOMINAL HYSTERECTOMY  1980   w/BSO    Social History   Socioeconomic History  . Marital  status: Single    Spouse name: Not on file  . Number of children: 2  . Years of education: 69  . Highest education level: Not on file  Occupational History  . Occupation: retired    Fish farm manager: [employer]  Tobacco Use  . Smoking status: Former Smoker    Packs/day: 0.10    Years: 2.00    Pack years: 0.20    Types: Cigarettes    Quit date: 02/06/1984    Years since quitting: 35.7  . Smokeless tobacco: Never Used  Substance and Sexual Activity  . Alcohol use: No  . Drug use: No  . Sexual activity: Not Currently  Other Topics Concern  . Not on file  Social History Narrative  . Not on file   Social Determinants of Health   Financial Resource Strain:   . Difficulty of Paying Living Expenses: Not on file  Food Insecurity:   . Worried About Charity fundraiser in the Last Year: Not on file  . Ran Out of Food in the Last Year: Not on file  Transportation Needs:   . Lack of Transportation (Medical): Not on file  . Lack of Transportation (Non-Medical): Not on file  Physical Activity:   . Days of Exercise per Week: Not on file  . Minutes of Exercise per Session: Not on file  Stress:   . Feeling of Stress : Not on file  Social Connections:   . Frequency of Communication with Friends and Family: Not on file  . Frequency of Social Gatherings with Friends and Family: Not on file  . Attends Religious Services: Not on file  . Active Member of Clubs or Organizations: Not on file  . Attends Archivist Meetings: Not on file  . Marital Status: Not on file    Family History  Problem Relation Age of Onset  . Arthritis Other   . Arthritis Mother   . Heart disease Mother   . Hypertension Mother   . Arthritis Father   . Heart disease Father   . Hypertension Father   . Cancer Other        colon and prostate cancer    Review of Systems     Objective:  There were no vitals filed for this visit. BP Readings from Last 3 Encounters:  06/25/19 140/86  09/10/18  140/88  06/27/18 126/82   Wt Readings from Last 3 Encounters:  06/25/19 288 lb (130.6 kg)  09/10/18 289 lb (131.1 kg)  06/27/18 294 lb (133.4 kg)   There is no height or weight on file to calculate BMI.   Physical Exam    Constitutional: Appears well-developed and well-nourished. No distress.  Head: Normocephalic and atraumatic.  Neck: Neck supple. No tracheal deviation present. No thyromegaly present.  No cervical lymphadenopathy Cardiovascular: Normal rate, regular rhythm  and normal heart sounds.  No murmur heard. No carotid bruit .  No edema Pulmonary/Chest: Effort normal and breath sounds normal. No respiratory distress. No has no wheezes. No rales.  Skin: Skin is warm and dry. Not diaphoretic.  Psychiatric: Normal mood and affect. Behavior is normal.        Assessment & Plan:    See Problem List for Assessment and Plan of chronic medical problems.    This visit occurred during the SARS-CoV-2 public health emergency.  Safety protocols were in place, including screening questions prior to the visit, additional usage of staff PPE, and extensive cleaning of exam room while observing appropriate contact time as indicated for disinfecting solutions.

## 2019-11-11 ENCOUNTER — Ambulatory Visit: Payer: Medicare HMO | Admitting: Internal Medicine

## 2019-11-12 DIAGNOSIS — M25761 Osteophyte, right knee: Secondary | ICD-10-CM | POA: Diagnosis not present

## 2019-11-12 DIAGNOSIS — M25762 Osteophyte, left knee: Secondary | ICD-10-CM | POA: Diagnosis not present

## 2019-11-12 DIAGNOSIS — M1712 Unilateral primary osteoarthritis, left knee: Secondary | ICD-10-CM | POA: Diagnosis not present

## 2019-11-12 DIAGNOSIS — M1711 Unilateral primary osteoarthritis, right knee: Secondary | ICD-10-CM | POA: Diagnosis not present

## 2019-11-17 DIAGNOSIS — M1712 Unilateral primary osteoarthritis, left knee: Secondary | ICD-10-CM | POA: Diagnosis not present

## 2019-11-18 ENCOUNTER — Encounter: Payer: Self-pay | Admitting: Internal Medicine

## 2019-11-18 ENCOUNTER — Other Ambulatory Visit: Payer: Self-pay

## 2019-11-18 ENCOUNTER — Ambulatory Visit (INDEPENDENT_AMBULATORY_CARE_PROVIDER_SITE_OTHER): Payer: Medicare HMO | Admitting: Internal Medicine

## 2019-11-18 VITALS — BP 140/90 | HR 104 | Temp 98.4°F | Ht 62.0 in | Wt 281.0 lb

## 2019-11-18 DIAGNOSIS — R739 Hyperglycemia, unspecified: Secondary | ICD-10-CM | POA: Diagnosis not present

## 2019-11-18 DIAGNOSIS — E7849 Other hyperlipidemia: Secondary | ICD-10-CM | POA: Diagnosis not present

## 2019-11-18 DIAGNOSIS — Z23 Encounter for immunization: Secondary | ICD-10-CM

## 2019-11-18 DIAGNOSIS — Z6841 Body Mass Index (BMI) 40.0 and over, adult: Secondary | ICD-10-CM | POA: Diagnosis not present

## 2019-11-18 DIAGNOSIS — I10 Essential (primary) hypertension: Secondary | ICD-10-CM

## 2019-11-18 DIAGNOSIS — E559 Vitamin D deficiency, unspecified: Secondary | ICD-10-CM

## 2019-11-18 DIAGNOSIS — R609 Edema, unspecified: Secondary | ICD-10-CM | POA: Diagnosis not present

## 2019-11-18 MED ORDER — FUROSEMIDE 80 MG PO TABS: ORAL_TABLET | ORAL | 3 refills | Status: DC

## 2019-11-18 MED ORDER — ROSUVASTATIN CALCIUM 20 MG PO TABS: 20.0000 mg | ORAL_TABLET | Freq: Every day | ORAL | 3 refills | Status: DC

## 2019-11-18 NOTE — Patient Instructions (Addendum)
Please remember to have your  Booster shot for covid after Nov 17, 2019  You had the flu shot today  You are given the prescription for the compression stockings to take to a Medical Supply store such as [facility] on [address]  Please take OTC Vitamin D3 at 2000 units per day, indefinitely.  Please take all new medication as prescribed - the generic crestor 20 mg per day  Please continue all other medications as before, including the lasix as needed  Please have the pharmacy call with any other refills you may need.  Please continue your efforts at being more active, low cholesterol diet, and weight control.  Please keep your appointments with your specialists as you may have planned  Please make an Appointment to return in 3 months

## 2019-11-18 NOTE — Progress Notes (Signed)
Subjective:    Patient ID: Betty Green, female    DOB: 1946-02-04, 74 y.o.   MRN: [id number]  HPI  Here to f/u; overall doing ok,  Pt denies chest pain, increasing sob or doe, wheezing, orthopnea, pnd, palpitations, dizziness or syncope. Has also leg swelling persistent now in the am, but also not taking the lasix.  Pt denies new neurological symptoms such as new headache, or facial or extremity weakness or numbness.  Pt denies polydipsia, polyuria, or low sugar episode.  Pt states overall good compliance with meds.  S/p gel shot to left knee yesterday as pain center in [place], and to f/u tomorrow for same injection for right knee.  Has not yet started vit d.  Trying to follow lower chol diet but ldl still elevated. Due for flu shot Past Medical History:  Diagnosis Date  . Allergy   . Anxiety    "take RX prn"  . Arthritis    "all over"  . Asthma   . Chronic bronchitis ([place])   . GERD (gastroesophageal reflux disease)   . Hypertension   . Insomnia 05/31/2011  . OSA on CPAP    wears CPAP sometimes (06/23/2014)  . Other and unspecified hyperlipidemia 08/04/2012  . PONV (postoperative nausea and vomiting)   . Shortness of breath dyspnea    with exertion  . Shoulder pain, bilateral 10/18/2010  . Vitamin D deficiency 12/21/2015   Past Surgical History:  Procedure Laterality Date  . APPENDECTOMY  1966  . BREAST EXCISIONAL BIOPSY Right   . CHOLECYSTECTOMY OPEN  1981  . FRACTURE SURGERY    . ORIF ANKLE FRACTURE Right 06/23/2014  . ORIF ANKLE FRACTURE Right 06/23/2014   Procedure: Take Down Malunion Right Ankle, Open Reduction Internal Fixation Right Ankle, Possible Syndesmosis Repair, Medial Joint Debridement;  Surgeon: Newt Minion, MD;  Location: [facility];  Service: Orthopedics;  Laterality: Right;  . TONSILLECTOMY  1963  . TOTAL ABDOMINAL HYSTERECTOMY  1980   w/BSO    reports that she quit smoking about 35 years ago. Her smoking use included cigarettes. She has a 0.20 pack-year smoking  history. She has never used smokeless tobacco. She reports that she does not drink alcohol and does not use drugs. family history includes Arthritis in her father, mother, and another family member; Cancer in an other family member; Heart disease in her father and mother; Hypertension in her father and mother. Allergies  Allergen Reactions  . Amoxicillin-Pot Clavulanate     Hives  . Azithromycin Other (See Comments)    abd pain  . Celebrex [Celecoxib] Itching  . Lipitor [Atorvastatin] Itching   Current Outpatient Medications on File Prior to Visit  Medication Sig Dispense Refill  . albuterol (VENTOLIN HFA) 108 (90 Base) MCG/ACT inhaler Inhale 1-2 puffs into the lungs every 6 (six) hours as needed for wheezing or shortness of breath. 8 g 2  . citalopram (CELEXA) 10 MG tablet TAKE 1 TABLET BY MOUTH EVERY DAY 90 tablet 3  . hydrOXYzine (ATARAX/VISTARIL) 25 MG tablet TAKE 1-2 TABS BY MOUTH THREE TIMES PER DAY AS NEEDED FOR ITCHING 540 tablet 1  . Liraglutide -Weight Management (SAXENDA) 18 MG/3ML SOPN 0.5 ml subq weekly 6 mL 5  . meloxicam (MOBIC) 15 MG tablet Take 1 tablet (15 mg total) by mouth daily as needed for pain. 90 tablet 3  . tiZANidine (ZANAFLEX) 2 MG tablet Take 1 tablet (2 mg total) by mouth every 6 (six) hours as needed for muscle spasms. [place]  tablet 2  . traMADol (ULTRAM) 50 MG tablet TAKE 1 TABLET (50 MG TOTAL) BY MOUTH 4 (FOUR) TIMES DAILY AS NEEDED. 120 tablet 2   No current facility-administered medications on file prior to visit.   Review of Systems All otherwise neg per pt    Objective:   Physical Exam BP 140/90 (BP Location: Left Arm, Patient Position: Sitting, Cuff Size: Large)   Pulse (!) 104   Temp 98.4 F (36.9 C) (Oral)   Ht 5\' 2"  (1.575 m)   Wt 281 lb (127.5 kg)   SpO2 96%   BMI 51.40 kg/m  VS noted,  Constitutional: Pt appears in NAD HENT: Head: NCAT.  Right Ear: External ear normal.  Left Ear: External ear normal.  Eyes: . Pupils are equal, round,  and reactive to light. Conjunctivae and EOM are normal Nose: without d/c or deformity Neck: Neck supple. Gross normal ROM Cardiovascular: Normal rate and regular rhythm.   Pulmonary/Chest: Effort normal and breath sounds without rales or wheezing.  Abd:  Soft, NT, ND, + BS, no organomegaly Neurological: Pt is alert. At baseline orientation, motor grossly intact Skin: Skin is warm. No rashes, other new lesions, 1+ bilat LE edema Psychiatric: Pt behavior is normal without agitation  All otherwise neg per pt Lab Results  Component Value Date   WBC 8.2 07/27/2019   HGB 12.7 07/27/2019   HCT 38.3 07/27/2019   PLT 277.0 07/27/2019   GLUCOSE 99 07/27/2019   CHOL 201 (H) 07/27/2019   TRIG 106.0 07/27/2019   HDL 57.30 07/27/2019   LDLDIRECT 134.1 05/02/2012   LDLCALC 122 (H) 07/27/2019   ALT 17 07/27/2019   AST 19 07/27/2019   NA 138 07/27/2019   K 3.8 07/27/2019   CL 103 07/27/2019   CREATININE 1.03 07/27/2019   BUN 12 07/27/2019   CO2 25 07/27/2019   TSH 2.69 07/27/2019   INR 1.05 06/22/2014   HGBA1C 5.7 07/27/2019      Assessment & Plan:

## 2019-11-19 DIAGNOSIS — M1711 Unilateral primary osteoarthritis, right knee: Secondary | ICD-10-CM | POA: Diagnosis not present

## 2019-11-23 DIAGNOSIS — M1712 Unilateral primary osteoarthritis, left knee: Secondary | ICD-10-CM | POA: Diagnosis not present

## 2019-11-25 ENCOUNTER — Encounter: Payer: Self-pay | Admitting: Internal Medicine

## 2019-11-25 NOTE — Assessment & Plan Note (Addendum)
Ok to restart lasix prn, compression stockings, leg elevation, low salt diet, wt loss  I spent 31 minutes in preparing to see the patient by review of recent labs, imaging and procedures, obtaining and reviewing separately obtained history, communicating with the patient and family or caregiver, ordering medications, tests or procedures, and documenting clinical information in the EHR including the differential Dx, treatment, and any further evaluation and other management of edema, hld, hyperglycemia, htn, vit d def

## 2019-11-25 NOTE — Assessment & Plan Note (Signed)
Uncontrolled, to start crestor 20 qd

## 2019-11-25 NOTE — Assessment & Plan Note (Signed)
To start vit d 2000 qd

## 2019-11-25 NOTE — Assessment & Plan Note (Signed)
stable overall by history and exam, recent data reviewed with pt, and pt to continue medical treatment as before,  to f/u any worsening symptoms or concerns  

## 2019-11-26 ENCOUNTER — Encounter: Payer: Self-pay | Admitting: Internal Medicine

## 2019-11-26 DIAGNOSIS — M1711 Unilateral primary osteoarthritis, right knee: Secondary | ICD-10-CM | POA: Diagnosis not present

## 2019-12-14 DIAGNOSIS — M1712 Unilateral primary osteoarthritis, left knee: Secondary | ICD-10-CM | POA: Diagnosis not present

## 2019-12-15 DIAGNOSIS — Z79899 Other long term (current) drug therapy: Secondary | ICD-10-CM | POA: Diagnosis not present

## 2019-12-15 DIAGNOSIS — H35033 Hypertensive retinopathy, bilateral: Secondary | ICD-10-CM | POA: Diagnosis not present

## 2019-12-15 DIAGNOSIS — H3581 Retinal edema: Secondary | ICD-10-CM | POA: Diagnosis not present

## 2019-12-15 DIAGNOSIS — H2513 Age-related nuclear cataract, bilateral: Secondary | ICD-10-CM | POA: Diagnosis not present

## 2019-12-15 DIAGNOSIS — H35063 Retinal vasculitis, bilateral: Secondary | ICD-10-CM | POA: Diagnosis not present

## 2019-12-15 DIAGNOSIS — H30033 Focal chorioretinal inflammation, peripheral, bilateral: Secondary | ICD-10-CM | POA: Diagnosis not present

## 2019-12-15 DIAGNOSIS — H16002 Unspecified corneal ulcer, left eye: Secondary | ICD-10-CM | POA: Diagnosis not present

## 2019-12-15 DIAGNOSIS — I10 Essential (primary) hypertension: Secondary | ICD-10-CM | POA: Diagnosis not present

## 2019-12-21 ENCOUNTER — Telehealth: Payer: Self-pay | Admitting: Internal Medicine

## 2019-12-21 NOTE — Telephone Encounter (Signed)
    Patient calling to report knee pain. She is requesting early refill for Mobic She is out of medication, she states she is taking 2 tablets daily   please advise

## 2019-12-21 NOTE — Telephone Encounter (Signed)
Sent to Dr. John. 

## 2019-12-21 NOTE — Telephone Encounter (Signed)
Please ask pt to check her bottle, as the last rx from [facility] was the 15 mg daily prn dose

## 2019-12-22 NOTE — Telephone Encounter (Signed)
Called pt to inform her of Dr. Gwynn Burly suggestion. VM was full I was unable to lvm for pt.

## 2019-12-30 ENCOUNTER — Telehealth: Payer: Self-pay | Admitting: Internal Medicine

## 2019-12-30 MED ORDER — CARBAMAZEPINE ER 100 MG PO TB12: 100.0000 mg | ORAL_TABLET | Freq: Two times a day (BID) | ORAL | 5 refills | Status: DC

## 2019-12-30 NOTE — Telephone Encounter (Signed)
Sent to Dr. John. 

## 2019-12-30 NOTE — Telephone Encounter (Signed)
Patient states she is having TMJ symptoms again and last time Dr. Jenny Reichmann sent her in some pain medication and she is wondering if he could do that for her again. Patient denies appointment at this time and also does not know the name of medication. Patient # [id number] [id number] [address], [place] Phone:  [phone]  Fax:  [phone]

## 2019-12-30 NOTE — Telephone Encounter (Signed)
Done erx - tegretol

## 2020-01-21 ENCOUNTER — Other Ambulatory Visit: Payer: Self-pay | Admitting: Internal Medicine

## 2020-01-21 NOTE — Telephone Encounter (Signed)
Done erx 

## 2020-01-25 DIAGNOSIS — Z79899 Other long term (current) drug therapy: Secondary | ICD-10-CM | POA: Diagnosis not present

## 2020-01-25 DIAGNOSIS — H30033 Focal chorioretinal inflammation, peripheral, bilateral: Secondary | ICD-10-CM | POA: Diagnosis not present

## 2020-02-11 ENCOUNTER — Telehealth: Payer: Self-pay | Admitting: Internal Medicine

## 2020-02-11 NOTE — Telephone Encounter (Signed)
Patient wondering if she can get a referral to rheumatology for her osteo arthritis, states the pain has been getting worse.  Please give a patient a call and let her know what he decides. [phone]

## 2020-02-12 NOTE — Telephone Encounter (Signed)
Yes, we could but there is little need, as osteoarthritis is not an inflammatory arthritis like rheumatoid or lupus, and is only treated the same way that primary care, sports medicine and orthopedic would treat, except they do no surgury, and do even less cortisone injections than sports medicine, so I am not sure that this would be of more benefit that what she is doing now.  Another option is to be referred to a pain clinic if she feels pain is the main issue, but this is usually done if there is little else the sports medicine or orthopedic can do

## 2020-02-16 DIAGNOSIS — H35063 Retinal vasculitis, bilateral: Secondary | ICD-10-CM | POA: Diagnosis not present

## 2020-02-16 DIAGNOSIS — I10 Essential (primary) hypertension: Secondary | ICD-10-CM | POA: Diagnosis not present

## 2020-02-16 DIAGNOSIS — Z79899 Other long term (current) drug therapy: Secondary | ICD-10-CM | POA: Diagnosis not present

## 2020-02-16 DIAGNOSIS — H35033 Hypertensive retinopathy, bilateral: Secondary | ICD-10-CM | POA: Diagnosis not present

## 2020-02-16 DIAGNOSIS — H3581 Retinal edema: Secondary | ICD-10-CM | POA: Diagnosis not present

## 2020-02-16 DIAGNOSIS — H16002 Unspecified corneal ulcer, left eye: Secondary | ICD-10-CM | POA: Diagnosis not present

## 2020-02-16 DIAGNOSIS — H2513 Age-related nuclear cataract, bilateral: Secondary | ICD-10-CM | POA: Diagnosis not present

## 2020-02-16 DIAGNOSIS — H30033 Focal chorioretinal inflammation, peripheral, bilateral: Secondary | ICD-10-CM | POA: Diagnosis not present

## 2020-02-25 ENCOUNTER — Encounter: Payer: Self-pay | Admitting: Internal Medicine

## 2020-02-25 DIAGNOSIS — M1990 Unspecified osteoarthritis, unspecified site: Secondary | ICD-10-CM

## 2020-03-29 DIAGNOSIS — H2513 Age-related nuclear cataract, bilateral: Secondary | ICD-10-CM | POA: Diagnosis not present

## 2020-03-29 DIAGNOSIS — H35063 Retinal vasculitis, bilateral: Secondary | ICD-10-CM | POA: Diagnosis not present

## 2020-03-29 DIAGNOSIS — Z79899 Other long term (current) drug therapy: Secondary | ICD-10-CM | POA: Diagnosis not present

## 2020-03-29 DIAGNOSIS — H3581 Retinal edema: Secondary | ICD-10-CM | POA: Diagnosis not present

## 2020-03-29 DIAGNOSIS — H30033 Focal chorioretinal inflammation, peripheral, bilateral: Secondary | ICD-10-CM | POA: Diagnosis not present

## 2020-03-29 DIAGNOSIS — H16002 Unspecified corneal ulcer, left eye: Secondary | ICD-10-CM | POA: Diagnosis not present

## 2020-03-29 DIAGNOSIS — H35033 Hypertensive retinopathy, bilateral: Secondary | ICD-10-CM | POA: Diagnosis not present

## 2020-04-18 ENCOUNTER — Other Ambulatory Visit: Payer: Self-pay | Admitting: Internal Medicine

## 2020-04-18 DIAGNOSIS — M25511 Pain in right shoulder: Secondary | ICD-10-CM | POA: Diagnosis not present

## 2020-04-18 DIAGNOSIS — M25561 Pain in right knee: Secondary | ICD-10-CM | POA: Diagnosis not present

## 2020-04-18 DIAGNOSIS — M25562 Pain in left knee: Secondary | ICD-10-CM | POA: Diagnosis not present

## 2020-04-18 DIAGNOSIS — M2550 Pain in unspecified joint: Secondary | ICD-10-CM | POA: Diagnosis not present

## 2020-04-18 DIAGNOSIS — M25512 Pain in left shoulder: Secondary | ICD-10-CM | POA: Diagnosis not present

## 2020-05-09 ENCOUNTER — Ambulatory Visit: Payer: Medicare HMO

## 2020-06-06 ENCOUNTER — Other Ambulatory Visit: Payer: Self-pay

## 2020-06-06 ENCOUNTER — Other Ambulatory Visit: Payer: Self-pay | Admitting: Internal Medicine

## 2020-06-06 ENCOUNTER — Encounter: Payer: Self-pay | Admitting: Internal Medicine

## 2020-06-06 ENCOUNTER — Ambulatory Visit (INDEPENDENT_AMBULATORY_CARE_PROVIDER_SITE_OTHER): Payer: Medicare Other | Admitting: Internal Medicine

## 2020-06-06 VITALS — BP 140/86 | HR 100 | Temp 98.7°F | Ht 62.0 in | Wt 289.0 lb

## 2020-06-06 DIAGNOSIS — Z0001 Encounter for general adult medical examination with abnormal findings: Secondary | ICD-10-CM | POA: Diagnosis not present

## 2020-06-06 DIAGNOSIS — R609 Edema, unspecified: Secondary | ICD-10-CM

## 2020-06-06 DIAGNOSIS — E559 Vitamin D deficiency, unspecified: Secondary | ICD-10-CM

## 2020-06-06 DIAGNOSIS — I10 Essential (primary) hypertension: Secondary | ICD-10-CM

## 2020-06-06 DIAGNOSIS — E7849 Other hyperlipidemia: Secondary | ICD-10-CM | POA: Diagnosis not present

## 2020-06-06 DIAGNOSIS — R739 Hyperglycemia, unspecified: Secondary | ICD-10-CM | POA: Diagnosis not present

## 2020-06-06 DIAGNOSIS — E538 Deficiency of other specified B group vitamins: Secondary | ICD-10-CM

## 2020-06-06 LAB — CBC WITH DIFFERENTIAL/PLATELET
Basophils Absolute: 0 10*3/uL (ref 0.0–0.1)
Basophils Relative: 0.7 % (ref 0.0–3.0)
Eosinophils Absolute: 0.2 10*3/uL (ref 0.0–0.7)
Eosinophils Relative: 3.5 % (ref 0.0–5.0)
HCT: 41.1 % (ref 36.0–46.0)
Hemoglobin: 13.7 g/dL (ref 12.0–15.0)
Lymphocytes Relative: 30.6 % (ref 12.0–46.0)
Lymphs Abs: 2 10*3/uL (ref 0.7–4.0)
MCHC: 33.3 g/dL (ref 30.0–36.0)
MCV: 88.7 fl (ref 78.0–100.0)
Monocytes Absolute: 0.5 10*3/uL (ref 0.1–1.0)
Monocytes Relative: 8.1 % (ref 3.0–12.0)
Neutro Abs: 3.7 10*3/uL (ref 1.4–7.7)
Neutrophils Relative %: 57.1 % (ref 43.0–77.0)
Platelets: 297 10*3/uL (ref 150.0–400.0)
RBC: 4.64 Mil/uL (ref 3.87–5.11)
RDW: 16 % — ABNORMAL HIGH (ref 11.5–15.5)
WBC: 6.5 10*3/uL (ref 4.0–10.5)

## 2020-06-06 LAB — VITAMIN B12: Vitamin B-12: 1087 pg/mL — ABNORMAL HIGH (ref 211–911)

## 2020-06-06 LAB — BASIC METABOLIC PANEL
BUN: 16 mg/dL (ref 6–23)
CO2: 30 mEq/L (ref 19–32)
Calcium: 10.3 mg/dL (ref 8.4–10.5)
Chloride: 103 mEq/L (ref 96–112)
Creatinine, Ser: 0.97 mg/dL (ref 0.40–1.20)
GFR: 56.96 mL/min — ABNORMAL LOW (ref >60.00)
Glucose, Bld: 106 mg/dL — ABNORMAL HIGH (ref 70–99)
Potassium: 4.9 mEq/L (ref 3.5–5.1)
Sodium: 141 mEq/L (ref 135–145)

## 2020-06-06 LAB — HEMOGLOBIN A1C: Hgb A1c MFr Bld: 6.1 % (ref 4.6–6.5)

## 2020-06-06 LAB — HEPATIC FUNCTION PANEL
ALT: 13 U/L (ref 0–35)
AST: 19 U/L (ref 0–37)
Albumin: 4.1 g/dL (ref 3.5–5.2)
Alkaline Phosphatase: 96 U/L (ref 39–117)
Bilirubin, Direct: 0.1 mg/dL (ref 0.0–0.3)
Total Bilirubin: 0.4 mg/dL (ref 0.2–1.2)
Total Protein: 8.2 g/dL (ref 6.0–8.3)

## 2020-06-06 LAB — LIPID PANEL
Cholesterol: 239 mg/dL — ABNORMAL HIGH (ref 0–200)
HDL: 67.9 mg/dL (ref >39.00)
LDL Cholesterol: 149 mg/dL — ABNORMAL HIGH (ref 0–99)
NonHDL: 170.98
Total CHOL/HDL Ratio: 4
Triglycerides: 112 mg/dL (ref 0.0–149.0)
VLDL: 22.4 mg/dL (ref 0.0–40.0)

## 2020-06-06 LAB — VITAMIN D 25 HYDROXY (VIT D DEFICIENCY, FRACTURES): VITD: 26.11 ng/mL — ABNORMAL LOW (ref 30.00–100.00)

## 2020-06-06 LAB — TSH: TSH: 1.57 u[IU]/mL (ref 0.35–4.50)

## 2020-06-06 MED ORDER — FUROSEMIDE 80 MG PO TABS: ORAL_TABLET | ORAL | 3 refills | Status: DC

## 2020-06-06 MED ORDER — LIRAGLUTIDE 18 MG/3ML ~~LOC~~ SOPN: 1.8000 mg | PEN_INJECTOR | Freq: Every day | SUBCUTANEOUS | 11 refills | Status: DC

## 2020-06-06 MED ORDER — THERA-D 4000 100 MCG (4000 UT) PO TABS: ORAL_TABLET | ORAL | 99 refills | Status: DC

## 2020-06-06 MED ORDER — ROSUVASTATIN CALCIUM 40 MG PO TABS: 40.0000 mg | ORAL_TABLET | Freq: Every day | ORAL | 3 refills | Status: DC

## 2020-06-06 NOTE — Progress Notes (Signed)
Patient ID: Betty Green, female   DOB: 1945-08-22, 75 y.o.   MRN: [id number]         Chief Complaint:: wellness exam and Foot Swelling (Both feet and ankles are swollen. Pt states she is not in pain, but would like the swelling to go down, started week ago.)  , hyperglyemia, low vit d and b12, and obesity       HPI:  Betty Green is a 75 y.o. female here for wellness exam; will be due for tdap later this year but o/w up to date with preventive referrals and immunizations                        Also unable to lose wt with diet and exercise for many years, asking for med like victoza.  Taking Vit D and B12.  Pt denies chest pain, increased sob or doe, wheezing, orthopnea, PND palpitations, dizziness or syncope, but has worsening leg swelling in the last 3 wks.   Pt denies polydipsia, polyuria, or new focal neuro s/s.   Pt denies fever, wt loss, night sweats, loss of appetite, or other constitutional symptoms  No other new complaints  Wt Readings from Last 3 Encounters:  06/06/20 289 lb (131.1 kg)  11/18/19 281 lb (127.5 kg)  06/25/19 288 lb (130.6 kg)   BP Readings from Last 3 Encounters:  06/06/20 140/86  11/18/19 140/90  06/25/19 140/86   Immunization History  Administered Date(s) Administered  . DTaP 10/06/1997  . Fluad Quad(high Dose 65+) 11/18/2019  . Influenza Split 10/18/2010, 10/30/2011  . Influenza, High Dose Seasonal PF 11/14/2016  . Influenza,inj,Quad PF,6+ Mos 10/20/2012, 03/24/2014, 12/14/2015  . PFIZER(Purple Top)SARS-COV-2 Vaccination 04/22/2019, 05/13/2019  . Pneumococcal Conjugate-13 07/16/2013  . Pneumococcal Polysaccharide-23 08/24/2014  . Tdap 10/18/2010  There are no preventive care reminders to display for this patient.    Past Medical History:  Diagnosis Date  . Allergy   . Anxiety    "take RX prn"  . Arthritis    "all over"  . Asthma   . Chronic bronchitis ([place])   . GERD (gastroesophageal reflux disease)   . Hypertension   . Insomnia 05/31/2011  .  OSA on CPAP    wears CPAP sometimes (06/23/2014)  . Other and unspecified hyperlipidemia 08/04/2012  . PONV (postoperative nausea and vomiting)   . Shortness of breath dyspnea    with exertion  . Shoulder pain, bilateral 10/18/2010  . Vitamin D deficiency 12/21/2015   Past Surgical History:  Procedure Laterality Date  . APPENDECTOMY  1966  . BREAST EXCISIONAL BIOPSY Right   . CHOLECYSTECTOMY OPEN  1981  . FRACTURE SURGERY    . ORIF ANKLE FRACTURE Right 06/23/2014  . ORIF ANKLE FRACTURE Right 06/23/2014   Procedure: Take Down [place] Right Ankle, Open Reduction Internal Fixation Right Ankle, Possible Syndesmosis Repair, Medial Joint Debridement;  Surgeon: Newt Minion, MD;  Location: [place];  Service: Orthopedics;  Laterality: Right;  . TONSILLECTOMY  1963  . TOTAL ABDOMINAL HYSTERECTOMY  1980   w/BSO    reports that she quit smoking about 36 years ago. Her smoking use included cigarettes. She has a 0.20 pack-year smoking history. She has never used smokeless tobacco. She reports that she does not drink alcohol and does not use drugs. family history includes Arthritis in her father, mother, and another family member; Cancer in an other family member; Heart disease in her father and mother; Hypertension in her father and  mother. Allergies  Allergen Reactions  . Amoxicillin-Pot Clavulanate     Hives  . Azithromycin Other (See Comments)    abd pain  . Celebrex [Celecoxib] Itching  . Lipitor [Atorvastatin] Itching   Current Outpatient Medications on File Prior to Visit  Medication Sig Dispense Refill  . albuterol (VENTOLIN HFA) 108 (90 Base) MCG/ACT inhaler Inhale 1-2 puffs into the lungs every 6 (six) hours as needed for wheezing or shortness of breath. 8 g 2  . carbamazepine (TEGRETOL XR) 100 MG 12 hr tablet Take 1 tablet (100 mg total) by mouth 2 (two) times daily. 60 tablet 5  . citalopram (CELEXA) 10 MG tablet TAKE 1 TABLET BY MOUTH EVERY DAY 90 tablet 3  . hydrOXYzine  (ATARAX/VISTARIL) 25 MG tablet TAKE 1-2 TABS BY MOUTH THREE TIMES PER DAY AS NEEDED FOR ITCHING 540 tablet 1  . meloxicam (MOBIC) 15 MG tablet Take 1 tablet (15 mg total) by mouth daily as needed for pain. 90 tablet 3  . tiZANidine (ZANAFLEX) 2 MG tablet Take 1 tablet (2 mg total) by mouth every 6 (six) hours as needed for muscle spasms. 50 tablet 2  . traMADol (ULTRAM) 50 MG tablet TAKE 1 TABLET (50 MG TOTAL) BY MOUTH 4 (FOUR) TIMES DAILY AS NEEDED. 120 tablet 2   No current facility-administered medications on file prior to visit.        ROS:  All others reviewed and negative.  Objective        PE:  BP 140/86 (BP Location: Right Arm, Patient Position: Sitting, Cuff Size: Large)   Pulse 100   Temp 98.7 F (37.1 C) (Oral)   Ht 5\' 2"  (1.575 m)   Wt 289 lb (131.1 kg)   SpO2 96%   BMI 52.86 kg/m                 Constitutional: Pt appears in NAD               HENT: Head: NCAT.                Right Ear: External ear normal.                 Left Ear: External ear normal.                Eyes: . Pupils are equal, round, and reactive to light. Conjunctivae and EOM are normal               Nose: without d/c or deformity               Neck: Neck supple. Gross normal ROM               Cardiovascular: Normal rate and regular rhythm.                 Pulmonary/Chest: Effort normal and breath sounds without rales or wheezing.                Abd:  Soft, NT, ND, + BS, no organomegaly               Neurological: Pt is alert. At baseline orientation, motor grossly intact               Skin: Skin is warm. No rashes, no other new lesions, LE edema - 1-2+ bilateral               Psychiatric: Pt behavior is normal without agitation   Micro: none  Cardiac tracings I have personally interpreted today:  none  Pertinent Radiological findings (summarize): none   Lab Results  Component Value Date   WBC 6.5 06/06/2020   HGB 13.7 06/06/2020   HCT 41.1 06/06/2020   PLT 297.0 06/06/2020   GLUCOSE 106  (H) 06/06/2020   CHOL 239 (H) 06/06/2020   TRIG 112.0 06/06/2020   HDL 67.90 06/06/2020   LDLDIRECT 134.1 05/02/2012   LDLCALC 149 (H) 06/06/2020   ALT 13 06/06/2020   AST 19 06/06/2020   NA 141 06/06/2020   K 4.9 06/06/2020   CL 103 06/06/2020   CREATININE 0.97 06/06/2020   BUN 16 06/06/2020   CO2 30 06/06/2020   TSH 1.57 06/06/2020   INR 1.05 06/22/2014   HGBA1C 6.1 06/06/2020   Assessment/Plan:  Betty Green is a 75 y.o. Black or African American [2] female with  has a past medical history of Allergy, Anxiety, Arthritis, Asthma, Chronic bronchitis ([place]), GERD (gastroesophageal reflux disease), Hypertension, Insomnia (05/31/2011), OSA on CPAP, Other and unspecified hyperlipidemia (08/04/2012), PONV (postoperative nausea and vomiting), Shortness of breath dyspnea, Shoulder pain, bilateral (10/18/2010), and Vitamin D deficiency (12/21/2015).  Encounter for well adult exam with abnormal findings Age and sex appropriate education and counseling updated with regular exercise and diet Referrals for preventative services - none needed Immunizations addressed - for Tdap later this year Smoking counseling  - none needed Evidence for depression or other mood disorder - none significant Most recent labs reviewed. I have personally reviewed and have noted: 1) the patient's medical and social history 2) The patient's current medications and supplements 3) The patient's height, weight, and BMI have been recorded in the chart   Vitamin D deficiency Last vitamin D Lab Results  Component Value Date   VD25OH 26.11 (L) 06/06/2020   Low, to start oral replacement   Peripheral edema C/w likely venous insufficiency, for increased lasix 80 bid prn  Morbid obesity ([place]) Ok for victoza prn,  to f/u any worsening symptoms or concerns  Hypertension BP Readings from Last 3 Encounters:  06/06/20 140/86  11/18/19 140/90  06/25/19 140/86   Stable, pt to continue medical treatment  *lasix    Hyperglycemia Lab Results  Component Value Date   HGBA1C 6.1 06/06/2020   Stable, pt to continue current medical treatment  - diet   HLD (hyperlipidemia) Lab Results  Component Value Date   LDLCALC 149 (H) 06/06/2020   Stable, pt to continue current statin crestor 40   Followup: Return in about 3 months (around 09/06/2020).  Cathlean Cower, MD 06/12/2020 8:05 PM [facility]

## 2020-06-06 NOTE — Patient Instructions (Signed)
Please take all new medication as prescribed - the lasix as prescribed, and the victoza  Please continue all other medications as before, and refills have been done if requested.  Please have the pharmacy call with any other refills you may need.  Please continue your efforts at being more active, low cholesterol diet, and weight control.  You are otherwise up to date with prevention measures today.  Please keep your appointments with your specialists as you may have planned  Please go to the LAB at the blood drawing area for the tests to be done  You will be contacted by phone if any changes need to be made immediately.  Otherwise, you will receive a letter about your results with an explanation, but please check with MyChart first.  Please remember to sign up for MyChart if you have not done so, as this will be important to you in the future with finding out test results, communicating by private email, and scheduling acute appointments online when needed.  Please make an Appointment to return in 3 months

## 2020-06-12 NOTE — Assessment & Plan Note (Signed)
Last vitamin D Lab Results  Component Value Date   VD25OH 26.11 (L) 06/06/2020   Low, to start oral replacement

## 2020-06-12 NOTE — Assessment & Plan Note (Signed)
Age and sex appropriate education and counseling updated with regular exercise and diet Referrals for preventative services - none needed Immunizations addressed - for Tdap later this year Smoking counseling  - none needed Evidence for depression or other mood disorder - none significant Most recent labs reviewed. I have personally reviewed and have noted: 1) the patient's medical and social history 2) The patient's current medications and supplements 3) The patient's height, weight, and BMI have been recorded in the chart

## 2020-06-12 NOTE — Assessment & Plan Note (Signed)
BP Readings from Last 3 Encounters:  06/06/20 140/86  11/18/19 140/90  06/25/19 140/86   Stable, pt to continue medical treatment *lasix

## 2020-06-12 NOTE — Assessment & Plan Note (Signed)
Lab Results  Component Value Date   LDLCALC 149 (H) 06/06/2020   Stable, pt to continue current statin crestor 40

## 2020-06-12 NOTE — Assessment & Plan Note (Signed)
C/w likely venous insufficiency, for increased lasix 80 bid prn

## 2020-06-12 NOTE — Assessment & Plan Note (Signed)
Ok for victoza prn,  to f/u any worsening symptoms or concerns

## 2020-06-12 NOTE — Assessment & Plan Note (Signed)
Lab Results  Component Value Date   HGBA1C 6.1 06/06/2020   Stable, pt to continue current medical treatment  - diet

## 2020-06-13 ENCOUNTER — Telehealth: Payer: Self-pay | Admitting: Internal Medicine

## 2020-06-13 NOTE — Telephone Encounter (Signed)
LVM for pt to rtn my call to schedule awv with nha. Please schedule awv if pt calls the office.  

## 2020-06-22 ENCOUNTER — Encounter: Payer: Self-pay | Admitting: Internal Medicine

## 2020-07-11 DIAGNOSIS — M170 Bilateral primary osteoarthritis of knee: Secondary | ICD-10-CM | POA: Diagnosis not present

## 2020-07-11 DIAGNOSIS — M19011 Primary osteoarthritis, right shoulder: Secondary | ICD-10-CM | POA: Diagnosis not present

## 2020-08-05 ENCOUNTER — Other Ambulatory Visit: Payer: Self-pay | Admitting: Internal Medicine

## 2020-08-24 ENCOUNTER — Other Ambulatory Visit: Payer: Self-pay | Admitting: Internal Medicine

## 2020-09-03 ENCOUNTER — Other Ambulatory Visit: Payer: Self-pay | Admitting: Primary Care

## 2020-09-07 ENCOUNTER — Other Ambulatory Visit: Payer: Self-pay | Admitting: Internal Medicine

## 2020-09-26 ENCOUNTER — Other Ambulatory Visit: Payer: Self-pay

## 2020-09-26 ENCOUNTER — Ambulatory Visit: Payer: Medicare Other | Admitting: Adult Health

## 2020-09-26 ENCOUNTER — Encounter: Payer: Self-pay | Admitting: Adult Health

## 2020-09-26 ENCOUNTER — Ambulatory Visit (INDEPENDENT_AMBULATORY_CARE_PROVIDER_SITE_OTHER): Payer: Medicare Other

## 2020-09-26 VITALS — BP 120/80 | HR 103 | Temp 98.2°F | Ht 62.0 in | Wt 293.4 lb

## 2020-09-26 DIAGNOSIS — G478 Other sleep disorders: Secondary | ICD-10-CM | POA: Diagnosis not present

## 2020-09-26 DIAGNOSIS — R059 Cough, unspecified: Secondary | ICD-10-CM | POA: Diagnosis not present

## 2020-09-26 DIAGNOSIS — J45909 Unspecified asthma, uncomplicated: Secondary | ICD-10-CM

## 2020-09-26 DIAGNOSIS — G4733 Obstructive sleep apnea (adult) (pediatric): Secondary | ICD-10-CM | POA: Diagnosis not present

## 2020-09-26 DIAGNOSIS — R0602 Shortness of breath: Secondary | ICD-10-CM | POA: Diagnosis not present

## 2020-09-26 MED ORDER — FLUTICASONE FUROATE-VILANTEROL 100-25 MCG/INH IN AEPB: 1.0000 | INHALATION_SPRAY | Freq: Every day | RESPIRATORY_TRACT | 5 refills | Status: DC

## 2020-09-26 MED ORDER — FLUTICASONE FUROATE-VILANTEROL 100-25 MCG/INH IN AEPB: 1.0000 | INHALATION_SPRAY | Freq: Every day | RESPIRATORY_TRACT | 0 refills | Status: DC

## 2020-09-26 MED ORDER — ALBUTEROL SULFATE HFA 108 (90 BASE) MCG/ACT IN AERS: 1.0000 | INHALATION_SPRAY | Freq: Four times a day (QID) | RESPIRATORY_TRACT | 2 refills | Status: DC | PRN

## 2020-09-26 NOTE — Progress Notes (Signed)
$'@Patient'k$  ID: Betty Green, female    DOB: [birth year], 75 y.o.   MRN: [id number]  Chief Complaint  Patient presents with   Follow-up    Referring provider: Biagio Borg, MD  HPI: 75 year old female followed for obstructive sleep apnea Medical history significant for sleep paralysis  TEST/EVENTS :  07/2011 NPSG  (271 lbs )AHI of 37.>> cpap 12 cm water.   2D echo April 2018 EF 60 to 123456, grade 1 diastolic dysfunction, pulmonary artery systolic pressure 32 mmHg  09/26/2020 Follow up : OSA  Patient returns for a follow-up visit.  She has underlying severe obstructive sleep apnea. Last seen 2 years ago  .  Patient says that she would like to get back on her CPAP.  She has not worn her CPAP machine in greater than a year.  She says that her CPAP machine was lost during her recent move 1 year ago.  She says she previously wore her CPAP every night.  Felt more rested when she wore that.  She is having some daytime sleepiness and restless sleep. 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 Goes to bed at 4 am , get up at 9 am .  Takes 1 hr to go to sleep . Sometimes takes Hydroxyzine to help with insomnia  Has daytime sleepiness. Has minimal caffeine .  History of sleep paralysis . Last episode 5 months ago, happens rarely.  No symptoms suspcious for cataplexy , night terrors.  Working on weight loss. Current weight 293lbs, BMI 53.   Patient complains that she has chronic bronchitis and has intermittent cough and wheezing.  She is a former smoker.  She does use albuterol most days once a day.  She has had no previous pulmonary function testing.  She denies any hemoptysis, chest pain orthopnea PND.  She does have chronic leg swelling.  She does get short of breath with activities.  No shortness of breath at rest.      Allergies  Allergen Reactions   Amoxicillin-Pot Clavulanate Hives    Hives   Azithromycin Other (See Comments)    abd pain   Celebrex [Celecoxib] Itching   Lipitor  [Atorvastatin] Itching    Immunization History  Administered Date(s) Administered   DTaP 10/06/1997   Fluad Quad(high Dose 65+) 11/18/2019   Influenza Split 10/18/2010, 10/30/2011   Influenza, High Dose Seasonal PF 11/14/2016   Influenza,inj,Quad PF,6+ Mos 10/20/2012, 03/24/2014, 12/14/2015   PFIZER Comirnaty(Gray Top)Covid-19 Tri-Sucrose Vaccine 03/28/2020   PFIZER(Purple Top)SARS-COV-2 Vaccination 04/22/2019, 05/13/2019   Pneumococcal Conjugate-13 07/16/2013   Pneumococcal Polysaccharide-23 08/24/2014   Tdap 10/18/2010    Past Medical History:  Diagnosis Date   Allergy    Anxiety    "take RX prn"   Arthritis    "all over"   Asthma    Chronic bronchitis ([facility])    GERD (gastroesophageal reflux disease)    Hypertension    Insomnia 05/31/2011   OSA on CPAP    wears CPAP sometimes (06/23/2014)   Other and unspecified hyperlipidemia 08/04/2012   PONV (postoperative nausea and vomiting)    Shortness of breath dyspnea    with exertion   Shoulder pain, bilateral 10/18/2010   Vitamin D deficiency 12/21/2015    Tobacco History: Social History   Tobacco Use  Smoking Status Former   Packs/day: 0.10   Years: 2.00   Pack years: 0.20   Types: Cigarettes   Quit date: 02/06/1984   Years since quitting: 36.6  Smokeless Tobacco Never   Counseling  given: Not Answered   Outpatient Medications Prior to Visit  Medication Sig Dispense Refill   furosemide (LASIX) 80 MG tablet 1 by mouth twice daily as needed for swelling 180 tablet 3   hydrOXYzine (ATARAX/VISTARIL) 25 MG tablet TAKE 1-2 TABS BY MOUTH THREE TIMES PER DAY AS NEEDED FOR ITCHING 540 tablet 1   meloxicam (MOBIC) 15 MG tablet TAKE 1 TABLET BY MOUTH EVERY DAY AS NEEDED FOR PAIN 90 tablet 1   traMADol (ULTRAM) 50 MG tablet TAKE 1 TABLET (50 MG TOTAL) BY MOUTH 4 (FOUR) TIMES DAILY AS NEEDED. 120 tablet 2   albuterol (VENTOLIN HFA) 108 (90 Base) MCG/ACT inhaler Inhale 1-2 puffs into the lungs every 6 (six) hours as needed for  wheezing or shortness of breath. 8 g 2   azaTHIOprine (IMURAN) 50 MG tablet 50 mg. (Patient not taking: Reported on 09/26/2020)     carbamazepine (TEGRETOL XR) 100 MG 12 hr tablet Take 1 tablet (100 mg total) by mouth 2 (two) times daily. (Patient not taking: Reported on 09/26/2020) 60 tablet 5   Cholecalciferol (THERA-D 4000) 100 MCG (4000 UT) TABS 1 tab by mouth once daily (Patient not taking: Reported on 09/26/2020) 30 tablet 99   citalopram (CELEXA) 10 MG tablet TAKE 1 TABLET BY MOUTH EVERY DAY (Patient not taking: Reported on 09/26/2020) 90 tablet 3   liraglutide (VICTOZA) 18 MG/3ML SOPN Inject 1.8 mg into the skin daily. (Patient not taking: Reported on 09/26/2020) 9 mL 11   methylPREDNISolone (MEDROL DOSEPAK) 4 MG TBPK tablet See admin instructions. (Patient not taking: Reported on 09/26/2020)     rosuvastatin (CRESTOR) 40 MG tablet Take 1 tablet (40 mg total) by mouth daily. (Patient not taking: Reported on 09/26/2020) 90 tablet 3   tiZANidine (ZANAFLEX) 2 MG tablet Take 1 tablet (2 mg total) by mouth every 6 (six) hours as needed for muscle spasms. (Patient not taking: Reported on 09/26/2020) 50 tablet 2   No facility-administered medications prior to visit.     Review of Systems:   Constitutional:   No  weight loss, night sweats,  Fevers, chills,  +fatigue, or  lassitude.  HEENT:   No headaches,  Difficulty swallowing,  Tooth/dental problems, or  Sore throat,                No sneezing, itching, ear ache, nasal congestion, post nasal drip,   CV:  No chest pain,  Orthopnea, PND, swelling in lower extremities, anasarca, dizziness, palpitations, syncope.   GI  No heartburn, indigestion, abdominal pain, nausea, vomiting, diarrhea, change in bowel habits, loss of appetite, bloody stools.   Resp: .  No chest wall deformity  Skin: no rash or lesions.  GU: no dysuria, change in color of urine, no urgency or frequency.  No flank pain, no hematuria   MS:  No joint pain or swelling.  No  decreased range of motion.  No back pain.    Physical Exam  BP 120/80 (BP Location: Left Arm, Patient Position: Sitting, Cuff Size: Large)   Pulse (!) 103   Temp 98.2 F (36.8 C) (Oral)   Ht '5\' 2"'$  (1.575 m)   Wt 293 lb 6.4 oz (133.1 kg)   SpO2 98%   BMI 53.66 kg/m   GEN: A/Ox3; pleasant , NAD, well nourished    HEENT:  [place]/AT, , NOSE-clear, THROAT-clear, no lesions, no postnasal drip or exudate noted.  Poor dentition, grade 2-3 MP airway  NECK:  Supple w/ fair ROM; no JVD; normal carotid impulses  w/o bruits; no thyromegaly or nodules palpated; no lymphadenopathy.    RESP  Clear  P & A; w/o, wheezes/ rales/ or rhonchi. no accessory muscle use, no dullness to percussion  CARD:  RRR, no m/r/g, 1+peripheral edema, pulses intact, no cyanosis or clubbing.  GI:   Soft & nt; nml bowel sounds; no organomegaly or masses detected.   Musco: Warm bil, no deformities or joint swelling noted.   Neuro: alert, no focal deficits noted.    Skin: Warm, no lesions or rashes    Lab Results:  CBC    Component Value Date/Time   WBC 6.5 06/06/2020 1517   RBC 4.64 06/06/2020 1517   HGB 13.7 06/06/2020 1517   HCT 41.1 06/06/2020 1517   PLT 297.0 06/06/2020 1517   MCV 88.7 06/06/2020 1517   MCH 28.1 06/22/2014 1443   MCHC 33.3 06/06/2020 1517   RDW 16.0 (H) 06/06/2020 1517   LYMPHSABS 2.0 06/06/2020 1517   MONOABS 0.5 06/06/2020 1517   EOSABS 0.2 06/06/2020 1517   BASOSABS 0.0 06/06/2020 1517    BMET    Component Value Date/Time   NA 141 06/06/2020 1517   K 4.9 06/06/2020 1517   CL 103 06/06/2020 1517   CO2 30 06/06/2020 1517   GLUCOSE 106 (H) 06/06/2020 1517   BUN 16 06/06/2020 1517   CREATININE 0.97 06/06/2020 1517   CALCIUM 10.3 06/06/2020 1517   GFRNONAA 54 (L) 06/22/2014 1443   GFRAA >60 06/22/2014 1443    BNP No results found for: BNP  ProBNP    Component Value Date/Time   PROBNP 29.0 12/18/2016 1426    Imaging: No results found.    No flowsheet data  found.  No results found for: NITRICOXIDE      Assessment & Plan:   Obstructive sleep apnea Severe obstructive sleep apnea-uncontrolled due to loss of CPAP machine. Patient education given on sleep apnea and CPAP usage.  Patient last sleep study was in 2013 showing severe sleep apnea with AHI at 37/hour.  Patient has not worn her machine greater than 1 year.  Will need a repeat home sleep study.  Once results are back if continues to be positive for sleep apnea will need a CPAP machine ordered.  Plan  Patient Instructions  Set up for home sleep study  Work on healthy weight loss.  Do not drive if sleepy .  Healthy sleep regimen   Chest xray today  Begin BREO 1 puff daily , rinse after use.  Albuterol inhaler 1-2 puffs every 6hr as needed.  Follow up with Dr. Elsworth Soho  in 4-6 weeks with PFT and As needed        Sleep paralysis Has rare episodes.  Continue to monitor sleep study pending  Morbid obesity ([place]) Current weight at 293 pounds, BMI 53.  Healthy weight loss discussed.  Allergic asthma History of chronic bronchitis and asthma.  Patient is a former smoker.  Will need a chest x-ray today.  Check PFTs on return. She has increased symptom burden along with increased albuterol use.  Trial of Breo.  Plan  Patient Instructions  Set up for home sleep study  Work on healthy weight loss.  Do not drive if sleepy .  Healthy sleep regimen   Chest xray today  Begin BREO 1 puff daily , rinse after use.  Albuterol inhaler 1-2 puffs every 6hr as needed.  Follow up with Dr. Elsworth Soho  in 4-6 weeks with PFT and As needed  I spent   40 minutes dedicated to the care of this patient on the date of this encounter to include pre-visit review of records, face-to-face time with the patient discussing conditions above, post visit ordering of testing, clinical documentation with the electronic health record, making appropriate referrals as documented, and communicating necessary  findings to members of the patients care team.   Rexene Edison, NP 09/26/2020

## 2020-09-26 NOTE — Assessment & Plan Note (Signed)
Current weight at 293 pounds, BMI 53.  Healthy weight loss discussed.

## 2020-09-26 NOTE — Assessment & Plan Note (Signed)
Severe obstructive sleep apnea-uncontrolled due to loss of CPAP machine. Patient education given on sleep apnea and CPAP usage.  Patient last sleep study was in 2013 showing severe sleep apnea with AHI at 37/hour.  Patient has not worn her machine greater than 1 year.  Will need a repeat home sleep study.  Once results are back if continues to be positive for sleep apnea will need a CPAP machine ordered.  Plan  Patient Instructions  Set up for home sleep study  Work on healthy weight loss.  Do not drive if sleepy .  Healthy sleep regimen   Chest xray today  Begin BREO 1 puff daily , rinse after use.  Albuterol inhaler 1-2 puffs every 6hr as needed.  Follow up with Dr. Elsworth Soho  in 4-6 weeks with PFT and As needed

## 2020-09-26 NOTE — Assessment & Plan Note (Signed)
Has rare episodes.  Continue to monitor sleep study pending

## 2020-09-26 NOTE — Assessment & Plan Note (Signed)
History of chronic bronchitis and asthma.  Patient is a former smoker.  Will need a chest x-ray today.  Check PFTs on return. She has increased symptom burden along with increased albuterol use.  Trial of Breo.  Plan  Patient Instructions  Set up for home sleep study  Work on healthy weight loss.  Do not drive if sleepy .  Healthy sleep regimen   Chest xray today  Begin BREO 1 puff daily , rinse after use.  Albuterol inhaler 1-2 puffs every 6hr as needed.  Follow up with Dr. Elsworth Soho  in 4-6 weeks with PFT and As needed

## 2020-09-26 NOTE — Patient Instructions (Signed)
Set up for home sleep study  Work on healthy weight loss.  Do not drive if sleepy .  Healthy sleep regimen   Chest xray today  Begin BREO 1 puff daily , rinse after use.  Albuterol inhaler 1-2 puffs every 6hr as needed.  Follow up with Dr. Elsworth Soho  in 4-6 weeks with PFT and As needed

## 2020-11-02 ENCOUNTER — Encounter: Payer: Self-pay | Admitting: Internal Medicine

## 2020-11-07 ENCOUNTER — Telehealth: Payer: Self-pay | Admitting: *Deleted

## 2020-11-07 NOTE — Telephone Encounter (Signed)
ATC patient x1, no answer, left message to return call.    If patient returns call, please ask her to bring in her SD card for her CPAP machine.

## 2020-11-08 ENCOUNTER — Other Ambulatory Visit: Payer: Self-pay

## 2020-11-08 ENCOUNTER — Ambulatory Visit: Payer: Medicare Other | Admitting: Pulmonary Disease

## 2020-11-08 ENCOUNTER — Encounter: Payer: Self-pay | Admitting: Adult Health

## 2020-11-08 ENCOUNTER — Ambulatory Visit: Payer: Medicare Other | Admitting: Adult Health

## 2020-11-08 ENCOUNTER — Ambulatory Visit (INDEPENDENT_AMBULATORY_CARE_PROVIDER_SITE_OTHER): Payer: Medicare Other | Admitting: Pulmonary Disease

## 2020-11-08 VITALS — BP 140/92 | HR 111 | Temp 98.3°F | Ht 60.0 in | Wt 290.6 lb

## 2020-11-08 DIAGNOSIS — Z79899 Other long term (current) drug therapy: Secondary | ICD-10-CM | POA: Diagnosis not present

## 2020-11-08 DIAGNOSIS — J45909 Unspecified asthma, uncomplicated: Secondary | ICD-10-CM

## 2020-11-08 DIAGNOSIS — H30033 Focal chorioretinal inflammation, peripheral, bilateral: Secondary | ICD-10-CM | POA: Diagnosis not present

## 2020-11-08 DIAGNOSIS — Z23 Encounter for immunization: Secondary | ICD-10-CM

## 2020-11-08 DIAGNOSIS — G4733 Obstructive sleep apnea (adult) (pediatric): Secondary | ICD-10-CM | POA: Diagnosis not present

## 2020-11-08 DIAGNOSIS — G478 Other sleep disorders: Secondary | ICD-10-CM

## 2020-11-08 DIAGNOSIS — H16002 Unspecified corneal ulcer, left eye: Secondary | ICD-10-CM | POA: Diagnosis not present

## 2020-11-08 DIAGNOSIS — H35063 Retinal vasculitis, bilateral: Secondary | ICD-10-CM | POA: Diagnosis not present

## 2020-11-08 DIAGNOSIS — H3581 Retinal edema: Secondary | ICD-10-CM | POA: Diagnosis not present

## 2020-11-08 DIAGNOSIS — H2513 Age-related nuclear cataract, bilateral: Secondary | ICD-10-CM | POA: Diagnosis not present

## 2020-11-08 DIAGNOSIS — H35033 Hypertensive retinopathy, bilateral: Secondary | ICD-10-CM | POA: Diagnosis not present

## 2020-11-08 LAB — PULMONARY FUNCTION TEST
DL/VA % pred: 114 %
DL/VA: 4.82 ml/min/mmHg/L
DLCO cor % pred: 72 %
DLCO cor: 12 ml/min/mmHg
DLCO unc % pred: 72 %
DLCO unc: 12 ml/min/mmHg
FEF 25-75 Post: 2.73 L/sec
FEF 25-75 Pre: 3.1 L/sec
FEF2575-%Change-Post: -12 %
FEF2575-%Pred-Post: 228 %
FEF2575-%Pred-Pre: 260 %
FEV1-%Change-Post: -4 %
FEV1-%Pred-Post: 112 %
FEV1-%Pred-Pre: 117 %
FEV1-Post: 1.5 L
FEV1-Pre: 1.56 L
FEV1FVC-%Change-Post: 3 %
FEV1FVC-%Pred-Pre: 120 %
FEV6-%Change-Post: -7 %
FEV6-%Pred-Post: 95 %
FEV6-%Pred-Pre: 103 %
FEV6-Post: 1.58 L
FEV6-Pre: 1.7 L
FEV6FVC-%Pred-Post: 105 %
FEV6FVC-%Pred-Pre: 105 %
FVC-%Change-Post: -7 %
FVC-%Pred-Post: 90 %
FVC-%Pred-Pre: 97 %
FVC-Post: 1.58 L
FVC-Pre: 1.7 L
Post FEV1/FVC ratio: 95 %
Post FEV6/FVC ratio: 100 %
Pre FEV1/FVC ratio: 92 %
Pre FEV6/FVC Ratio: 100 %

## 2020-11-08 MED ORDER — CARBAMAZEPINE ER 100 MG PO CP12: 100.0000 mg | ORAL_CAPSULE | Freq: Two times a day (BID) | ORAL | 1 refills | Status: DC

## 2020-11-08 NOTE — Assessment & Plan Note (Signed)
Home sleep study  Plan  Patient Instructions  Home sleep study  Work on healthy weight loss.  Do not drive if sleepy .  Healthy sleep regimen   Continue on BREO 1 puff daily , rinse after use.  Albuterol inhaler 1-2 puffs every 6hr as needed.  Flu shot today .   Follow up with Dr. Elsworth Soho in 3 months and As needed

## 2020-11-08 NOTE — Progress Notes (Signed)
@Patient  ID: Betty Green, female    DOB: 1945-08-19, 75 y.o.   MRN: [id number]  Chief Complaint  Patient presents with   Follow-up    Referring provider: Biagio Borg, MD  HPI: 75 year old female former smoker followed for obstructive sleep apnea Medical history significant for sleep paralysis  TEST/EVENTS :  07/2011 NPSG  (271 lbs )AHI of 37.>> cpap 12 cm water.    2D echo April 2018 EF 60 to 25%, grade 1 diastolic dysfunction, pulmonary artery systolic pressure 32 mmHg  11/08/2020 Follow up ; OSA and chronic bronchitis  Patient returns for a 6-week follow-up.  Patient has been followed for obstructive sleep apnea.  She has been without her CPAP for the last year.  Lost CPAP when she moved.  Last visit she was set up for a repeat home sleep study in order to get a new CPAP machine.  Home sleep study is pending.  Last visit patient complained of some ongoing cough and shortness of breath.  She has a history of chronic bronchitis and asthma.  She was having increased symptoms over the last few months.  She was started on Breo last visit.  And set up for pulmonary function testing.  PFTs were done today and show normal lung function with no airflow obstruction or restriction.  Decreased diffusing capacity with DLCO at 72%. Since last visit patient says her breathing is better with less cough and dyspnea. No albuterol use since starting BREO .  Gets winded with heavy activities. . Sedentary lifestyle, limited by severe knee pain and mobility issues.  BMI 56.  Is limited by her knees, needs to lose weight to get knee replacement . Hard time losing weight . Discussed referral for weight loss.   Lives with sister. Independent . Drives. Light house chores.       Allergies  Allergen Reactions   Amoxicillin-Pot Clavulanate Hives    Hives   Azithromycin Other (See Comments)    abd pain   Celebrex [Celecoxib] Itching   Lipitor [Atorvastatin] Itching    Immunization History   Administered Date(s) Administered   DTaP 10/06/1997   Fluad Quad(high Dose 65+) 11/18/2019   Influenza Split 10/18/2010, 10/30/2011   Influenza, High Dose Seasonal PF 11/14/2016   Influenza,inj,Quad PF,6+ Mos 10/20/2012, 03/24/2014, 12/14/2015   PFIZER Comirnaty(Gray Top)Covid-19 Tri-Sucrose Vaccine 03/28/2020   PFIZER(Purple Top)SARS-COV-2 Vaccination 04/22/2019, 05/13/2019   Pneumococcal Conjugate-13 07/16/2013   Pneumococcal Polysaccharide-23 08/24/2014   Tdap 10/18/2010    Past Medical History:  Diagnosis Date   Allergy    Anxiety    "take RX prn"   Arthritis    "all over"   Asthma    Chronic bronchitis (HCC)    GERD (gastroesophageal reflux disease)    Hypertension    Insomnia 05/31/2011   OSA on CPAP    wears CPAP sometimes (06/23/2014)   Other and unspecified hyperlipidemia 08/04/2012   PONV (postoperative nausea and vomiting)    Shortness of breath dyspnea    with exertion   Shoulder pain, bilateral 10/18/2010   Vitamin D deficiency 12/21/2015    Tobacco History: Social History   Tobacco Use  Smoking Status Former   Packs/day: 0.10   Years: 2.00   Pack years: 0.20   Types: Cigarettes   Quit date: 02/06/1984   Years since quitting: 36.7  Smokeless Tobacco Never   Counseling given: Not Answered   Outpatient Medications Prior to Visit  Medication Sig Dispense Refill   albuterol (VENTOLIN HFA) 108 (90  Base) MCG/ACT inhaler Inhale 1-2 puffs into the lungs every 6 (six) hours as needed for wheezing or shortness of breath. 8 g 2   fluticasone furoate-vilanterol (BREO ELLIPTA) 100-25 MCG/INH AEPB Inhale 1 puff into the lungs daily. 30 each 5   fluticasone furoate-vilanterol (BREO ELLIPTA) 100-25 MCG/INH AEPB Inhale 1 puff into the lungs daily. 60 each 0   furosemide (LASIX) 80 MG tablet 1 by mouth twice daily as needed for swelling 180 tablet 3   hydrOXYzine (ATARAX/VISTARIL) 25 MG tablet TAKE 1-2 TABS BY MOUTH THREE TIMES PER DAY AS NEEDED FOR ITCHING 540 tablet  1   meloxicam (MOBIC) 15 MG tablet TAKE 1 TABLET BY MOUTH EVERY DAY AS NEEDED FOR PAIN 90 tablet 1   traMADol (ULTRAM) 50 MG tablet TAKE 1 TABLET (50 MG TOTAL) BY MOUTH 4 (FOUR) TIMES DAILY AS NEEDED. 120 tablet 2   No facility-administered medications prior to visit.     Review of Systems:   Constitutional:   No  weight loss, night sweats,  Fevers, chills,  +fatigue, or  lassitude.  HEENT:   No headaches,  Difficulty swallowing,  Tooth/dental problems, or  Sore throat,                No sneezing, itching, ear ache,  +nasal congestion, post nasal drip,   CV:  No chest pain,  Orthopnea, PND, swelling in lower extremities, anasarca, dizziness, palpitations, syncope.   GI  No heartburn, indigestion, abdominal pain, nausea, vomiting, diarrhea, change in bowel habits, loss of appetite, bloody stools.   Resp:   No excess mucus, no productive cough,  No non-productive cough,  No coughing up of blood.  No change in color of mucus.  No wheezing.  No chest wall deformity  Skin: no rash or lesions.  GU: no dysuria, change in color of urine, no urgency or frequency.  No flank pain, no hematuria   MS:  No joint pain or swelling.  No decreased range of motion.  No back pain.    Physical Exam  BP (!) 140/92 (BP Location: Left Wrist, Patient Position: Sitting, Cuff Size: Normal)   Pulse (!) 111   Temp 98.3 F (36.8 C) (Oral)   Ht 5' (1.524 m)   Wt 290 lb 9.6 oz (131.8 kg)   SpO2 94%   BMI 56.75 kg/m   GEN: A/Ox3; pleasant , NAD, BMI 56    HEENT:  [place]/AT,   NOSE-clear, THROAT-clear, no lesions, no postnasal drip or exudate noted.   NECK:  Supple w/ fair ROM; no JVD; normal carotid impulses w/o bruits; no thyromegaly or nodules palpated; no lymphadenopathy.    RESP  Clear  P & A; w/o, wheezes/ rales/ or rhonchi. no accessory muscle use, no dullness to percussion  CARD:  RRR, no m/r/g, 1+ peripheral edema, pulses intact, no cyanosis or clubbing.  GI:   Soft & nt; nml bowel sounds; no  organomegaly or masses detected.   Musco: Warm bil, no deformities or joint swelling noted.   Neuro: alert, no focal deficits noted.    Skin: Warm, no lesions or rashes    Lab Results:  CBC    Component Value Date/Time   WBC 6.5 06/06/2020 1517   RBC 4.64 06/06/2020 1517   HGB 13.7 06/06/2020 1517   HCT 41.1 06/06/2020 1517   PLT 297.0 06/06/2020 1517   MCV 88.7 06/06/2020 1517   MCH 28.1 06/22/2014 1443   MCHC 33.3 06/06/2020 1517   RDW 16.0 (H) 06/06/2020 1517  LYMPHSABS 2.0 06/06/2020 1517   MONOABS 0.5 06/06/2020 1517   EOSABS 0.2 06/06/2020 1517   BASOSABS 0.0 06/06/2020 1517    BMET    Component Value Date/Time   NA 141 06/06/2020 1517   K 4.9 06/06/2020 1517   CL 103 06/06/2020 1517   CO2 30 06/06/2020 1517   GLUCOSE 106 (H) 06/06/2020 1517   BUN 16 06/06/2020 1517   CREATININE 0.97 06/06/2020 1517   CALCIUM 10.3 06/06/2020 1517   GFRNONAA 54 (L) 06/22/2014 1443   GFRAA >60 06/22/2014 1443    BNP No results found for: BNP  ProBNP    Component Value Date/Time   PROBNP 29.0 12/18/2016 1426    Imaging: No results found.    PFT Results Latest Ref Rng & Units 11/08/2020  FVC-Pre L 1.70  FVC-Predicted Pre % 97  FVC-Post L 1.58  FVC-Predicted Post % 90  Pre FEV1/FVC % % 92  Post FEV1/FCV % % 95  FEV1-Pre L 1.56  FEV1-Predicted Pre % 117  FEV1-Post L 1.50  DLCO uncorrected ml/min/mmHg 12.00  DLCO UNC% % 72  DLCO corrected ml/min/mmHg 12.00  DLCO COR %Predicted % 72  DLVA Predicted % 114    No results found for: NITRICOXIDE      Assessment & Plan:   Allergic asthma Improved symptom control on BREO  PFT show normal lung function   Plan  Patient Instructions  Home sleep study  Work on healthy weight loss.  Do not drive if sleepy .  Healthy sleep regimen   Continue on BREO 1 puff daily , rinse after use.  Albuterol inhaler 1-2 puffs every 6hr as needed.  Flu shot today .   Follow up with Dr. Elsworth Soho in 3 months and As needed         Obstructive sleep apnea Home sleep study  Plan  Patient Instructions  Home sleep study  Work on healthy weight loss.  Do not drive if sleepy .  Healthy sleep regimen   Continue on BREO 1 puff daily , rinse after use.  Albuterol inhaler 1-2 puffs every 6hr as needed.  Flu shot today .   Follow up with Dr. Elsworth Soho in 3 months and As needed        Morbid obesity ([place]) Healthy weight loss.  Refer to healthy weight and wellness.      Rexene Edison, NP 11/08/2020

## 2020-11-08 NOTE — Progress Notes (Signed)
PFT done today. 

## 2020-11-08 NOTE — Patient Instructions (Addendum)
Home sleep study  Work on healthy weight loss.  Do not drive if sleepy .  Healthy sleep regimen   Continue on BREO 1 puff daily , rinse after use.  Albuterol inhaler 1-2 puffs every 6hr as needed.  Flu shot today .   Refer to Healthy Weight and Wellness .   Follow up with Dr. Elsworth Soho or Demarius Archila NP in 3 months and As needed

## 2020-11-08 NOTE — Assessment & Plan Note (Addendum)
Healthy weight loss.  Refer to healthy weight and wellness.

## 2020-11-08 NOTE — Assessment & Plan Note (Signed)
Improved symptom control on BREO  PFT show normal lung function   Plan  Patient Instructions  Home sleep study  Work on healthy weight loss.  Do not drive if sleepy .  Healthy sleep regimen   Continue on BREO 1 puff daily , rinse after use.  Albuterol inhaler 1-2 puffs every 6hr as needed.  Flu shot today .   Follow up with Dr. Elsworth Soho in 3 months and As needed

## 2020-12-12 ENCOUNTER — Ambulatory Visit: Payer: Medicare Other

## 2020-12-12 ENCOUNTER — Other Ambulatory Visit: Payer: Self-pay

## 2020-12-12 DIAGNOSIS — J45909 Unspecified asthma, uncomplicated: Secondary | ICD-10-CM

## 2020-12-12 DIAGNOSIS — G478 Other sleep disorders: Secondary | ICD-10-CM

## 2020-12-12 DIAGNOSIS — G4733 Obstructive sleep apnea (adult) (pediatric): Secondary | ICD-10-CM | POA: Diagnosis not present

## 2020-12-13 DIAGNOSIS — Z0289 Encounter for other administrative examinations: Secondary | ICD-10-CM

## 2020-12-20 DIAGNOSIS — G4733 Obstructive sleep apnea (adult) (pediatric): Secondary | ICD-10-CM | POA: Diagnosis not present

## 2020-12-28 ENCOUNTER — Other Ambulatory Visit: Payer: Self-pay | Admitting: Internal Medicine

## 2021-01-04 ENCOUNTER — Telehealth (INDEPENDENT_AMBULATORY_CARE_PROVIDER_SITE_OTHER): Payer: Self-pay

## 2021-01-04 NOTE — Telephone Encounter (Signed)
Pt called in and stated that she got a voice message yesterday about picking up her new patient packet. The pt said that she didn't know she had a deadline. I told the pt yes when your appts were made you were told about the deadline to pick up the packet. The pt said that she wont be able to pick up the packet , I asked her if she could have someone come pick it up by 10 am today. The pt stated no she couldn't, I told her that if the packet isnt picked up by 10 am the appts will be canceled and she will be charged the fees. The pt said that she has a hard time getting around and I told her I understand, but this is our policy.

## 2021-01-04 NOTE — Telephone Encounter (Signed)
Called pt LM, about picking up new pt packet. The pts dead line to have the packet picked up was 11/29. I stated in my message that if its not picked up by 10 am. We will cancel the appt and charge the pt fees.

## 2021-01-05 ENCOUNTER — Telehealth: Payer: Self-pay | Admitting: Adult Health

## 2021-01-05 NOTE — Telephone Encounter (Signed)
Called and spoke with pt letting her know the results of HST per TP and that we needed to schedule a visit, whether it be an in-office or video visit and pt verbalized understanding. Video visit scheduled for pt tomorrow 12/2 with TP. Nothing further needed.

## 2021-01-05 NOTE — Telephone Encounter (Signed)
Home sleep study December 23, 2020 showed mild to moderate obstructive sleep apnea with AHI at 14.4/hour, severe oxygen desaturation with SPO2 low at 71%.  Patient need an office visit or video visit to discuss results and treatment plan

## 2021-01-06 ENCOUNTER — Telehealth (INDEPENDENT_AMBULATORY_CARE_PROVIDER_SITE_OTHER): Payer: Medicare Other | Admitting: Adult Health

## 2021-01-06 ENCOUNTER — Other Ambulatory Visit: Payer: Self-pay

## 2021-01-06 DIAGNOSIS — G4733 Obstructive sleep apnea (adult) (pediatric): Secondary | ICD-10-CM | POA: Diagnosis not present

## 2021-01-06 DIAGNOSIS — J45909 Unspecified asthma, uncomplicated: Secondary | ICD-10-CM

## 2021-01-06 MED ORDER — FLUTICASONE FUROATE-VILANTEROL 100-25 MCG/ACT IN AEPB: 1.0000 | INHALATION_SPRAY | Freq: Every day | RESPIRATORY_TRACT | 5 refills | Status: DC

## 2021-01-06 NOTE — Patient Instructions (Signed)
Begin CPAP At bedtime  (order to [facility] )  Wear CPAP all night long for at least 6hrs  Work on healthy weight loss.  Do not drive if sleepy .  Healthy sleep regimen   Restart BREO 1 puff daily , rinse after use.  Albuterol inhaler 1-2 puffs every 6hr as needed.   Follow up with Dr. Elsworth Soho or Amaris Garrette NP in 2 months and As needed

## 2021-01-06 NOTE — Addendum Note (Signed)
Addended by: Vanessa Barbara on: 01/06/2021 04:41 PM   Modules accepted: Orders

## 2021-01-06 NOTE — Progress Notes (Signed)
Virtual Visit via Video Note  I connected with Betty Green on 01/06/21 at  3:00 PM EST by a video enabled telemedicine application and verified that I am speaking with the correct person using two identifiers.  Location: Patient: Home  Provider: Office    I discussed the limitations of evaluation and management by telemedicine and the availability of in person appointments. The patient expressed understanding and agreed to proceed.  History of Present Illness: 75 year old female former smoker followed for obstructive sleep apnea and chronic bronchitis Medical history significant for sleep paralysis  Today's video visit is for a 5-month follow-up.  Patient has underlying obstructive sleep apnea.  Previously had sleep study in 2013 with a AHI at 37.  Patient wore her CPAP for a long time but has been without her CPAP for over a year.  She says it was lost during the move.  Patient is continue to have ongoing sleep apnea symptoms with daytime sleepiness snoring and restless sleep.  She was set up for home sleep study that was completed on December 23, 2020 that showed mild to moderate obstructive sleep apnea with AHI at 14.4/hour with severe oxygen desaturations with SPO2 low at 71%. We discussed her sleep study results.  Patient treatment plan was discussed.  She would like to restart on CPAP.  Patient has a history of chronic bronchitis.  She was recently started on Breo.  Patient says that she did feel that [place] worked for her but unfortunately she ran out of her prescription.  And did not go to the pharmacy for refill.  We discussed medication compliance.  Patient says overall her breathing is doing okay with no flare of cough or wheezing.  She does have albuterol if she needs it.   Observations/Objective: 07/2011 NPSG  (271 lbs )AHI of 37.>> cpap 12 cm water.    2D echo April 2018 EF 60 to 58%, grade 1 diastolic dysfunction, pulmonary artery systolic pressure 32 mmHg  Chest x-ray September 26, 2020 showed no acute process  Assessment and Plan: Mild to moderate obstructive sleep apnea.  Patient is to restart CPAP.  Order to local DME company she uses [facility].  Begin CPAP AutoSet 5 to 15 cm H2O.  Mask of choice.  Chronic bronchitis.  Patient is to restart Breo.  Use albuterol as needed.  Plan  Patient Instructions  Begin CPAP At bedtime  (order to [facility] )  Wear CPAP all night long for at least 6hrs  Work on healthy weight loss.  Do not drive if sleepy .  Healthy sleep regimen   Restart BREO 1 puff daily , rinse after use.  Albuterol inhaler 1-2 puffs every 6hr as needed.   Follow up with Dr. Elsworth Soho or Lizandra Zakrzewski NP in 2 months and As needed      Follow Up Instructions:    I discussed the assessment and treatment plan with the patient. The patient was provided an opportunity to ask questions and all were answered. The patient agreed with the plan and demonstrated an understanding of the instructions.   The patient was advised to call back or seek an in-person evaluation if the symptoms worsen or if the condition fails to improve as anticipated.  I provided 24  minutes of non-face-to-face time during this encounter.   Rexene Edison, NP

## 2021-01-10 ENCOUNTER — Ambulatory Visit (INDEPENDENT_AMBULATORY_CARE_PROVIDER_SITE_OTHER): Payer: Medicare Other | Admitting: Internal Medicine

## 2021-01-10 ENCOUNTER — Other Ambulatory Visit: Payer: Self-pay

## 2021-01-10 ENCOUNTER — Ambulatory Visit (INDEPENDENT_AMBULATORY_CARE_PROVIDER_SITE_OTHER): Payer: Self-pay | Admitting: Family Medicine

## 2021-01-10 ENCOUNTER — Encounter: Payer: Self-pay | Admitting: Internal Medicine

## 2021-01-10 VITALS — BP 130/80 | HR 110 | Temp 99.1°F | Ht 60.0 in | Wt 282.0 lb

## 2021-01-10 DIAGNOSIS — R0981 Nasal congestion: Secondary | ICD-10-CM

## 2021-01-10 DIAGNOSIS — Z1159 Encounter for screening for other viral diseases: Secondary | ICD-10-CM

## 2021-01-10 DIAGNOSIS — I10 Essential (primary) hypertension: Secondary | ICD-10-CM | POA: Diagnosis not present

## 2021-01-10 DIAGNOSIS — R739 Hyperglycemia, unspecified: Secondary | ICD-10-CM

## 2021-01-10 DIAGNOSIS — R1032 Left lower quadrant pain: Secondary | ICD-10-CM | POA: Diagnosis not present

## 2021-01-10 LAB — POCT INFLUENZA A/B
Influenza A, POC: NEGATIVE
Influenza B, POC: NEGATIVE

## 2021-01-10 LAB — POC COVID19 BINAXNOW: SARS Coronavirus 2 Ag: NEGATIVE

## 2021-01-10 MED ORDER — LEVOFLOXACIN 500 MG PO TABS: 500.0000 mg | ORAL_TABLET | Freq: Every day | ORAL | 0 refills | Status: AC

## 2021-01-10 MED ORDER — METRONIDAZOLE 500 MG PO TABS: 500.0000 mg | ORAL_TABLET | Freq: Three times a day (TID) | ORAL | 0 refills | Status: AC

## 2021-01-10 MED ORDER — LIRAGLUTIDE 18 MG/3ML ~~LOC~~ SOPN: 1.8000 mg | PEN_INJECTOR | Freq: Every day | SUBCUTANEOUS | 11 refills | Status: DC

## 2021-01-10 NOTE — Addendum Note (Signed)
Addended by: Boris Lown B on: 01/10/2021 03:59 PM   Modules accepted: Orders

## 2021-01-10 NOTE — Patient Instructions (Addendum)
Please take all new medication as prescribed - the levaquin and flagyl for antibiotics  Your Covid and flu testing was done today  Please continue all other medications as before, and refills have been done if requested.  Please have the pharmacy call with any other refills you may need.  Please continue your efforts at being more active, low cholesterol diet, and weight control.  Please keep your appointments with your specialists as you may have planned  Please go to the LAB at the blood drawing area for the tests to be done  You will be contacted by phone if any changes need to be made immediately.  Otherwise, you will receive a letter about your results with an explanation, but please check with MyChart first.  Please remember to sign up for MyChart if you have not done so, as this will be important to you in the future with finding out test results, communicating by private email, and scheduling acute appointments online when needed.  Please make an Appointment to return in 6 months, or sooner if needed

## 2021-01-10 NOTE — Progress Notes (Signed)
Patient ID: KAYE LUOMA, female   DOB: 03-14-1945, 75 y.o.   MRN: [id number]       Chief Complaint: follow up acute LLQ pain, URI sumptoms, obesity       HPI:  REMEDY CORPORAN is a 75 y.o. female here with c/o 3 days onset feverish, weakness, LLQ pain and soreness but Denies worsening reflux, dysphagia, n/v, bowel change or blood.  Has hx of diverticulitis and seems very consistent.  Incidentally with mild nasal congestion and slight ST for 3 days.  Pt denies chest pain, increased sob or doe, wheezing, orthopnea, PND, increased LE swelling, palpitations, dizziness or syncope.   Pt denies polydipsia, polyuria   Also just cant seem to lose wt, has heard about victoza, would like to try.         Wt Readings from Last 3 Encounters:  01/10/21 282 lb (127.9 kg)  11/08/20 290 lb 9.6 oz (131.8 kg)  09/26/20 293 lb 6.4 oz (133.1 kg)   BP Readings from Last 3 Encounters:  01/10/21 130/80  11/08/20 (!) 140/92  09/26/20 120/80         Past Medical History:  Diagnosis Date   Allergy    Anxiety    "take RX prn"   Arthritis    "all over"   Asthma    Chronic bronchitis (HCC)    GERD (gastroesophageal reflux disease)    Hypertension    Insomnia 05/31/2011   OSA on CPAP    wears CPAP sometimes (06/23/2014)   Other and unspecified hyperlipidemia 08/04/2012   PONV (postoperative nausea and vomiting)    Shortness of breath dyspnea    with exertion   Shoulder pain, bilateral 10/18/2010   Vitamin D deficiency 12/21/2015   Past Surgical History:  Procedure Laterality Date   APPENDECTOMY  1966   BREAST EXCISIONAL BIOPSY Right    CHOLECYSTECTOMY OPEN  1981   FRACTURE SURGERY     ORIF ANKLE FRACTURE Right 06/23/2014   ORIF ANKLE FRACTURE Right 06/23/2014   Procedure: Take Down [place] Right Ankle, Open Reduction Internal Fixation Right Ankle, Possible Syndesmosis Repair, Medial Joint Debridement;  Surgeon: Newt Minion, MD;  Location: [facility];  Service: Orthopedics;  Laterality: Right;   [place]   w/BSO    reports that she quit smoking about 36 years ago. Her smoking use included cigarettes. She has a 0.20 pack-year smoking history. She has never used smokeless tobacco. She reports that she does not drink alcohol and does not use drugs. family history includes Arthritis in her father, mother, and another family member; Cancer in an other family member; Heart disease in her father and mother; Hypertension in her father and mother. Allergies  Allergen Reactions   Amoxicillin-Pot Clavulanate Hives    Hives   Azithromycin Other (See Comments)    abd pain   Celebrex [Celecoxib] Itching   Lipitor [Atorvastatin] Itching   Current Outpatient Medications on File Prior to Visit  Medication Sig Dispense Refill   albuterol (VENTOLIN HFA) 108 (90 Base) MCG/ACT inhaler Inhale 1-2 puffs into the lungs every 6 (six) hours as needed for wheezing or shortness of breath. 8 g 2   fluticasone furoate-vilanterol (BREO ELLIPTA) 100-25 MCG/ACT AEPB Inhale 1 puff into the lungs daily. 30 each 5   furosemide (LASIX) 80 MG tablet 1 by mouth twice daily as needed for swelling 180 tablet 3   hydrOXYzine (ATARAX/VISTARIL) 25 MG tablet TAKE 1-2 TABS BY MOUTH  THREE TIMES PER DAY AS NEEDED FOR ITCHING 540 tablet 1   meloxicam (MOBIC) 15 MG tablet TAKE 1 TABLET BY MOUTH EVERY DAY AS NEEDED FOR PAIN 90 tablet 1   traMADol (ULTRAM) 50 MG tablet TAKE 1 TABLET (50 MG TOTAL) BY MOUTH 4 (FOUR) TIMES DAILY AS NEEDED. 120 tablet 2   No current facility-administered medications on file prior to visit.        ROS:  All others reviewed and negative.  Objective        PE:  BP 130/80 (BP Location: Left Arm, Patient Position: Sitting, Cuff Size: Large)   Pulse (!) 110   Temp 99.1 F (37.3 C) (Oral)   Ht 5' (1.524 m)   Wt 282 lb (127.9 kg)   SpO2 97%   BMI 55.07 kg/m                 Constitutional: Pt appears in NAD               HENT: Head: NCAT.                Right Ear:  External ear normal.                 Left Ear: External ear normal. Bilat tm's with mild erythema.  Max sinus areas non tender.  Pharynx with mild erythema, no exudate               Eyes: . Pupils are equal, round, and reactive to light. Conjunctivae and EOM are normal               Nose: without d/c or deformity               Neck: Neck supple. Gross normal ROM               Cardiovascular: Normal rate and regular rhythm.                 Pulmonary/Chest: Effort normal and breath sounds without rales or wheezing.                Abd:  Soft, mod tender LLQ, ND, + BS, no organomegaly               Neurological: Pt is alert. At baseline orientation, motor grossly intact               Skin: Skin is warm. No rashes, no other new lesions, LE edema - none               Psychiatric: Pt behavior is normal without agitation   Micro: none  Cardiac tracings I have personally interpreted today:  none  Pertinent Radiological findings (summarize): none   Lab Results  Component Value Date   WBC 7.2 01/10/2021   HGB 13.0 01/10/2021   HCT 40.5 01/10/2021   PLT 352.0 01/10/2021   GLUCOSE 105 (H) 01/10/2021   CHOL 239 (H) 06/06/2020   TRIG 112.0 06/06/2020   HDL 67.90 06/06/2020   LDLDIRECT 134.1 05/02/2012   LDLCALC 149 (H) 06/06/2020   ALT 10 01/10/2021   AST 17 01/10/2021   NA 140 01/10/2021   K 4.2 01/10/2021   CL 104 01/10/2021   CREATININE 0.93 01/10/2021   BUN 13 01/10/2021   CO2 30 01/10/2021   TSH 1.57 06/06/2020   INR 1.05 06/22/2014   HGBA1C 6.0 01/10/2021   Influenza A, POC Negative Negative   Influenza B, POC Negative Negative  SARS Coronavirus 2 Ag Negative Negative    Assessment/Plan:  TASHEKA HOUSEMAN is a 75 y.o. Black or African American [2] female with  has a past medical history of Allergy, Anxiety, Arthritis, Asthma, Chronic bronchitis ([place]), GERD (gastroesophageal reflux disease), Hypertension, Insomnia (05/31/2011), OSA on CPAP, Other and unspecified hyperlipidemia  (08/04/2012), PONV (postoperative nausea and vomiting), Shortness of breath dyspnea, Shoulder pain, bilateral (10/18/2010), and Vitamin D deficiency (12/21/2015).  Hypertension BP Readings from Last 3 Encounters:  01/10/21 130/80  11/08/20 (!) 140/92  09/26/20 120/80   Stable, pt to continue medical treatment lasix   Hyperglycemia Lab Results  Component Value Date   HGBA1C 6.0 01/10/2021   Stable, pt to start medical treatment  - new victoza  Morbid obesity ([place]) Unable to lose significant wt - for victoza asd,  to f/u any worsening symptoms or concerns  LLQ pain High suspicion for recurrent acute diverticulitis - mod, for antibx course - levaquin and flagyl,  to f/u any worsening symptoms or concerns - declines CT but or lab today including cbc  Nasal congestion Incidental, likely viral URI - covid and flu neg, for mucinex and supportive care,  to f/u any worsening symptoms or concerns  Followup: No follow-ups on file.  Cathlean Cower, MD 01/15/2021 3:16 PM [facility]

## 2021-01-11 LAB — CBC WITH DIFFERENTIAL/PLATELET
Basophils Absolute: 0.1 10*3/uL (ref 0.0–0.1)
Basophils Relative: 0.8 % (ref 0.0–3.0)
Eosinophils Absolute: 0.6 10*3/uL (ref 0.0–0.7)
Eosinophils Relative: 8.4 % — ABNORMAL HIGH (ref 0.0–5.0)
HCT: 40.5 % (ref 36.0–46.0)
Hemoglobin: 13 g/dL (ref 12.0–15.0)
Lymphocytes Relative: 30.8 % (ref 12.0–46.0)
Lymphs Abs: 2.2 10*3/uL (ref 0.7–4.0)
MCHC: 32.1 g/dL (ref 30.0–36.0)
MCV: 88.5 fl (ref 78.0–100.0)
Monocytes Absolute: 0.6 10*3/uL (ref 0.1–1.0)
Monocytes Relative: 8.1 % (ref 3.0–12.0)
Neutro Abs: 3.8 10*3/uL (ref 1.4–7.7)
Neutrophils Relative %: 51.9 % (ref 43.0–77.0)
Platelets: 352 10*3/uL (ref 150.0–400.0)
RBC: 4.58 Mil/uL (ref 3.87–5.11)
RDW: 15.4 % (ref 11.5–15.5)
WBC: 7.2 10*3/uL (ref 4.0–10.5)

## 2021-01-11 LAB — URINALYSIS, ROUTINE W REFLEX MICROSCOPIC
Bilirubin Urine: NEGATIVE
Hgb urine dipstick: NEGATIVE
Ketones, ur: NEGATIVE
Leukocytes,Ua: NEGATIVE
Nitrite: NEGATIVE
RBC / HPF: NONE SEEN (ref 0–?)
Specific Gravity, Urine: 1.02 (ref 1.000–1.030)
Total Protein, Urine: NEGATIVE
Urine Glucose: NEGATIVE
Urobilinogen, UA: 0.2 (ref 0.0–1.0)
pH: 6 (ref 5.0–8.0)

## 2021-01-11 LAB — BASIC METABOLIC PANEL
BUN: 13 mg/dL (ref 6–23)
CO2: 30 mEq/L (ref 19–32)
Calcium: 10.4 mg/dL (ref 8.4–10.5)
Chloride: 104 mEq/L (ref 96–112)
Creatinine, Ser: 0.93 mg/dL (ref 0.40–1.20)
GFR: 59.66 mL/min — ABNORMAL LOW (ref >60.00)
Glucose, Bld: 105 mg/dL — ABNORMAL HIGH (ref 70–99)
Potassium: 4.2 mEq/L (ref 3.5–5.1)
Sodium: 140 mEq/L (ref 135–145)

## 2021-01-11 LAB — HEMOGLOBIN A1C: Hgb A1c MFr Bld: 6 % (ref 4.6–6.5)

## 2021-01-11 LAB — HEPATIC FUNCTION PANEL
ALT: 10 U/L (ref 0–35)
AST: 17 U/L (ref 0–37)
Albumin: 3.8 g/dL (ref 3.5–5.2)
Alkaline Phosphatase: 93 U/L (ref 39–117)
Bilirubin, Direct: 0 mg/dL (ref 0.0–0.3)
Total Bilirubin: 0.3 mg/dL (ref 0.2–1.2)
Total Protein: 8 g/dL (ref 6.0–8.3)

## 2021-01-11 LAB — LIPASE: Lipase: 27 U/L (ref 11.0–59.0)

## 2021-01-11 LAB — HEPATITIS C ANTIBODY
Hepatitis C Ab: NONREACTIVE
SIGNAL TO CUT-OFF: 0.19 (ref <1.00)

## 2021-01-12 ENCOUNTER — Encounter: Payer: Self-pay | Admitting: Internal Medicine

## 2021-01-15 DIAGNOSIS — R0981 Nasal congestion: Secondary | ICD-10-CM | POA: Insufficient documentation

## 2021-01-15 DIAGNOSIS — R1032 Left lower quadrant pain: Secondary | ICD-10-CM | POA: Insufficient documentation

## 2021-01-15 NOTE — Assessment & Plan Note (Signed)
Incidental, likely viral URI - covid and flu neg, for mucinex and supportive care,  to f/u any worsening symptoms or concerns

## 2021-01-15 NOTE — Assessment & Plan Note (Signed)
BP Readings from Last 3 Encounters:  01/10/21 130/80  11/08/20 (!) 140/92  09/26/20 120/80   Stable, pt to continue medical treatment lasix

## 2021-01-15 NOTE — Assessment & Plan Note (Addendum)
High suspicion for recurrent acute diverticulitis - mod, for antibx course - levaquin and flagyl,  to f/u any worsening symptoms or concerns - declines CT but or lab today including cbc

## 2021-01-15 NOTE — Assessment & Plan Note (Signed)
Lab Results  Component Value Date   HGBA1C 6.0 01/10/2021   Stable, pt to start medical treatment  - new victoza

## 2021-01-15 NOTE — Assessment & Plan Note (Signed)
Unable to lose significant wt - for victoza asd,  to f/u any worsening symptoms or concerns

## 2021-01-24 ENCOUNTER — Ambulatory Visit (INDEPENDENT_AMBULATORY_CARE_PROVIDER_SITE_OTHER): Payer: Self-pay | Admitting: Family Medicine

## 2021-01-25 ENCOUNTER — Other Ambulatory Visit: Payer: Self-pay | Admitting: Adult Health

## 2021-01-26 ENCOUNTER — Other Ambulatory Visit: Payer: Self-pay | Admitting: Internal Medicine

## 2021-03-09 ENCOUNTER — Telehealth: Payer: Self-pay

## 2021-03-09 NOTE — Telephone Encounter (Signed)
Patient declines coming in office for visit. Advised patient to go to the ED if symptoms worsen. Patient states that she does not want to go to the ED or come here due to excessive vomiting. Explained to patient that we have emesis bags and supplies to help her with the vomiting and that she should most likely go to the ED for treatment. Patient said "ok" and ended the call.

## 2021-03-09 NOTE — Telephone Encounter (Signed)
Pt is calling for a refill on: methylPREDNISolone (MEDROL DOSEPAK) 4 MG TBPK tablet  Pharmacy: [id number] - [place], [address]  LOV 01/10/21 [facility])  This medication is a discontinued medication.

## 2021-03-09 NOTE — Telephone Encounter (Signed)
Spoke with patient. Not requesting steroid pack; patient requesting refill of Metronidazole and states that symptoms are not cleared. She is having left side abdominal pain, nausea, and vomiting as before. Denies any fever or chills. Patient was seen 01/10/21 for this

## 2021-03-09 NOTE — Telephone Encounter (Signed)
Pt really should not need further antibx, pt to consider OV or ED if feel she is getting worse

## 2021-04-04 ENCOUNTER — Ambulatory Visit (HOSPITAL_COMMUNITY)
Admission: EM | Admit: 2021-04-04 | Discharge: 2021-04-04 | Disposition: A | Discharge status: Home / Self Care | Payer: No Typology Code available for payment source | Attending: Urgent Care | Admitting: Urgent Care

## 2021-04-04 ENCOUNTER — Ambulatory Visit: Payer: Self-pay | Admitting: *Deleted

## 2021-04-04 ENCOUNTER — Encounter (HOSPITAL_COMMUNITY): Payer: Self-pay

## 2021-04-04 DIAGNOSIS — G8929 Other chronic pain: Secondary | ICD-10-CM | POA: Diagnosis not present

## 2021-04-04 DIAGNOSIS — R1032 Left lower quadrant pain: Secondary | ICD-10-CM

## 2021-04-04 DIAGNOSIS — I10 Essential (primary) hypertension: Secondary | ICD-10-CM

## 2021-04-04 DIAGNOSIS — R030 Elevated blood-pressure reading, without diagnosis of hypertension: Secondary | ICD-10-CM

## 2021-04-04 DIAGNOSIS — R1030 Lower abdominal pain, unspecified: Secondary | ICD-10-CM

## 2021-04-04 MED ORDER — ONDANSETRON 8 MG PO TBDP: 8.0000 mg | ORAL_TABLET | Freq: Three times a day (TID) | ORAL | 0 refills | Status: DC | PRN

## 2021-04-04 MED ORDER — ACETAMINOPHEN 325 MG PO TABS: 650.0000 mg | ORAL_TABLET | Freq: Four times a day (QID) | ORAL | 0 refills | Status: DC | PRN

## 2021-04-04 NOTE — ED Triage Notes (Signed)
Pt presents with LLQ abdominal pain with some nausea, vomiting and diarrhea X 3 weeks.

## 2021-04-04 NOTE — ED Provider Notes (Signed)
[place]   MRN: [id number] DOB: Nov 27, 1945  Subjective:   Betty Green is a 76 y.o. female presenting for acute on chronic left lower abdominal pain.  She reports that she is now having dark stools.  She does have a PCP, has had work-up that has been negative.  It was recommended that she see a gastroenterologist.  States that she is try to make appointments but want something for pain now.  Denies any active pain in the clinic.  No frank bloody stools but does report dark stools.  She is also had nausea and vomiting, has about 2 episodes daily more consistently at night.  Also has 1 episode of diarrhea daily.  No history of GI issues.  She has gotten her colonoscopies and states that they have always been normal.  She does take tramadol daily for chronic bilateral knee pain, this is secondary to arthritis.  No recent antibiotic use.  No current facility-administered medications for this encounter.  Current Outpatient Medications:    albuterol (VENTOLIN HFA) 108 (90 Base) MCG/ACT inhaler, INHALE 1-2 PUFFS BY MOUTH EVERY 6 HOURS AS NEEDED FOR WHEEZE OR SHORTNESS OF BREATH, Disp: 8.5 each, Rfl: 2   fluticasone furoate-vilanterol (BREO ELLIPTA) 100-25 MCG/ACT AEPB, Inhale 1 puff into the lungs daily., Disp: 30 each, Rfl: 5   furosemide (LASIX) 80 MG tablet, 1 by mouth twice daily as needed for swelling, Disp: 180 tablet, Rfl: 3   hydrOXYzine (ATARAX/VISTARIL) 25 MG tablet, TAKE 1-2 TABS BY MOUTH THREE TIMES PER DAY AS NEEDED FOR ITCHING, Disp: 540 tablet, Rfl: 1   liraglutide (VICTOZA) 18 MG/3ML SOPN, Inject 1.8 mg into the skin daily., Disp: 30 mL, Rfl: 11   meloxicam (MOBIC) 15 MG tablet, TAKE 1 TABLET BY MOUTH EVERY DAY AS NEEDED FOR PAIN, Disp: 90 tablet, Rfl: 1   traMADol (ULTRAM) 50 MG tablet, TAKE 1 TABLET (50 MG TOTAL) BY MOUTH 4 (FOUR) TIMES DAILY AS NEEDED., Disp: 120 tablet, Rfl: 2   Allergies  Allergen Reactions   Amoxicillin-Pot Clavulanate Hives    Hives    Azithromycin Other (See Comments)    abd pain   Celebrex [Celecoxib] Itching   Lipitor [Atorvastatin] Itching    Past Medical History:  Diagnosis Date   Allergy    Anxiety    "take RX prn"   Arthritis    "all over"   Asthma    Chronic bronchitis (HCC)    GERD (gastroesophageal reflux disease)    Hypertension    Insomnia 05/31/2011   OSA on CPAP    wears CPAP sometimes (06/23/2014)   Other and unspecified hyperlipidemia 08/04/2012   PONV (postoperative nausea and vomiting)    Shortness of breath dyspnea    with exertion   Shoulder pain, bilateral 10/18/2010   Vitamin D deficiency 12/21/2015     Past Surgical History:  Procedure Laterality Date   APPENDECTOMY  1966   BREAST EXCISIONAL BIOPSY Right    CHOLECYSTECTOMY OPEN  1981   FRACTURE SURGERY     ORIF ANKLE FRACTURE Right 06/23/2014   ORIF ANKLE FRACTURE Right 06/23/2014   Procedure: Take Down Malunion Right Ankle, Open Reduction Internal Fixation Right Ankle, Possible Syndesmosis Repair, Medial Joint Debridement;  Surgeon: Newt Minion, MD;  Location: [place];  Service: Orthopedics;  Laterality: Right;   TONSILLECTOMY  1963   TOTAL ABDOMINAL HYSTERECTOMY  1980   w/BSO    Family History  Problem Relation Age of Onset   Arthritis Other  Arthritis Mother    Heart disease Mother    Hypertension Mother    Arthritis Father    Heart disease Father    Hypertension Father    Cancer Other        colon and prostate cancer    Social History   Tobacco Use   Smoking status: Former    Packs/day: 0.10    Years: 2.00    Pack years: 0.20    Types: Cigarettes    Quit date: 02/06/1984    Years since quitting: 37.1   Smokeless tobacco: Never  Substance Use Topics   Alcohol use: No   Drug use: No    ROS   Objective:   Vitals: BP (!) 170/92 (BP Location: Left Arm)    Pulse (!) 106    Temp 98.7 F (37.1 C) (Oral)    Resp 17    SpO2 95%   BP Readings from Last 3 Encounters:  04/04/21 (!) 170/92  01/10/21 130/80   11/08/20 (!) 140/92   Physical Exam Constitutional:      General: She is not in acute distress.    Appearance: Normal appearance. She is well-developed. She is not ill-appearing, toxic-appearing or diaphoretic.  HENT:     Head: Normocephalic and atraumatic.     Nose: Nose normal.     Mouth/Throat:     Mouth: Mucous membranes are moist.  Eyes:     General: No scleral icterus.       Right eye: No discharge.        Left eye: No discharge.     Extraocular Movements: Extraocular movements intact.  Cardiovascular:     Rate and Rhythm: Normal rate.  Pulmonary:     Effort: Pulmonary effort is normal.  Skin:    General: Skin is warm and dry.  Neurological:     General: No focal deficit present.     Mental Status: She is alert and oriented to person, place, and time.  Psychiatric:        Mood and Affect: Mood normal.        Behavior: Behavior normal.   I was unable to perform a reliable abdominal exam as patient did not want to get onto the exam table.  Through the entirety of her visit she preferred to sit in the patient chair.   Assessment and Plan :   I have reviewed the PDMP during this encounter.  1. Abdominal pain, chronic, left lower quadrant   2. Lower abdominal pain   3. Essential hypertension   4. Elevated blood pressure reading    Patient was asymptomatic in clinic.  I was unable to perform an abdominal exam as aforementioned.  Recommended an evaluation through gastroenterology as this was also advised by her PCP.  I attempted a referral to [facility] but patient wanted another option and therefore recommended [facility] gastroenterology as well.  In the meantime, recommended revisiting her use of tramadol which could contribute to her GI symptoms.  Advised that she discuss this with her PCP.  Otherwise, use supportive care. Counseled patient on potential for adverse effects with medications prescribed/recommended today, ER and return-to-clinic precautions discussed, patient  verbalized understanding.    Jaynee Eagles, PA-C 04/04/21 1401

## 2021-04-04 NOTE — Telephone Encounter (Signed)
Pt of Dr. Jenny Reichmann. Agent states pt called for GI referral, none available until May. Pt transferred to [facility]. Requesting GI appt today. Advised to alert Dr. Jenny Reichmann of request.  CAlled  practice, Oct[facility]ckner? ) alerted of issue. States will reach out to patient.

## 2021-04-06 ENCOUNTER — Other Ambulatory Visit: Payer: Self-pay | Admitting: Gastroenterology

## 2021-04-06 DIAGNOSIS — R1032 Left lower quadrant pain: Secondary | ICD-10-CM

## 2021-04-13 ENCOUNTER — Telehealth: Payer: Self-pay | Admitting: Internal Medicine

## 2021-04-13 NOTE — Telephone Encounter (Signed)
Patient calling in ? ?Patient would like cb from nurse regarding recent test results ? ?Please call [phone] ?

## 2021-04-14 ENCOUNTER — Ambulatory Visit: Payer: No Typology Code available for payment source | Admitting: Internal Medicine

## 2021-04-14 NOTE — Telephone Encounter (Signed)
Patient wanting to discuss lab results that were done at [facility]. Per patient, they told her that her kidney function is low and to f/u with PCP. Advised patient that we are not linked with [facility] EMR and that they would need to fax a copy of lab results and she can then call back to schedule an appt to discuss kidney function.  ?

## 2021-04-19 ENCOUNTER — Telehealth (INDEPENDENT_AMBULATORY_CARE_PROVIDER_SITE_OTHER): Payer: No Typology Code available for payment source | Admitting: Internal Medicine

## 2021-04-19 DIAGNOSIS — E559 Vitamin D deficiency, unspecified: Secondary | ICD-10-CM | POA: Diagnosis not present

## 2021-04-19 DIAGNOSIS — N179 Acute kidney failure, unspecified: Secondary | ICD-10-CM | POA: Diagnosis not present

## 2021-04-19 DIAGNOSIS — R739 Hyperglycemia, unspecified: Secondary | ICD-10-CM | POA: Diagnosis not present

## 2021-04-19 NOTE — Progress Notes (Signed)
Patient ID: Betty Green, female   DOB: Jun 21, 1945, 76 y.o.   MRN: [id number] ? ?Virtual Visit via Video Note ? ?I connected with [name] on Apr 19, 2021 at  2:00 PM EDT by a video enabled telemedicine application and verified that I am speaking with the correct person using two identifiers. ? ?Location of all participants today ?Patient: at home ?Provider: at office ?  ?I discussed the limitations of evaluation and management by telemedicine and the availability of in person appointments. The patient expressed understanding and agreed to proceed. ? ?History of Present Illness: ?Here to f/u after recent lab per [facility] c/w AKI per pt, details not in hand today, and mobic stopped x 3 wks.  Pt denies chest pain, increased sob or doe, wheezing, orthopnea, PND, increased LE swelling, palpitations, dizziness or syncope.   Pt denies polydipsia, polyuria, or new focal neuro s/s.    Pt denies fever, wt loss, night sweats, loss of appetite, or other constitutional symptoms   no other new complaints ?  ?Past Medical History:  ?Diagnosis Date  ? Allergy   ? Anxiety   ? "take RX prn"  ? Arthritis   ? "all over"  ? Asthma   ? Chronic bronchitis ([place])   ? GERD (gastroesophageal reflux disease)   ? Hypertension   ? Insomnia 05/31/2011  ? OSA on CPAP   ? wears CPAP sometimes (06/23/2014)  ? Other and unspecified hyperlipidemia 08/04/2012  ? PONV (postoperative nausea and vomiting)   ? Shortness of breath dyspnea   ? with exertion  ? Shoulder pain, bilateral 10/18/2010  ? Vitamin D deficiency 12/21/2015  ? ?Past Surgical History:  ?Procedure Laterality Date  ? APPENDECTOMY  1966  ? BREAST EXCISIONAL BIOPSY Right   ? CHOLECYSTECTOMY OPEN  1981  ? FRACTURE SURGERY    ? ORIF ANKLE FRACTURE Right 06/23/2014  ? ORIF ANKLE FRACTURE Right 06/23/2014  ? Procedure: Take Down Malunion Right Ankle, Open Reduction Internal Fixation Right Ankle, Possible Syndesmosis Repair, Medial Joint Debridement;  Surgeon: Newt Minion, MD;  Location: [facility];   Service: Orthopedics;  Laterality: Right;  ? TONSILLECTOMY  1963  ? TOTAL ABDOMINAL HYSTERECTOMY  1980  ? w/BSO  ? ? reports that she quit smoking about 37 years ago. Her smoking use included cigarettes. She has a 0.20 pack-year smoking history. She has never used smokeless tobacco. She reports that she does not drink alcohol and does not use drugs. ?family history includes Arthritis in her father, mother, and another family member; Cancer in an other family member; Heart disease in her father and mother; Hypertension in her father and mother. ?Allergies  ?Allergen Reactions  ? Amoxicillin-Pot Clavulanate Hives  ?  Hives  ? Azithromycin Other (See Comments)  ?  abd pain  ? Celebrex [Celecoxib] Itching  ? Lipitor [Atorvastatin] Itching  ? ?Current Outpatient Medications on File Prior to Visit  ?Medication Sig Dispense Refill  ? acetaminophen (TYLENOL) 325 MG tablet Take 2 tablets (650 mg total) by mouth every 6 (six) hours as needed for moderate pain. 30 tablet 0  ? albuterol (VENTOLIN HFA) 108 (90 Base) MCG/ACT inhaler INHALE 1-2 PUFFS BY MOUTH EVERY 6 HOURS AS NEEDED FOR WHEEZE OR SHORTNESS OF BREATH 8.5 each 2  ? fluticasone furoate-vilanterol (BREO ELLIPTA) 100-25 MCG/ACT AEPB Inhale 1 puff into the lungs daily. 30 each 5  ? furosemide (LASIX) 80 MG tablet 1 by mouth twice daily as needed for swelling 180 tablet 3  ?  hydrOXYzine (ATARAX/VISTARIL) 25 MG tablet TAKE 1-2 TABS BY MOUTH THREE TIMES PER DAY AS NEEDED FOR ITCHING 540 tablet 1  ? liraglutide (VICTOZA) 18 MG/3ML SOPN Inject 1.8 mg into the skin daily. 30 mL 11  ? ondansetron (ZOFRAN-ODT) 8 MG disintegrating tablet Take 1 tablet (8 mg total) by mouth every 8 (eight) hours as needed for nausea or vomiting. 20 tablet 0  ? traMADol (ULTRAM) 50 MG tablet TAKE 1 TABLET (50 MG TOTAL) BY MOUTH 4 (FOUR) TIMES DAILY AS NEEDED. 120 tablet 2  ? ?No current facility-administered medications on file prior to visit.  ? ?Observations/Objective: ?Alert, NAD, appropriate  mood and affect, resps normal, cn 2-12 intact, moves all 4s, no visible rash or swelling ?Lab Results  ?Component Value Date  ? WBC 7.2 01/10/2021  ? HGB 13.0 01/10/2021  ? HCT 40.5 01/10/2021  ? PLT 352.0 01/10/2021  ? GLUCOSE 105 (H) 01/10/2021  ? CHOL 239 (H) 06/06/2020  ? TRIG 112.0 06/06/2020  ? HDL 67.90 06/06/2020  ? LDLDIRECT 134.1 05/02/2012  ? LDLCALC 149 (H) 06/06/2020  ? ALT 10 01/10/2021  ? AST 17 01/10/2021  ? NA 140 01/10/2021  ? K 4.2 01/10/2021  ? CL 104 01/10/2021  ? CREATININE 0.93 01/10/2021  ? BUN 13 01/10/2021  ? CO2 30 01/10/2021  ? TSH 1.57 06/06/2020  ? INR 1.05 06/22/2014  ? HGBA1C 6.0 01/10/2021  ? ?Assessment and Plan: ?See notes ? ?Follow Up Instructions: ?See notes ?  ?I discussed the assessment and treatment plan with the patient. The patient was provided an opportunity to ask questions and all were answered. The patient agreed with the plan and demonstrated an understanding of the instructions. ?  ?The patient was advised to call back or seek an in-person evaluation if the symptoms worsen or if the condition fails to improve as anticipated. ? ? ? ?Cathlean Cower, MD ? ?

## 2021-04-20 ENCOUNTER — Encounter: Payer: Self-pay | Admitting: Internal Medicine

## 2021-04-20 NOTE — Assessment & Plan Note (Signed)
Lab Results  ?Component Value Date  ? HGBA1C 6.0 01/10/2021  ? ?Stable, pt to continue current medical treatment  - diet and victoza ? ?

## 2021-04-20 NOTE — Assessment & Plan Note (Signed)
Mild worsening recent per pt report, will need to seek detailed recent lab per [facility], mobic stopped, and will need f/u Lab tomorrow , consider further eval if remains over baseline Cr such as u/s or renal referral ?

## 2021-04-20 NOTE — Assessment & Plan Note (Signed)
Last vitamin D ?Lab Results  ?Component Value Date  ? VD25OH 26.11 (L) 06/06/2020  ? ?Low, reminded to start oral replacement ? ?

## 2021-04-20 NOTE — Patient Instructions (Signed)
Please continue all other medications as before, and refills have been done if requested. ? ?Please have the pharmacy call with any other refills you may need. ? ?Please continue your efforts at being more active, low cholesterol diet, and weight control. ? ?Please keep your appointments with your specialists as you may have planned ? ?Please go to the LAB at the blood drawing area for the tests to be done at the [facility] as you are next able ?

## 2021-05-01 ENCOUNTER — Other Ambulatory Visit: Payer: No Typology Code available for payment source

## 2021-05-09 ENCOUNTER — Inpatient Hospital Stay: Admission: RE | Admit: 2021-05-09 | Payer: No Typology Code available for payment source | Source: Ambulatory Visit

## 2021-05-16 ENCOUNTER — Other Ambulatory Visit (INDEPENDENT_AMBULATORY_CARE_PROVIDER_SITE_OTHER): Payer: No Typology Code available for payment source

## 2021-05-16 DIAGNOSIS — N179 Acute kidney failure, unspecified: Secondary | ICD-10-CM

## 2021-05-16 LAB — BASIC METABOLIC PANEL
BUN: 20 mg/dL (ref 6–23)
CO2: 23 mEq/L (ref 19–32)
Calcium: 10.3 mg/dL (ref 8.4–10.5)
Chloride: 107 mEq/L (ref 96–112)
Creatinine, Ser: 0.96 mg/dL (ref 0.40–1.20)
GFR: 57.29 mL/min — ABNORMAL LOW (ref >60.00)
Glucose, Bld: 115 mg/dL — ABNORMAL HIGH (ref 70–99)
Potassium: 4.3 mEq/L (ref 3.5–5.1)
Sodium: 141 mEq/L (ref 135–145)

## 2021-05-18 ENCOUNTER — Encounter: Payer: Self-pay | Admitting: Internal Medicine

## 2021-05-18 LAB — URINALYSIS, ROUTINE W REFLEX MICROSCOPIC
Bilirubin Urine: NEGATIVE
Hgb urine dipstick: NEGATIVE
Ketones, ur: NEGATIVE
Leukocytes,Ua: NEGATIVE
Nitrite: NEGATIVE
RBC / HPF: NONE SEEN (ref 0–?)
Specific Gravity, Urine: 1.02 (ref 1.000–1.030)
Total Protein, Urine: NEGATIVE
Urine Glucose: NEGATIVE
Urobilinogen, UA: 0.2 (ref 0.0–1.0)
pH: 6 (ref 5.0–8.0)

## 2021-05-19 MED ORDER — OZEMPIC (0.25 OR 0.5 MG/DOSE) 2 MG/1.5ML ~~LOC~~ SOPN: 0.5000 mg | PEN_INJECTOR | SUBCUTANEOUS | 3 refills | Status: DC

## 2021-05-19 NOTE — Telephone Encounter (Signed)
Ok to let pt know ? ?Ok for ozempic, but would need to stop the trulicity if she starts the ozempic ?

## 2021-05-29 ENCOUNTER — Other Ambulatory Visit: Payer: No Typology Code available for payment source

## 2021-05-30 ENCOUNTER — Other Ambulatory Visit: Payer: No Typology Code available for payment source

## 2021-06-09 ENCOUNTER — Telehealth: Payer: Self-pay | Admitting: Internal Medicine

## 2021-06-09 NOTE — Telephone Encounter (Signed)
Recv'd records from [facility] forwarded 2 pages to Dr. Cathlean Cower 5/5/23fbg ?

## 2021-06-13 ENCOUNTER — Other Ambulatory Visit: Payer: Self-pay | Admitting: Internal Medicine

## 2021-06-16 ENCOUNTER — Other Ambulatory Visit: Payer: Medicare PPO

## 2021-06-28 ENCOUNTER — Telehealth: Payer: Self-pay | Admitting: Internal Medicine

## 2021-06-28 NOTE — Telephone Encounter (Signed)
LVM for pt to rtn my call to schedule AWV with [facility]. Call back # [phone] 

## 2021-07-10 ENCOUNTER — Ambulatory Visit
Admission: RE | Admit: 2021-07-10 | Discharge: 2021-07-10 | Disposition: A | Discharge status: Home / Self Care | Payer: Medicare PPO | Source: Ambulatory Visit | Attending: Gastroenterology | Admitting: Gastroenterology

## 2021-07-10 DIAGNOSIS — R1032 Left lower quadrant pain: Secondary | ICD-10-CM

## 2021-07-10 DIAGNOSIS — R197 Diarrhea, unspecified: Secondary | ICD-10-CM | POA: Diagnosis not present

## 2021-07-10 MED ORDER — IOPAMIDOL (ISOVUE-300) INJECTION 61%
100.0000 mL | Freq: Once | INTRAVENOUS | Status: AC | PRN
  Administered 2021-07-10: 100 mL via INTRAVENOUS

## 2021-07-11 ENCOUNTER — Ambulatory Visit: Payer: Medicare Other | Admitting: Internal Medicine

## 2021-08-16 DIAGNOSIS — M19012 Primary osteoarthritis, left shoulder: Secondary | ICD-10-CM | POA: Diagnosis not present

## 2021-08-16 DIAGNOSIS — M19011 Primary osteoarthritis, right shoulder: Secondary | ICD-10-CM | POA: Diagnosis not present

## 2021-08-18 ENCOUNTER — Ambulatory Visit: Payer: Medicare PPO

## 2021-08-18 ENCOUNTER — Telehealth: Payer: Self-pay

## 2021-08-18 NOTE — Telephone Encounter (Signed)
Called patient; mailbox full.  If patient calls back to office please reschedule AWV-S.

## 2021-09-13 ENCOUNTER — Encounter (INDEPENDENT_AMBULATORY_CARE_PROVIDER_SITE_OTHER): Payer: Self-pay

## 2021-10-03 ENCOUNTER — Other Ambulatory Visit: Payer: Self-pay | Admitting: Internal Medicine

## 2021-11-03 ENCOUNTER — Ambulatory Visit (INDEPENDENT_AMBULATORY_CARE_PROVIDER_SITE_OTHER): Payer: Medicare PPO

## 2021-11-03 VITALS — Wt 277.0 lb

## 2021-11-03 DIAGNOSIS — Z1231 Encounter for screening mammogram for malignant neoplasm of breast: Secondary | ICD-10-CM | POA: Diagnosis not present

## 2021-11-03 DIAGNOSIS — Z780 Asymptomatic menopausal state: Secondary | ICD-10-CM

## 2021-11-03 DIAGNOSIS — Z0000 Encounter for general adult medical examination without abnormal findings: Secondary | ICD-10-CM | POA: Diagnosis not present

## 2021-11-03 NOTE — Patient Instructions (Addendum)
[name] , Thank you for taking time to come for your Medicare Wellness Visit. I appreciate your ongoing commitment to your health goals. Please review the following plan we discussed and let me know if I can assist you in the future.   These are the goals we discussed:  Goals      Patient Stated     Hopes to lose weight so she can get knees operated on and get more active        This is a list of the screening recommended for you and due dates:  Health Maintenance  Topic Date Due   Zoster (Shingles) Vaccine (1 of 2) Never done   DEXA scan (bone density measurement)  02/08/2019   COVID-19 Vaccine (4 - Pfizer risk series) 05/23/2020   Mammogram  06/18/2020   Tetanus Vaccine  10/17/2020   Flu Shot  09/05/2021   Pneumonia Vaccine  Completed   Hepatitis C Screening: USPSTF Recommendation to screen - Ages 18-79 yo.  Completed   HPV Vaccine  Aged Out   Cologuard (Stool DNA test)  Discontinued    Advanced directives: Advance directive discussed with you today. Even though you declined this today, please call our office should you change your mind, and we can give you the proper paperwork for you to fill out.   Conditions/risks identified: Each day, aim for 6 glasses of water, plenty of protein in your diet and try to get up and walk/ stretch every hour for 5-10 minutes at a time.    Next appointment: Follow up in one year for your annual wellness visit   Managing Pain Without Opioids Opioids are strong medicines used to treat moderate to severe pain. For some people, especially those who have long-term (chronic) pain, opioids may not be the best choice for pain management due to: Side effects like nausea, constipation, and sleepiness. The risk of addiction (opioid use disorder). The longer you take opioids, the greater your risk of addiction. Pain that lasts for more than 3 months is called chronic pain. Managing chronic pain usually requires more than one approach and is often  provided by a team of health care providers working together (multidisciplinary approach). Pain management may be done at a pain management center or pain clinic. How to manage pain without the use of opioids Use non-opioid medicines Non-opioid medicines for pain may include: Over-the-counter or prescription non-steroidal anti-inflammatory drugs (NSAIDs). These may be the first medicines used for pain. They work well for muscle and bone pain, and they reduce swelling. Acetaminophen. This over-the-counter medicine may work well for milder pain but not swelling. Antidepressants. These may be used to treat chronic pain. A certain type of antidepressant (tricyclics) is often used. These medicines are given in lower doses for pain than when used for depression. Anticonvulsants. These are usually used to treat seizures but may also reduce nerve (neuropathic) pain. Muscle relaxants. These relieve pain caused by sudden muscle tightening (spasms). You may also use a pain medicine that is applied to the skin as a patch, cream, or gel (topical analgesic), such as a numbing medicine. These may cause fewer side effects than medicines taken by mouth. Do certain therapies as directed Some therapies can help with pain management. They include: Physical therapy. You will do exercises to gain strength and flexibility. A physical therapist may teach you exercises to move and stretch parts of your body that are weak, stiff, or painful. You can learn these exercises at physical therapy visits and  practice them at home. Physical therapy may also involve: Massage. Heat wraps or applying heat or cold to affected areas. Electrical signals that interrupt pain signals (transcutaneous electrical nerve stimulation, TENS). Weak lasers that reduce pain and swelling (low-level laser therapy). Signals from your body that help you learn to regulate pain (biofeedback). Occupational therapy. This helps you to learn ways to function  at home and work with less pain. Recreational therapy. This involves trying new activities or hobbies, such as a physical activity or drawing. Mental health therapy, including: Cognitive behavioral therapy (CBT). This helps you learn coping skills for dealing with pain. Acceptance and commitment therapy (ACT) to change the way you think and react to pain. Relaxation therapies, including muscle relaxation exercises and mindfulness-based stress reduction. Pain management counseling. This may be individual, family, or group counseling.  Receive medical treatments Medical treatments for pain management include: Nerve block injections. These may include a pain blocker and anti-inflammatory medicines. You may have injections: Near the spine to relieve chronic back or neck pain. Into joints to relieve back or joint pain. Into nerve areas that supply a painful area to relieve body pain. Into muscles (trigger point injections) to relieve some painful muscle conditions. A medical device placed near your spine to help block pain signals and relieve nerve pain or chronic back pain (spinal cord stimulation device). Acupuncture. Follow these instructions at home Medicines Take over-the-counter and prescription medicines only as told by your health care provider. If you are taking pain medicine, ask your health care providers about possible side effects to watch out for. Do not drive or use heavy machinery while taking prescription opioid pain medicine. Lifestyle  Do not use drugs or alcohol to reduce pain. If you drink alcohol, limit how much you have to: 0-1 drink a day for women who are not pregnant. 0-2 drinks a day for men. Know how much alcohol is in a drink. In the [place] one drink equals one 12 oz bottle of beer (355 mL), one 5 oz glass of wine (148 mL), or one 1 oz glass of hard liquor (44 mL). Do not use any products that contain nicotine or tobacco. These products include cigarettes, chewing  tobacco, and vaping devices, such as e-cigarettes. If you need help quitting, ask your health care provider. Eat a healthy diet and maintain a healthy weight. Poor diet and excess weight may make pain worse. Eat foods that are high in fiber. These include fresh fruits and vegetables, whole grains, and beans. Limit foods that are high in fat and processed sugars, such as fried and sweet foods. Exercise regularly. Exercise lowers stress and may help relieve pain. Ask your health care provider what activities and exercises are safe for you. If your health care provider approves, join an exercise class that combines movement and stress reduction. Examples include yoga and tai chi. Get enough sleep. Lack of sleep may make pain worse. Lower stress as much as possible. Practice stress reduction techniques as told by your therapist. General instructions Work with all your pain management providers to find the treatments that work best for you. You are an important member of your pain management team. There are many things you can do to reduce pain on your own. Consider joining an online or in-person support group for people who have chronic pain. Keep all follow-up visits. This is important. Where to find more information You can find more information about managing pain without opioids from: American Academy of Pain Medicine: painmed.[place]  for Chronic Pain: instituteforchronicpain.org American Chronic Pain Association: theacpa.org Contact a health care provider if: You have side effects from pain medicine. Your pain gets worse or does not get better with treatments or home therapy. You are struggling with anxiety or depression. Summary Many types of pain can be managed without opioids. Chronic pain may respond better to pain management without opioids. Pain is best managed when you and a team of health care providers work together. Pain management without opioids may include non-opioid  medicines, medical treatments, physical therapy, mental health therapy, and lifestyle changes. Tell your health care providers if your pain gets worse or is not being managed well enough. This information is not intended to replace advice given to you by your health care provider. Make sure you discuss any questions you have with your health care provider. Document Revised: 05/04/2020 Document Reviewed: 05/04/2020 Elsevier Patient Education  [place] 65 Years and Older, Female Preventive care refers to lifestyle choices and visits with your health care provider that can promote health and wellness. What does preventive care include? A yearly physical exam. This is also called an annual well check. Dental exams once or twice a year. Routine eye exams. Ask your health care provider how often you should have your eyes checked. Personal lifestyle choices, including: Daily care of your teeth and gums. Regular physical activity. Eating a healthy diet. Avoiding tobacco and drug use. Limiting alcohol use. Practicing safe sex. Taking low-dose aspirin every day. Taking vitamin and mineral supplements as recommended by your health care provider. What happens during an annual well check? The services and screenings done by your health care provider during your annual well check will depend on your age, overall health, lifestyle risk factors, and family history of disease. Counseling  Your health care provider may ask you questions about your: Alcohol use. Tobacco use. Drug use. Emotional well-being. Home and relationship well-being. Sexual activity. Eating habits. History of falls. Memory and ability to understand (cognition). Work and work Statistician. Reproductive health. Screening  You may have the following tests or measurements: Height, weight, and BMI. Blood pressure. Lipid and cholesterol levels. These may be checked every 5 years, or more frequently  if you are over 33 years old. Skin check. Lung cancer screening. You may have this screening every year starting at age 35 if you have a 30-pack-year history of smoking and currently smoke or have quit within the past 15 years. Fecal occult blood test (FOBT) of the stool. You may have this test every year starting at age 49. Flexible sigmoidoscopy or colonoscopy. You may have a sigmoidoscopy every 5 years or a colonoscopy every 10 years starting at age 50. Hepatitis C blood test. Hepatitis B blood test. Sexually transmitted disease (STD) testing. Diabetes screening. This is done by checking your blood sugar (glucose) after you have not eaten for a while (fasting). You may have this done every 1-3 years. Bone density scan. This is done to screen for osteoporosis. You may have this done starting at age 30. Mammogram. This may be done every 1-2 years. Talk to your health care provider about how often you should have regular mammograms. Talk with your health care provider about your test results, treatment options, and if necessary, the need for more tests. Vaccines  Your health care provider may recommend certain vaccines, such as: Influenza vaccine. This is recommended every year. Tetanus, diphtheria, and acellular pertussis (Tdap, Td) vaccine. You may need a Td booster every  10 years. Zoster vaccine. You may need this after age 66. Pneumococcal 13-valent conjugate (PCV13) vaccine. One dose is recommended after age 60. Pneumococcal polysaccharide (PPSV23) vaccine. One dose is recommended after age 15. Talk to your health care provider about which screenings and vaccines you need and how often you need them. This information is not intended to replace advice given to you by your health care provider. Make sure you discuss any questions you have with your health care provider. Document Released: 02/18/2015 Document Revised: 10/12/2015 Document Reviewed: 11/23/2014 Elsevier Interactive Patient  Education  2017 [place] Prevention in the Home Falls can cause injuries. They can happen to people of all ages. There are many things you can do to make your home safe and to help prevent falls. What can I do on the outside of my home? Regularly fix the edges of walkways and driveways and fix any cracks. Remove anything that might make you trip as you walk through a door, such as a raised step or threshold. Trim any bushes or trees on the path to your home. Use bright outdoor lighting. Clear any walking paths of anything that might make someone trip, such as rocks or tools. Regularly check to see if handrails are loose or broken. Make sure that both sides of any steps have handrails. Any raised decks and porches should have guardrails on the edges. Have any leaves, snow, or ice cleared regularly. Use sand or salt on walking paths during winter. Clean up any spills in your garage right away. This includes oil or grease spills. What can I do in the bathroom? Use night lights. Install grab bars by the toilet and in the tub and shower. Do not use towel bars as grab bars. Use non-skid mats or decals in the tub or shower. If you need to sit down in the shower, use a plastic, non-slip stool. Keep the floor dry. Clean up any water that spills on the floor as soon as it happens. Remove soap buildup in the tub or shower regularly. Attach bath mats securely with double-sided non-slip rug tape. Do not have throw rugs and other things on the floor that can make you trip. What can I do in the bedroom? Use night lights. Make sure that you have a light by your bed that is easy to reach. Do not use any sheets or blankets that are too big for your bed. They should not hang down onto the floor. Have a firm chair that has side arms. You can use this for support while you get dressed. Do not have throw rugs and other things on the floor that can make you trip. What can I do in the  kitchen? Clean up any spills right away. Avoid walking on wet floors. Keep items that you use a lot in easy-to-reach places. If you need to reach something above you, use a strong step stool that has a grab bar. Keep electrical cords out of the way. Do not use floor polish or wax that makes floors slippery. If you must use wax, use non-skid floor wax. Do not have throw rugs and other things on the floor that can make you trip. What can I do with my stairs? Do not leave any items on the stairs. Make sure that there are handrails on both sides of the stairs and use them. Fix handrails that are broken or loose. Make sure that handrails are as long as the stairways. Check any carpeting to make  sure that it is firmly attached to the stairs. Fix any carpet that is loose or worn. Avoid having throw rugs at the top or bottom of the stairs. If you do have throw rugs, attach them to the floor with carpet tape. Make sure that you have a light switch at the top of the stairs and the bottom of the stairs. If you do not have them, ask someone to add them for you. What else can I do to help prevent falls? Wear shoes that: Do not have high heels. Have rubber bottoms. Are comfortable and fit you well. Are closed at the toe. Do not wear sandals. If you use a stepladder: Make sure that it is fully opened. Do not climb a closed stepladder. Make sure that both sides of the stepladder are locked into place. Ask someone to hold it for you, if possible. Clearly mark and make sure that you can see: Any grab bars or handrails. First and last steps. Where the edge of each step is. Use tools that help you move around (mobility aids) if they are needed. These include: Canes. Walkers. Scooters. Crutches. Turn on the lights when you go into a dark area. Replace any light bulbs as soon as they burn out. Set up your furniture so you have a clear path. Avoid moving your furniture around. If any of your floors are  uneven, fix them. If there are any pets around you, be aware of where they are. Review your medicines with your doctor. Some medicines can make you feel dizzy. This can increase your chance of falling. Ask your doctor what other things that you can do to help prevent falls. This information is not intended to replace advice given to you by your health care provider. Make sure you discuss any questions you have with your health care provider. Document Released: 11/18/2008 Document Revised: 06/30/2015 Document Reviewed: 02/26/2014 Elsevier Interactive Patient Education  2017 [employer].

## 2021-11-03 NOTE — Progress Notes (Signed)
Subjective:   Betty Green is a 76 y.o. female who presents for Medicare Annual (Subsequent) preventive examination.  I connected with  Viana R Palardy on 11/03/21 by a audio enabled telemedicine application and verified that I am speaking with the correct person using two identifiers.  Patient Location: Home  Provider Location: Home Office  I discussed the limitations of evaluation and management by telemedicine. The patient expressed understanding and agreed to proceed.   Review of Systems     Cardiac Risk Factors include: advanced age (>47mn, >>49women);sedentary lifestyle;obesity (BMI >30kg/m2);hypertension;dyslipidemia;Other (see comment), Risk factor comments: OSA on CPAP     Objective:    Today's Vitals   11/03/21 1050  Weight: 277 lb (125.6 kg)  PainSc: 10-Worst pain ever   Body mass index is 54.1 kg/m.     11/03/2021   10:59 AM 06/23/2014    4:10 PM 06/22/2014    2:22 PM 01/28/2014    6:19 PM  Advanced Directives  Does Patient Have a Medical Advance Directive? No No No No  Would patient like information on creating a medical advance directive? No - Patient declined No - patient declined information No - patient declined information No - patient declined information    Current Medications (verified) Outpatient Encounter Medications as of 11/03/2021  Medication Sig   acetaminophen (TYLENOL) 325 MG tablet Take 2 tablets (650 mg total) by mouth every 6 (six) hours as needed for moderate pain.   azaTHIOprine (IMURAN) 50 MG tablet SMARTSIG:2 Tablet(s) By Mouth Morning-Night   diclofenac (VOLTAREN) 75 MG EC tablet Take 75 mg by mouth 2 (two) times daily.   furosemide (LASIX) 80 MG tablet 1 by mouth twice daily as needed for swelling   hydrOXYzine (ATARAX) 25 MG tablet TAKE 1-2 TABS BY MOUTH THREE TIMES PER DAY AS NEEDED FOR ITCHING   Semaglutide,0.25 or 0.'5MG'$ /DOS, (OZEMPIC, 0.25 OR 0.5 MG/DOSE,) 2 MG/1.5ML SOPN Inject 0.5 mg into the skin once a week.   traMADol  (ULTRAM) 50 MG tablet TAKE 1 TABLET (50 MG TOTAL) BY MOUTH 4 (FOUR) TIMES DAILY AS NEEDED.   albuterol (VENTOLIN HFA) 108 (90 Base) MCG/ACT inhaler INHALE 1-2 PUFFS BY MOUTH EVERY 6 HOURS AS NEEDED FOR WHEEZE OR SHORTNESS OF BREATH (Patient not taking: Reported on 11/03/2021)   [DISCONTINUED] fluticasone furoate-vilanterol (BREO ELLIPTA) 100-25 MCG/ACT AEPB Inhale 1 puff into the lungs daily. (Patient not taking: Reported on 11/03/2021)   [DISCONTINUED] liraglutide (VICTOZA) 18 MG/3ML SOPN Inject 1.8 mg into the skin daily. (Patient not taking: Reported on 11/03/2021)   [DISCONTINUED] ondansetron (ZOFRAN-ODT) 8 MG disintegrating tablet Take 1 tablet (8 mg total) by mouth every 8 (eight) hours as needed for nausea or vomiting. (Patient not taking: Reported on 11/03/2021)   No facility-administered encounter medications on file as of 11/03/2021.    Allergies (verified) Amoxicillin-pot clavulanate, Azithromycin, Celebrex [celecoxib], and Lipitor [atorvastatin]   History: Past Medical History:  Diagnosis Date   Allergy    Anxiety    "take RX prn"   Arthritis    "all over"   Asthma    Chronic bronchitis (HCC)    GERD (gastroesophageal reflux disease)    Hypertension    Insomnia 05/31/2011   OSA on CPAP    wears CPAP sometimes (06/23/2014)   Other and unspecified hyperlipidemia 08/04/2012   PONV (postoperative nausea and vomiting)    Shortness of breath dyspnea    with exertion   Shoulder pain, bilateral 10/18/2010   Vitamin D deficiency 12/21/2015   Past  Surgical History:  Procedure Laterality Date   APPENDECTOMY  1966   BREAST EXCISIONAL BIOPSY Right    CHOLECYSTECTOMY OPEN  1981   FRACTURE SURGERY     ORIF ANKLE FRACTURE Right 06/23/2014   ORIF ANKLE FRACTURE Right 06/23/2014   Procedure: Take Down Malunion Right Ankle, Open Reduction Internal Fixation Right Ankle, Possible Syndesmosis Repair, Medial Joint Debridement;  Surgeon: Newt Minion, MD;  Location: [facility];  Service: Orthopedics;   Laterality: Right;   TONSILLECTOMY  1963   TOTAL ABDOMINAL HYSTERECTOMY  1980   w/BSO   Family History  Problem Relation Age of Onset   Arthritis Other    Arthritis Mother    Heart disease Mother    Hypertension Mother    Arthritis Father    Heart disease Father    Hypertension Father    Cancer Other        colon and prostate cancer   Social History   Socioeconomic History   Marital status: Single    Spouse name: Not on file   Number of children: 2   Years of education: 80   Highest education level: Not on file  Occupational History   Occupation: retired    Fish farm manager: [employer]  Tobacco Use   Smoking status: Former    Packs/day: 0.10    Years: 2.00    Total pack years: 0.20    Types: Cigarettes    Quit date: 02/06/1984    Years since quitting: 37.7   Smokeless tobacco: Never  Substance and Sexual Activity   Alcohol use: No   Drug use: No   Sexual activity: Not Currently  Other Topics Concern   Not on file  Social History Narrative   Not on file   Social Determinants of Health   Financial Resource Strain: Low Risk  (11/03/2021)   Overall Financial Resource Strain (CARDIA)    Difficulty of Paying Living Expenses: Not hard at all  Food Insecurity: No Food Insecurity (11/03/2021)   Hunger Vital Sign    Worried About Running Out of Food in the Last Year: Never true    [place] in the Last Year: Never true  Transportation Needs: No Transportation Needs (11/03/2021)   PRAPARE - Hydrologist (Medical): No    Lack of Transportation (Non-Medical): No  Physical Activity: Inactive (11/03/2021)   Exercise Vital Sign    Days of Exercise per Week: 0 days    Minutes of Exercise per Session: 0 min  Stress: No Stress Concern Present (11/03/2021)   Star    Feeling of Stress : Not at all  Social Connections: Socially Isolated (11/03/2021)   Social Connection and  Isolation Panel [NHANES]    Frequency of Communication with Friends and Family: More than three times a week    Frequency of Social Gatherings with Friends and Family: More than three times a week    Attends Religious Services: Never    Marine scientist or Organizations: No    Attends Music therapist: Never    Marital Status: Never married    Tobacco Counseling Counseling given: Not Answered   Clinical Intake:  Pre-visit preparation completed: Yes  Pain : 0-10 Pain Score: 10-Worst pain ever Pain Type: Chronic pain Pain Location: Knee Pain Orientation: Left, Right Pain Descriptors / Indicators: Aching, Sharp Pain Onset: More than a month ago Pain Frequency: Intermittent     BMI -  recorded: 55.07 Nutritional Status: BMI > 30  Obese Nutritional Risks: None Diabetes: No  How often do you need to have someone help you when you read instructions, pamphlets, or other written materials from your doctor or pharmacy?: 1 - Never  Diabetic? no  Interpreter Needed?: No  Information entered by :: Laryssa Hassing, LPN   Activities of Daily Living    11/03/2021   10:59 AM 01/10/2021    2:41 PM  In your present state of health, do you have any difficulty performing the following activities:  Hearing? 0 0  Vision? 0 1  Difficulty concentrating or making decisions? 0 0  Walking or climbing stairs? 1 1  Dressing or bathing? 1 0  Comment cannot get in and out of shower - takes sink baths   Doing errands, shopping? 1 0  Comment she drives, but cannot go shopping due to knee pain - sister runs her errands for her   Preparing Food and eating ? N   Using the Toilet? N   In the past six months, have you accidently leaked urine? N   Do you have problems with loss of bowel control? N   Managing your Medications? N   Managing your Finances? N   Housekeeping or managing your Housekeeping? Y   Comment difficult due to knee pain     Patient Care Team: Biagio Borg,  MD as PCP - General (Internal Medicine) Rigoberto Noel, MD as Consulting Physician (Pulmonary Disease) Jola Schmidt, MD as Referring Physician (Ophthalmology)  Indicate any recent Medical Services you may have received from other than [facility] providers in the past year (date may be approximate).     Assessment:   This is a routine wellness examination for Ilda.  Hearing/Vision screen Hearing Screening - Comments:: Denies hearing difficulties   Vision Screening - Comments:: Wears rx glasses - up to date with routine eye exams with [facility]  Dietary issues and exercise activities discussed: Current Exercise Habits: The patient does not participate in regular exercise at present, Exercise limited by: orthopedic condition(s);respiratory conditions(s)   Goals Addressed             This Visit's Progress    Patient Stated       Hopes to lose weight so she can get knees operated on and get more active       Depression Screen    11/03/2021   10:58 AM 06/06/2020    3:04 PM 06/06/2020    2:17 PM 11/18/2019   10:47 AM 06/25/2019    4:10 PM 09/10/2018    8:20 AM 12/14/2015    8:56 AM  PHQ 2/9 Scores  PHQ - 2 Score 0 0 0 0 0 2 1  PHQ- 9 Score     0      Fall Risk    11/03/2021   10:52 AM 06/06/2020    3:04 PM 06/06/2020    2:17 PM 11/18/2019   10:47 AM 06/25/2019    4:10 PM  Fall Risk   Falls in the past year? 0 0 0 0 0  Number falls in past yr: 0  0 0 0  Injury with Fall? 0  0 0 0  Risk for fall due to : Orthopedic patient;Impaired balance/gait   No Fall Risks   Follow up Falls prevention discussed;Education provided   Falls evaluation completed     FALL RISK PREVENTION PERTAINING TO THE HOME:  Any stairs in or around the home? No  If so,  are there any without handrails? No  Home free of loose throw rugs in walkways, pet beds, electrical cords, etc? Yes  Adequate lighting in your home to reduce risk of falls? Yes   ASSISTIVE DEVICES UTILIZED TO PREVENT FALLS:  Life alert? No  Use of  a cane, walker or w/c? Yes  Grab bars in the bathroom? No  Shower chair or bench in shower? No  Elevated toilet seat or a handicapped toilet? No   TIMED UP AND GO:  Was the test performed? No . Telephonic visit  Cognitive Function:        11/03/2021   11:03 AM  6CIT Screen  What Year? 0 points  What month? 0 points  What time? 0 points  Count back from 20 0 points  Months in reverse 0 points  Repeat phrase 4 points  Total Score 4 points    Immunizations Immunization History  Administered Date(s) Administered   DTaP 10/06/1997   Fluad Quad(high Dose 65+) 11/18/2019, 11/08/2020   Influenza Split 10/18/2010, 10/30/2011   Influenza, High Dose Seasonal PF 11/14/2016   Influenza,inj,Quad PF,6+ Mos 10/20/2012, 03/24/2014, 12/14/2015   PFIZER Comirnaty(Gray Top)Covid-19 Tri-Sucrose Vaccine 03/28/2020   PFIZER(Purple Top)SARS-COV-2 Vaccination 04/22/2019, 05/13/2019   Pneumococcal Conjugate-13 07/16/2013   Pneumococcal Polysaccharide-23 08/24/2014   Tdap 10/18/2010    TDAP status: Due, Education has been provided regarding the importance of this vaccine. Advised may receive this vaccine at local pharmacy or Health Dept. Aware to provide a copy of the vaccination record if obtained from local pharmacy or Health Dept. Verbalized acceptance and understanding.  Flu Vaccine status: Due, Education has been provided regarding the importance of this vaccine. Advised may receive this vaccine at local pharmacy or Health Dept. Aware to provide a copy of the vaccination record if obtained from local pharmacy or Health Dept. Verbalized acceptance and understanding.  Pneumococcal vaccine status: Up to date  Covid-19 vaccine status: Information provided on how to obtain vaccines.   Qualifies for Shingles Vaccine? Yes   Zostavax completed No   Shingrix Completed?: No.    Education has been provided regarding the importance of this vaccine. Patient has been advised to call insurance company to  determine out of pocket expense if they have not yet received this vaccine. Advised may also receive vaccine at local pharmacy or Health Dept. Verbalized acceptance and understanding.  Screening Tests Health Maintenance  Topic Date Due   Zoster Vaccines- Shingrix (1 of 2) Never done   DEXA SCAN  02/08/2019   COVID-19 Vaccine (4 - Pfizer risk series) 05/23/2020   MAMMOGRAM  06/18/2020   TETANUS/TDAP  10/17/2020   INFLUENZA VACCINE  09/05/2021   Pneumonia Vaccine 22+ Years old  Completed   Hepatitis C Screening  Completed   HPV VACCINES  Aged Out   Fecal DNA (Cologuard)  Discontinued    Health Maintenance  Health Maintenance Due  Topic Date Due   Zoster Vaccines- Shingrix (1 of 2) Never done   DEXA SCAN  02/08/2019   COVID-19 Vaccine (4 - Pfizer risk series) 05/23/2020   MAMMOGRAM  06/18/2020   TETANUS/TDAP  10/17/2020   INFLUENZA VACCINE  09/05/2021    Colorectal cancer screening: No longer required.   Mammogram status: Ordered 11/03/2021. Pt provided with contact info and advised to call to schedule appt.   Bone Density status: Ordered 11/03/2021. Pt provided with contact info and advised to call to schedule appt.  Lung Cancer Screening: (Low Dose CT Chest recommended if Age 75-80 years, 37  pack-year currently smoking OR have quit w/in 15years.) does not qualify.   Additional Screening:  Hepatitis C Screening: does qualify; Completed 01/10/2021  Vision Screening: Recommended annual ophthalmology exams for early detection of glaucoma and other disorders of the eye. Is the patient up to date with their annual eye exam?  Yes  Who is the provider or what is the name of the office in which the patient attends annual eye exams? [facility] If pt is not established with a provider, would they like to be referred to a provider to establish care? No .   Dental Screening: Recommended annual dental exams for proper oral hygiene  Community Resource Referral / Chronic Care Management: CRR  required this visit?  No   CCM required this visit?  No      Plan:     I have personally reviewed and noted the following in the patient's chart:   Medical and social history Use of alcohol, tobacco or illicit drugs  Current medications and supplements including opioid prescriptions. Patient is currently taking opioid prescriptions. Information provided to patient regarding non-opioid alternatives. Patient advised to discuss non-opioid treatment plan with their provider. Functional ability and status Nutritional status Physical activity Advanced directives List of other physicians Hospitalizations, surgeries, and ER visits in previous 12 months Vitals Screenings to include cognitive, depression, and falls Referrals and appointments  In addition, I have reviewed and discussed with patient certain preventive protocols, quality metrics, and best practice recommendations. A written personalized care plan for preventive services as well as general preventive health recommendations were provided to patient.     Sandrea Hammond, LPN   10/07/1113   Nurse Notes: none

## 2021-11-09 ENCOUNTER — Other Ambulatory Visit: Payer: Self-pay | Admitting: Internal Medicine

## 2021-11-15 DIAGNOSIS — M170 Bilateral primary osteoarthritis of knee: Secondary | ICD-10-CM | POA: Diagnosis not present

## 2021-12-06 ENCOUNTER — Encounter: Payer: Self-pay | Admitting: Internal Medicine

## 2021-12-07 ENCOUNTER — Encounter: Payer: Self-pay | Admitting: Internal Medicine

## 2021-12-07 ENCOUNTER — Ambulatory Visit (INDEPENDENT_AMBULATORY_CARE_PROVIDER_SITE_OTHER): Payer: Medicare PPO | Admitting: Internal Medicine

## 2021-12-07 VITALS — BP 132/80 | HR 111 | Temp 97.7°F | Ht 60.0 in | Wt 286.0 lb

## 2021-12-07 DIAGNOSIS — R0600 Dyspnea, unspecified: Secondary | ICD-10-CM | POA: Diagnosis not present

## 2021-12-07 DIAGNOSIS — R609 Edema, unspecified: Secondary | ICD-10-CM

## 2021-12-07 DIAGNOSIS — R739 Hyperglycemia, unspecified: Secondary | ICD-10-CM | POA: Diagnosis not present

## 2021-12-07 DIAGNOSIS — L304 Erythema intertrigo: Secondary | ICD-10-CM | POA: Diagnosis not present

## 2021-12-07 DIAGNOSIS — E538 Deficiency of other specified B group vitamins: Secondary | ICD-10-CM | POA: Diagnosis not present

## 2021-12-07 DIAGNOSIS — E7849 Other hyperlipidemia: Secondary | ICD-10-CM | POA: Diagnosis not present

## 2021-12-07 DIAGNOSIS — I10 Essential (primary) hypertension: Secondary | ICD-10-CM | POA: Diagnosis not present

## 2021-12-07 DIAGNOSIS — Z0001 Encounter for general adult medical examination with abnormal findings: Secondary | ICD-10-CM

## 2021-12-07 DIAGNOSIS — E559 Vitamin D deficiency, unspecified: Secondary | ICD-10-CM | POA: Diagnosis not present

## 2021-12-07 DIAGNOSIS — R600 Localized edema: Secondary | ICD-10-CM

## 2021-12-07 LAB — BASIC METABOLIC PANEL
BUN: 14 mg/dL (ref 6–23)
CO2: 31 mEq/L (ref 19–32)
Calcium: 10 mg/dL (ref 8.4–10.5)
Chloride: 104 mEq/L (ref 96–112)
Creatinine, Ser: 0.96 mg/dL (ref 0.40–1.20)
GFR: 57.07 mL/min — ABNORMAL LOW (ref >60.00)
Glucose, Bld: 115 mg/dL — ABNORMAL HIGH (ref 70–99)
Potassium: 3.5 mEq/L (ref 3.5–5.1)
Sodium: 141 mEq/L (ref 135–145)

## 2021-12-07 LAB — CBC WITH DIFFERENTIAL/PLATELET
Basophils Absolute: 0 10*3/uL (ref 0.0–0.1)
Basophils Relative: 0.4 % (ref 0.0–3.0)
Eosinophils Absolute: 0.2 10*3/uL (ref 0.0–0.7)
Eosinophils Relative: 3.2 % (ref 0.0–5.0)
HCT: 38.6 % (ref 36.0–46.0)
Hemoglobin: 12.5 g/dL (ref 12.0–15.0)
Lymphocytes Relative: 25.7 % (ref 12.0–46.0)
Lymphs Abs: 1.7 10*3/uL (ref 0.7–4.0)
MCHC: 32.5 g/dL (ref 30.0–36.0)
MCV: 89.7 fl (ref 78.0–100.0)
Monocytes Absolute: 0.5 10*3/uL (ref 0.1–1.0)
Monocytes Relative: 7 % (ref 3.0–12.0)
Neutro Abs: 4.2 10*3/uL (ref 1.4–7.7)
Neutrophils Relative %: 63.7 % (ref 43.0–77.0)
Platelets: 277 10*3/uL (ref 150.0–400.0)
RBC: 4.3 Mil/uL (ref 3.87–5.11)
RDW: 15.2 % (ref 11.5–15.5)
WBC: 6.5 10*3/uL (ref 4.0–10.5)

## 2021-12-07 LAB — HEPATIC FUNCTION PANEL
ALT: 31 U/L (ref 0–35)
AST: 21 U/L (ref 0–37)
Albumin: 3.6 g/dL (ref 3.5–5.2)
Alkaline Phosphatase: 85 U/L (ref 39–117)
Bilirubin, Direct: 0.1 mg/dL (ref 0.0–0.3)
Total Bilirubin: 0.3 mg/dL (ref 0.2–1.2)
Total Protein: 7.3 g/dL (ref 6.0–8.3)

## 2021-12-07 LAB — LIPID PANEL
Cholesterol: 193 mg/dL (ref 0–200)
HDL: 50.7 mg/dL (ref >39.00)
LDL Cholesterol: 112 mg/dL — ABNORMAL HIGH (ref 0–99)
NonHDL: 142.15
Total CHOL/HDL Ratio: 4
Triglycerides: 152 mg/dL — ABNORMAL HIGH (ref 0.0–149.0)
VLDL: 30.4 mg/dL (ref 0.0–40.0)

## 2021-12-07 LAB — TSH: TSH: 1.08 u[IU]/mL (ref 0.35–5.50)

## 2021-12-07 LAB — VITAMIN D 25 HYDROXY (VIT D DEFICIENCY, FRACTURES): VITD: 15.96 ng/mL — ABNORMAL LOW (ref 30.00–100.00)

## 2021-12-07 LAB — VITAMIN B12: Vitamin B-12: 633 pg/mL (ref 211–911)

## 2021-12-07 LAB — HEMOGLOBIN A1C: Hgb A1c MFr Bld: 5.9 % (ref 4.6–6.5)

## 2021-12-07 MED ORDER — WEGOVY 0.25 MG/0.5ML ~~LOC~~ SOAJ: 0.2500 mg | SUBCUTANEOUS | 3 refills | Status: DC

## 2021-12-07 MED ORDER — ALBUTEROL SULFATE HFA 108 (90 BASE) MCG/ACT IN AERS: 2.0000 | INHALATION_SPRAY | Freq: Four times a day (QID) | RESPIRATORY_TRACT | 5 refills | Status: DC | PRN

## 2021-12-07 MED ORDER — KETOCONAZOLE 2 % EX CREA
1.0000 | TOPICAL_CREAM | Freq: Every day | CUTANEOUS | 2 refills | Status: AC
Start: 2021-12-07 — End: ?

## 2021-12-07 NOTE — Assessment & Plan Note (Signed)
Lab Results  Component Value Date   LDLCALC 112 (H) 12/07/2021   Stable, pt to continue current low chol diet, declines statin

## 2021-12-07 NOTE — Telephone Encounter (Signed)
Per patient Mcarthur Rossetti will cover [facility]

## 2021-12-07 NOTE — Assessment & Plan Note (Signed)
Age and sex appropriate education and counseling updated with regular exercise and diet Referrals for preventative services - for dxa and mamogram mar 2024 Immunizations addressed - for shingrix at pharmacy, flu shot today, declines tdap and covid booster Smoking counseling  - none needed Evidence for depression or other mood disorder - none significant Most recent labs reviewed. I have personally reviewed and have noted: 1) the patient's medical and social history 2) The patient's current medications and supplements 3) The patient's height, weight, and BMI have been recorded in the chart

## 2021-12-07 NOTE — Assessment & Plan Note (Signed)
Ok for [employer] cr prn,  to f/u any worsening symptoms or concerns

## 2021-12-07 NOTE — Progress Notes (Signed)
Patient ID: Betty Green, female   DOB: [birth year], 76 y.o.   MRN: [id number]         Chief Complaint:: wellness exam and Foot Swelling (Pt stated--both feet are swollen, tingling, tightness feeling.1 year.)  , obesity and bilateral knee pain, intertrigo and mild doe       HPI:  Betty Green is a 76 y.o. female here for wellness exam; plans to have shingrix at pharmacy, for flu shot today, declines tdap and covid booster, o/w up to date;  has dxa and mammogram scheduled for mar 2024.                           Also Pt denies chest pain, increased sob, wheezing, orthopnea, PND, palpitations, dizziness or syncope but has persistent bilateral LE edema, and admits to not taking the lasix 80 mg bid for many months, as the lasix makes her get up to BR every 30 min or so after each and has chronic knee pain makes this very difficult and painful.  Can't have knee surguries as has been unable to lose wt.  Willing to take wegovy, asks for rx.  Also has smell and rash to right groin area for several weeks.   Has ongoing mild doe she believes related to obesity.     Wt Readings from Last 3 Encounters:  12/07/21 286 lb (129.7 kg)  11/03/21 277 lb (125.6 kg)  01/10/21 282 lb (127.9 kg)   BP Readings from Last 3 Encounters:  12/07/21 132/80  04/04/21 (!) 170/92  01/10/21 130/80   Immunization History  Administered Date(s) Administered   DTaP 10/06/1997   Fluad Quad(high Dose 65+) 11/18/2019, 11/08/2020   Influenza Split 10/18/2010, 10/30/2011   Influenza, High Dose Seasonal PF 11/14/2016   Influenza,inj,Quad PF,6+ Mos 10/20/2012, 03/24/2014, 12/14/2015   PFIZER Comirnaty(Gray Top)Covid-19 Tri-Sucrose Vaccine 03/28/2020   PFIZER(Purple Top)SARS-COV-2 Vaccination 04/22/2019, 05/13/2019   Pneumococcal Conjugate-13 07/16/2013   Pneumococcal Polysaccharide-23 08/24/2014   Tdap 10/18/2010   Health Maintenance Due  Topic Date Due   Zoster Vaccines- Shingrix (1 of 2) Never done   DEXA SCAN  02/08/2019    COVID-19 Vaccine (4 - Pfizer risk series) 05/23/2020   MAMMOGRAM  06/18/2020   TETANUS/TDAP  10/17/2020   INFLUENZA VACCINE  09/05/2021      Past Medical History:  Diagnosis Date   Allergy    Anxiety    "take RX prn"   Arthritis    "all over"   Asthma    Chronic bronchitis ([place])    GERD (gastroesophageal reflux disease)    Hypertension    Insomnia 05/31/2011   OSA on CPAP    wears CPAP sometimes (06/23/2014)   Other and unspecified hyperlipidemia 08/04/2012   PONV (postoperative nausea and vomiting)    Shortness of breath dyspnea    with exertion   Shoulder pain, bilateral 10/18/2010   Vitamin D deficiency 12/21/2015   Past Surgical History:  Procedure Laterality Date   APPENDECTOMY  1966   BREAST EXCISIONAL BIOPSY Right    CHOLECYSTECTOMY OPEN  1981   FRACTURE SURGERY     ORIF ANKLE FRACTURE Right 06/23/2014   ORIF ANKLE FRACTURE Right 06/23/2014   Procedure: Take Down Malunion Right Ankle, Open Reduction Internal Fixation Right Ankle, Possible Syndesmosis Repair, Medial Joint Debridement;  Surgeon: Newt Minion, MD;  Location: [facility];  Service: Orthopedics;  Laterality: Right;   [place]  w/BSO    reports that she quit smoking about 37 years ago. Her smoking use included cigarettes. She has a 0.20 pack-year smoking history. She has never used smokeless tobacco. She reports that she does not drink alcohol and does not use drugs. family history includes Arthritis in her father, mother, and another family member; Cancer in an other family member; Heart disease in her father and mother; Hypertension in her father and mother. Allergies  Allergen Reactions   Amoxicillin-Pot Clavulanate Hives    Hives   Azithromycin Other (See Comments)    abd pain   Celebrex [Celecoxib] Itching   Lipitor [Atorvastatin] Itching   Current Outpatient Medications on File Prior to Visit  Medication Sig Dispense Refill   acetaminophen (TYLENOL)  325 MG tablet Take 2 tablets (650 mg total) by mouth every 6 (six) hours as needed for moderate pain. 30 tablet 0   diclofenac (VOLTAREN) 75 MG EC tablet Take 75 mg by mouth 2 (two) times daily.     furosemide (LASIX) 80 MG tablet 1 by mouth twice daily as needed for swelling 180 tablet 3   hydrOXYzine (ATARAX) 25 MG tablet TAKE 1-2 TABS BY MOUTH THREE TIMES PER DAY AS NEEDED FOR ITCHING 540 tablet 1   Semaglutide,0.25 or 0.'5MG'$ /DOS, (OZEMPIC, 0.25 OR 0.5 MG/DOSE,) 2 MG/1.5ML SOPN Inject 0.5 mg into the skin once a week. 6 mL 3   Semaglutide-Weight Management (WEGOVY) 0.25 MG/0.5ML SOAJ Inject 0.25 mg into the skin once a week. 6 mL 3   traMADol (ULTRAM) 50 MG tablet TAKE 1 TABLET (50 MG TOTAL) BY MOUTH 4 (FOUR) TIMES DAILY AS NEEDED. 120 tablet 2   No current facility-administered medications on file prior to visit.        ROS:  All others reviewed and negative.  Objective        PE:  BP 132/80   Pulse (!) 111   Temp 97.7 F (36.5 C)   Ht 5' (1.524 m)   Wt 286 lb (129.7 kg)   SpO2 96%   BMI 55.86 kg/m                 Constitutional: Pt appears in NAD               HENT: Head: NCAT.                Right Ear: External ear normal.                 Left Ear: External ear normal.                Eyes: . Pupils are equal, round, and reactive to light. Conjunctivae and EOM are normal               Nose: without d/c or deformity               Neck: Neck supple. Gross normal ROM               Cardiovascular: Normal rate and regular rhythm.                 Pulmonary/Chest: Effort normal and breath sounds without rales or wheezing.                Abd:  Soft, NT, ND, + BS, no organomegaly               Neurological: Pt is alert. At baseline orientation, motor grossly intact  Skin: Skin is warm. No rashes, no other new lesions, LE edema - 1-2+ bilat edema to knees               Psychiatric: Pt behavior is normal without agitation   Micro: none  Cardiac tracings I have personally  interpreted today:  none  Pertinent Radiological findings (summarize): none   Lab Results  Component Value Date   WBC 6.5 12/07/2021   HGB 12.5 12/07/2021   HCT 38.6 12/07/2021   PLT 277.0 12/07/2021   GLUCOSE 115 (H) 12/07/2021   CHOL 193 12/07/2021   TRIG 152.0 (H) 12/07/2021   HDL 50.70 12/07/2021   LDLDIRECT 134.1 05/02/2012   LDLCALC 112 (H) 12/07/2021   ALT 31 12/07/2021   AST 21 12/07/2021   NA 141 12/07/2021   K 3.5 12/07/2021   CL 104 12/07/2021   CREATININE 0.96 12/07/2021   BUN 14 12/07/2021   CO2 31 12/07/2021   TSH 1.08 12/07/2021   INR 1.05 06/22/2014   HGBA1C 5.9 12/07/2021   Assessment/Plan:  Betty Green is a 76 y.o. Black or African American [2] female with  has a past medical history of Allergy, Anxiety, Arthritis, Asthma, Chronic bronchitis ([place]), GERD (gastroesophageal reflux disease), Hypertension, Insomnia (05/31/2011), OSA on CPAP, Other and unspecified hyperlipidemia (08/04/2012), PONV (postoperative nausea and vomiting), Shortness of breath dyspnea, Shoulder pain, bilateral (10/18/2010), and Vitamin D deficiency (12/21/2015).  Encounter for well adult exam with abnormal findings Age and sex appropriate education and counseling updated with regular exercise and diet Referrals for preventative services - for dxa and mamogram mar 2024 Immunizations addressed - for shingrix at pharmacy, flu shot today, declines tdap and covid booster Smoking counseling  - none needed Evidence for depression or other mood disorder - none significant Most recent labs reviewed. I have personally reviewed and have noted: 1) the patient's medical and social history 2) The patient's current medications and supplements 3) The patient's height, weight, and BMI have been recorded in the chart   Vitamin D deficiency Last vitamin D Lab Results  Component Value Date   VD25OH 15.96 (L) 12/07/2021   Low, to start oral replacement   Peripheral edema With wt increased likely  fluid related encourage to take the lasix at least once per day  Hypertension BP Readings from Last 3 Encounters:  12/07/21 132/80  04/04/21 (!) 170/92  01/10/21 130/80   Stable, pt to continue medical treatment - low salt diet, exercise, wt control   Hyperglycemia Lab Results  Component Value Date   HGBA1C 5.9 12/07/2021   Stable, pt to continue current medical treatment  - wegovy 0.25 mg   HLD (hyperlipidemia) Lab Results  Component Value Date   LDLCALC 112 (H) 12/07/2021   Stable, pt to continue current low chol diet, declines statin   Dyspnea Ok for albuterol HFA prn trial,  Intertrigo Ok for [employer] cr prn,  to f/u any worsening symptoms or concerns Followup: Return in about 6 months (around 06/07/2022).  Cathlean Cower, MD 12/07/2021 7:35 PM [facility]

## 2021-12-07 NOTE — Assessment & Plan Note (Signed)
Lab Results  Component Value Date   HGBA1C 5.9 12/07/2021   Stable, pt to continue current medical treatment  - wegovy 0.25 mg

## 2021-12-07 NOTE — Patient Instructions (Signed)
You had the flu shot today  Please have your Shingrix (shingles) shots done at your local pharmacy.  Please take all new medication as prescribed - the cream, and the wegovy, and the inhaler as needed  Please continue all other medications as before, including the lasix at least once per day  Please have the pharmacy call with any other refills you may need.  Please continue your efforts at being more active, low cholesterol diet, and weight control.  You are otherwise up to date with prevention measures today.  Please keep your appointments with your specialists as you may have planned  Please go to the LAB at the blood drawing area for the tests to be done  You will be contacted by phone if any changes need to be made immediately.  Otherwise, you will receive a letter about your results with an explanation, but please check with MyChart first.  Please remember to sign up for MyChart if you have not done so, as this will be important to you in the future with finding out test results, communicating by private email, and scheduling acute appointments online when needed.  Please make an Appointment to return in 6 months, or sooner if needed

## 2021-12-07 NOTE — Assessment & Plan Note (Signed)
Last vitamin D Lab Results  Component Value Date   VD25OH 15.96 (L) 12/07/2021   Low, to start oral replacement

## 2021-12-07 NOTE — Assessment & Plan Note (Signed)
With wt increased likely fluid related encourage to take the lasix at least once per day

## 2021-12-07 NOTE — Telephone Encounter (Signed)
Ok to let pt know- the wegovy is approved for weight loss, but usually not covered by insurance.  Please have pt check first to see if covered    thanks

## 2021-12-07 NOTE — Assessment & Plan Note (Signed)
Ok for albuterol HFA prn trial,

## 2021-12-07 NOTE — Telephone Encounter (Signed)
Patient would like to switch to [facility] due to [place] causing stomach issues.

## 2021-12-07 NOTE — Assessment & Plan Note (Signed)
BP Readings from Last 3 Encounters:  12/07/21 132/80  04/04/21 (!) 170/92  01/10/21 130/80   Stable, pt to continue medical treatment - low salt diet, exercise, wt control

## 2021-12-08 LAB — URINALYSIS, ROUTINE W REFLEX MICROSCOPIC
Bilirubin Urine: NEGATIVE
Hgb urine dipstick: NEGATIVE
Ketones, ur: NEGATIVE
Leukocytes,Ua: NEGATIVE
Nitrite: NEGATIVE
Specific Gravity, Urine: 1.025 (ref 1.000–1.030)
Total Protein, Urine: NEGATIVE
Urine Glucose: NEGATIVE
Urobilinogen, UA: 0.2 (ref 0.0–1.0)
pH: 5.5 (ref 5.0–8.0)

## 2021-12-08 LAB — MICROALBUMIN / CREATININE URINE RATIO
Creatinine,U: 147.8 mg/dL
Microalb Creat Ratio: 0.6 mg/g (ref 0.0–30.0)
Microalb, Ur: 0.9 mg/dL (ref 0.0–1.9)

## 2021-12-10 ENCOUNTER — Encounter: Payer: Self-pay | Admitting: Internal Medicine

## 2021-12-11 NOTE — Telephone Encounter (Signed)
Patient called concerning this rx

## 2021-12-13 NOTE — Telephone Encounter (Signed)
Unfortunately, there is no other alternative as Ozempic and Mounjaro are very expensive and only covered if she has DM

## 2021-12-14 NOTE — Telephone Encounter (Signed)
Made pt aware of Dr. Jenny Reichmann note about not being alternative of what pt is requesting

## 2022-01-02 ENCOUNTER — Encounter: Payer: Self-pay | Admitting: Internal Medicine

## 2022-01-04 NOTE — Telephone Encounter (Signed)
Patient is requesting Betty Green since she is unable to get ozempic.

## 2022-01-04 NOTE — Telephone Encounter (Signed)
Very sorry, but since she does not have full blown diabetes, this is not covered with her insurance.  I understand it is about $1200 per month, but she would most likely need to pay cash for this.  Let me know if she wants this

## 2022-01-05 NOTE — Telephone Encounter (Signed)
Patient states that she will wait until pharmacy gets Ozempic back in stock.

## 2022-01-13 ENCOUNTER — Encounter: Payer: Self-pay | Admitting: Internal Medicine

## 2022-01-15 NOTE — Telephone Encounter (Signed)
Please advise for Zepbound per patient for weight loss.

## 2022-01-15 NOTE — Telephone Encounter (Signed)
Per patient, Please disregard the request for Zepbound.

## 2022-01-25 ENCOUNTER — Encounter: Payer: Self-pay | Admitting: Internal Medicine

## 2022-03-07 DIAGNOSIS — M170 Bilateral primary osteoarthritis of knee: Secondary | ICD-10-CM | POA: Diagnosis not present

## 2022-03-14 DIAGNOSIS — M170 Bilateral primary osteoarthritis of knee: Secondary | ICD-10-CM | POA: Diagnosis not present

## 2022-03-21 DIAGNOSIS — M170 Bilateral primary osteoarthritis of knee: Secondary | ICD-10-CM | POA: Diagnosis not present

## 2022-03-22 ENCOUNTER — Ambulatory Visit (INDEPENDENT_AMBULATORY_CARE_PROVIDER_SITE_OTHER): Payer: Medicare HMO | Admitting: Internal Medicine

## 2022-03-22 VITALS — BP 130/78 | HR 101 | Temp 98.6°F | Ht 60.0 in | Wt 287.0 lb

## 2022-03-22 DIAGNOSIS — R609 Edema, unspecified: Secondary | ICD-10-CM

## 2022-03-22 DIAGNOSIS — E538 Deficiency of other specified B group vitamins: Secondary | ICD-10-CM | POA: Diagnosis not present

## 2022-03-22 DIAGNOSIS — R739 Hyperglycemia, unspecified: Secondary | ICD-10-CM

## 2022-03-22 DIAGNOSIS — E559 Vitamin D deficiency, unspecified: Secondary | ICD-10-CM | POA: Diagnosis not present

## 2022-03-22 DIAGNOSIS — E7849 Other hyperlipidemia: Secondary | ICD-10-CM

## 2022-03-22 LAB — CBC WITH DIFFERENTIAL/PLATELET
Basophils Absolute: 0 10*3/uL (ref 0.0–0.1)
Basophils Relative: 0.3 % (ref 0.0–3.0)
Eosinophils Absolute: 0.3 10*3/uL (ref 0.0–0.7)
Eosinophils Relative: 4.5 % (ref 0.0–5.0)
HCT: 41.2 % (ref 36.0–46.0)
Hemoglobin: 13.4 g/dL (ref 12.0–15.0)
Lymphocytes Relative: 29.9 % (ref 12.0–46.0)
Lymphs Abs: 2.1 10*3/uL (ref 0.7–4.0)
MCHC: 32.5 g/dL (ref 30.0–36.0)
MCV: 89.3 fl (ref 78.0–100.0)
Monocytes Absolute: 0.6 10*3/uL (ref 0.1–1.0)
Monocytes Relative: 8.1 % (ref 3.0–12.0)
Neutro Abs: 3.9 10*3/uL (ref 1.4–7.7)
Neutrophils Relative %: 57.2 % (ref 43.0–77.0)
Platelets: 344 10*3/uL (ref 150.0–400.0)
RBC: 4.62 Mil/uL (ref 3.87–5.11)
RDW: 14.8 % (ref 11.5–15.5)
WBC: 6.9 10*3/uL (ref 4.0–10.5)

## 2022-03-22 LAB — HEMOGLOBIN A1C: Hgb A1c MFr Bld: 5.8 % (ref 4.6–6.5)

## 2022-03-22 LAB — VITAMIN B12: Vitamin B-12: 839 pg/mL (ref 211–911)

## 2022-03-22 LAB — TSH: TSH: 1.3 u[IU]/mL (ref 0.35–5.50)

## 2022-03-22 LAB — VITAMIN D 25 HYDROXY (VIT D DEFICIENCY, FRACTURES): VITD: 24.29 ng/mL — ABNORMAL LOW (ref 30.00–100.00)

## 2022-03-22 MED ORDER — SEMAGLUTIDE (1 MG/DOSE) 4 MG/3ML ~~LOC~~ SOPN: 1.0000 mg | PEN_INJECTOR | SUBCUTANEOUS | 3 refills | Status: DC

## 2022-03-22 MED ORDER — FUROSEMIDE 80 MG PO TABS: ORAL_TABLET | ORAL | 3 refills | Status: DC

## 2022-03-22 MED ORDER — POTASSIUM CHLORIDE ER 10 MEQ PO TBCR: 10.0000 meq | EXTENDED_RELEASE_TABLET | Freq: Every day | ORAL | 3 refills | Status: DC

## 2022-03-22 NOTE — Progress Notes (Signed)
Patient ID: Betty Green, female   DOB: 07/03/1945, 77 y.o.   MRN: [id number]        Chief Complaint: follow up HTN, HLD, DM, obesity, leg swelling       HPI:  Betty Green is a 77 y.o. female here with family, leg swelling worsening after lasix stoppepd, , hard to lose wt with diet, exercise, Pt denies chest pain, increased sob or doe, wheezing, orthopnea, PND, palpitations, dizziness or syncope.   Pt denies polydipsia, polyuria, or new focal neuro s/s.    Pt denies fever, wt loss, night sweats, loss of appetite, or other constitutional symptoms  Denies urinary symptoms such as dysuria, frequency, urgency, flank pain, hematuria or n/v, fever, chills.         Wt Readings from Last 3 Encounters:  03/22/22 287 lb (130.2 kg)  12/07/21 286 lb (129.7 kg)  11/03/21 277 lb (125.6 kg)   BP Readings from Last 3 Encounters:  03/22/22 130/78  12/07/21 132/80  04/04/21 (!) 170/92         Past Medical History:  Diagnosis Date   Allergy    Anxiety    "take RX prn"   Arthritis    "all over"   Asthma    Chronic bronchitis ([place])    GERD (gastroesophageal reflux disease)    Hypertension    Insomnia 05/31/2011   OSA on CPAP    wears CPAP sometimes (06/23/2014)   Other and unspecified hyperlipidemia 08/04/2012   PONV (postoperative nausea and vomiting)    Shortness of breath dyspnea    with exertion   Shoulder pain, bilateral 10/18/2010   Vitamin D deficiency 12/21/2015   Past Surgical History:  Procedure Laterality Date   APPENDECTOMY  1966   BREAST EXCISIONAL BIOPSY Right    CHOLECYSTECTOMY OPEN  1981   FRACTURE SURGERY     ORIF ANKLE FRACTURE Right 06/23/2014   ORIF ANKLE FRACTURE Right 06/23/2014   Procedure: Take Down Malunion Right Ankle, Open Reduction Internal Fixation Right Ankle, Possible Syndesmosis Repair, Medial Joint Debridement;  Surgeon: Newt Minion, MD;  Location: [facility];  Service: Orthopedics;  Laterality: Right;   [place]    w/BSO    reports that she quit smoking about 38 years ago. Her smoking use included cigarettes. She has a 0.20 pack-year smoking history. She has never used smokeless tobacco. She reports that she does not drink alcohol and does not use drugs. family history includes Arthritis in her father, mother, and another family member; Cancer in an other family member; Heart disease in her father and mother; Hypertension in her father and mother. Allergies  Allergen Reactions   Amoxicillin-Pot Clavulanate Hives    Hives   Azithromycin Other (See Comments)    abd pain   Celebrex [Celecoxib] Itching   Lipitor [Atorvastatin] Itching   Current Outpatient Medications on File Prior to Visit  Medication Sig Dispense Refill   acetaminophen (TYLENOL) 325 MG tablet Take 2 tablets (650 mg total) by mouth every 6 (six) hours as needed for moderate pain. 30 tablet 0   albuterol (VENTOLIN HFA) 108 (90 Base) MCG/ACT inhaler Inhale 2 puffs into the lungs every 6 (six) hours as needed for wheezing or shortness of breath. 8 g 5   diclofenac (VOLTAREN) 75 MG EC tablet Take 75 mg by mouth 2 (two) times daily.     hydrOXYzine (ATARAX) 25 MG tablet TAKE 1-2 TABS BY MOUTH THREE TIMES PER DAY  AS NEEDED FOR ITCHING 540 tablet 1   ketoconazole (NIZORAL) 2 % cream Apply 1 Application topically daily. 30 g 2   traMADol (ULTRAM) 50 MG tablet TAKE 1 TABLET (50 MG TOTAL) BY MOUTH 4 (FOUR) TIMES DAILY AS NEEDED. 120 tablet 2   No current facility-administered medications on file prior to visit.        ROS:  All others reviewed and negative.  Objective        PE:  BP 130/78 (BP Location: Right Arm, Patient Position: Sitting, Cuff Size: Large)   Pulse (!) 101   Temp 98.6 F (37 C) (Oral)   Ht 5' (1.524 m)   Wt 287 lb (130.2 kg)   SpO2 96%   BMI 56.05 kg/m                 Constitutional: Pt appears in NAD               HENT: Head: NCAT.                Right Ear: External ear normal.                 Left Ear: External  ear normal.                Eyes: . Pupils are equal, round, and reactive to light. Conjunctivae and EOM are normal               Nose: without d/c or deformity               Neck: Neck supple. Gross normal ROM               Cardiovascular: Normal rate and regular rhythm.                 Pulmonary/Chest: Effort normal and breath sounds without rales or wheezing.                Abd:  Soft, NT, ND, + BS, no organomegaly               Neurological: Pt is alert. At baseline orientation, motor grossly intact               Skin: Skin is warm. No rashes, no other new lesions, LE edema - 2+ edema to above the knees               Psychiatric: Pt behavior is normal without agitation   Micro: none  Cardiac tracings I have personally interpreted today:  none  Pertinent Radiological findings (summarize): none   Lab Results  Component Value Date   WBC 6.9 03/22/2022   HGB 13.4 03/22/2022   HCT 41.2 03/22/2022   PLT 344.0 03/22/2022   GLUCOSE 99 03/22/2022   CHOL 183 03/22/2022   TRIG 105.0 03/22/2022   HDL 54.70 03/22/2022   LDLDIRECT 134.1 05/02/2012   LDLCALC 107 (H) 03/22/2022   ALT 18 03/22/2022   AST 24 03/22/2022   NA 140 03/22/2022   K 4.4 03/22/2022   CL 102 03/22/2022   CREATININE 1.17 03/22/2022   BUN 16 03/22/2022   CO2 27 03/22/2022   TSH 1.30 03/22/2022   INR 1.05 06/22/2014   HGBA1C 5.8 03/22/2022   MICROALBUR 0.9 12/07/2021   Assessment/Plan:  Betty Green is a 77 y.o. Black or African American [2] female with  has a past medical history of Allergy, Anxiety, Arthritis, Asthma, Chronic bronchitis ([place]), GERD (gastroesophageal reflux  disease), Hypertension, Insomnia (05/31/2011), OSA on CPAP, Other and unspecified hyperlipidemia (08/04/2012), PONV (postoperative nausea and vomiting), Shortness of breath dyspnea, Shoulder pain, bilateral (10/18/2010), and Vitamin D deficiency (12/21/2015).  HLD (hyperlipidemia) Lab Results  Component Value Date   LDLCALC 107 (H) 03/22/2022    Uncontrolled, goal ld < 70, pt to continue zetia 10 mg, declines statin   Hyperglycemia Lab Results  Component Value Date   HGBA1C 5.8 03/22/2022   Stable, pt also for increased ozempic 1 mg weekly   Peripheral edema Pt to restart lasix 80 bid, also klor con 10 qd  Vitamin D deficiency Last vitamin D Lab Results  Component Value Date   VD25OH 24.29 (L) 03/22/2022   Low, to start oral replacement  Followup: Return in about 3 weeks (around 04/12/2022).  Cathlean Cower, MD 03/24/2022 7:57 PM [facility]

## 2022-03-22 NOTE — Patient Instructions (Addendum)
Ok to restart the lasix at 80 mg twice per day  Please take all new medication as prescribed - the potassium pill one per day  Ok to increase the ozempic to 1 mg weekly  Please continue all other medications as before, and refills have been done if requested.  Please have the pharmacy call with any other refills you may need.  Please continue your efforts at being more active, low cholesterol diet, and weight control.  Please keep your appointments with your specialists as you may have planned  Please go to the LAB at the blood drawing area for the tests to be done  You will be contacted by phone if any changes need to be made immediately.  Otherwise, you will receive a letter about your results with an explanation, but please check with MyChart first.  Please remember to sign up for MyChart if you have not done so, as this will be important to you in the future with finding out test results, communicating by private email, and scheduling acute appointments online when needed.  Please make an Appointment to return in 2 - 3 weeks,, or sooner if needed

## 2022-03-23 ENCOUNTER — Other Ambulatory Visit: Payer: Self-pay | Admitting: Internal Medicine

## 2022-03-23 ENCOUNTER — Telehealth: Payer: Self-pay

## 2022-03-23 LAB — BASIC METABOLIC PANEL
BUN: 16 mg/dL (ref 6–23)
CO2: 27 mEq/L (ref 19–32)
Calcium: 10.7 mg/dL — ABNORMAL HIGH (ref 8.4–10.5)
Chloride: 102 mEq/L (ref 96–112)
Creatinine, Ser: 1.17 mg/dL (ref 0.40–1.20)
GFR: 44.92 mL/min — ABNORMAL LOW (ref >60.00)
Glucose, Bld: 99 mg/dL (ref 70–99)
Potassium: 4.4 mEq/L (ref 3.5–5.1)
Sodium: 140 mEq/L (ref 135–145)

## 2022-03-23 LAB — HEPATIC FUNCTION PANEL
ALT: 18 U/L (ref 0–35)
AST: 24 U/L (ref 0–37)
Albumin: 3.8 g/dL (ref 3.5–5.2)
Alkaline Phosphatase: 82 U/L (ref 39–117)
Bilirubin, Direct: 0 mg/dL (ref 0.0–0.3)
Total Bilirubin: 0.3 mg/dL (ref 0.2–1.2)
Total Protein: 7.9 g/dL (ref 6.0–8.3)

## 2022-03-23 LAB — LIPID PANEL
Cholesterol: 183 mg/dL (ref 0–200)
HDL: 54.7 mg/dL (ref >39.00)
LDL Cholesterol: 107 mg/dL — ABNORMAL HIGH (ref 0–99)
NonHDL: 128.46
Total CHOL/HDL Ratio: 3
Triglycerides: 105 mg/dL (ref 0.0–149.0)
VLDL: 21 mg/dL (ref 0.0–40.0)

## 2022-03-23 MED ORDER — EZETIMIBE 10 MG PO TABS: 10.0000 mg | ORAL_TABLET | Freq: Every day | ORAL | 3 refills | Status: DC

## 2022-03-23 NOTE — Telephone Encounter (Signed)
Pt PA for Ozempic   Key: [id number]

## 2022-03-23 NOTE — Telephone Encounter (Signed)
Pharmacy Patient Advocate Encounter  Received notification from [place] that the request for prior authorization for Ozempic has been denied due to .    Please be advised we currently do not have a Pharmacist to review denials, therefore you will need to process appeals accordingly as needed. Thanks for your support at this time.   You may call [phone] or fax [phone], to appeal.

## 2022-03-24 ENCOUNTER — Encounter: Payer: Self-pay | Admitting: Internal Medicine

## 2022-03-24 NOTE — Assessment & Plan Note (Signed)
Last vitamin D Lab Results  Component Value Date   VD25OH 24.29 (L) 03/22/2022   Low, to start oral replacement

## 2022-03-24 NOTE — Assessment & Plan Note (Addendum)
Lab Results  Component Value Date   LDLCALC 107 (H) 03/22/2022   Uncontrolled, goal ld < 70, pt to continue zetia 10 mg, declines statin

## 2022-03-24 NOTE — Assessment & Plan Note (Signed)
Lab Results  Component Value Date   HGBA1C 5.8 03/22/2022   Stable, pt also for increased ozempic 1 mg weekly

## 2022-03-24 NOTE — Assessment & Plan Note (Signed)
Pt to restart lasix 80 bid, also klor con 10 qd

## 2022-03-26 ENCOUNTER — Encounter: Payer: Self-pay | Admitting: Internal Medicine

## 2022-03-26 ENCOUNTER — Telehealth: Payer: Medicare HMO | Admitting: Physician Assistant

## 2022-03-26 DIAGNOSIS — M7989 Other specified soft tissue disorders: Secondary | ICD-10-CM

## 2022-03-26 NOTE — Progress Notes (Signed)
Because this is not something we can properly assess or manage via e-visit , I feel your condition warrants further evaluation and I recommend that you be seen in a face to face visit.  I would recommend contacting your PCP or specialist for evaluation ASAP.    NOTE: There will be NO CHARGE for this eVisit   If you are having a true medical emergency please call 911.      For an urgent face to face visit, [facility] has eight urgent care centers for your convenience:   NEW!! [facility] [place] Get Driving Directions [id number] [address], [address] [place], [place] [place] Get Driving Directions [id number] [place], [place] [address]   [facility]) Get Driving Directions [id number] [address], [place] [address]  [facility] ([place]) Get Driving Directions [id number] [address] [place],  [place]  [address]  [facility] - at [address] Get Driving Directions  [id number] 580 [address] [place],  [place] [address]   [facility] Get Driving Directions [id number] [address] [place], [place], [place] [address]   [facility] Get Driving Directions  [id number] [address] [place], [place] [address]   [facility] [place] Get Driving Directions [id number] [address] [place],  [address]  Your MyChart E-visit questionnaire answers were reviewed by a board certified advanced clinical practitioner to complete your personal care plan based on your specific symptoms.  Thank you for using e-Visits.

## 2022-03-26 NOTE — Progress Notes (Signed)
I have spent 5 minutes in review of e-visit questionnaire, review and updating patient chart, medical decision making and response to patient.   Kaislee Chao Cody Joanathan Affeldt, PA-C    

## 2022-03-26 NOTE — Telephone Encounter (Signed)
Due to the high doses involved, we should see her back in the office, thanks

## 2022-03-26 NOTE — Telephone Encounter (Signed)
Called pt gave her MD response. Pt states she will hold off on making appt. If feet/ankle continue to swell will make appt.Marland KitchenJohny Chess

## 2022-03-27 ENCOUNTER — Other Ambulatory Visit (INDEPENDENT_AMBULATORY_CARE_PROVIDER_SITE_OTHER): Payer: Medicare HMO

## 2022-03-27 ENCOUNTER — Other Ambulatory Visit: Payer: Self-pay | Admitting: Internal Medicine

## 2022-03-27 DIAGNOSIS — R739 Hyperglycemia, unspecified: Secondary | ICD-10-CM

## 2022-03-27 DIAGNOSIS — E7849 Other hyperlipidemia: Secondary | ICD-10-CM

## 2022-03-27 LAB — MICROALBUMIN / CREATININE URINE RATIO
Creatinine,U: 145.6 mg/dL
Microalb Creat Ratio: 1.9 mg/g (ref 0.0–30.0)
Microalb, Ur: 2.8 mg/dL — ABNORMAL HIGH (ref 0.0–1.9)

## 2022-03-27 LAB — URINALYSIS, ROUTINE W REFLEX MICROSCOPIC
Bilirubin Urine: NEGATIVE
Hgb urine dipstick: NEGATIVE
Ketones, ur: NEGATIVE
Leukocytes,Ua: NEGATIVE
Nitrite: NEGATIVE
RBC / HPF: NONE SEEN (ref 0–?)
Specific Gravity, Urine: 1.025 (ref 1.000–1.030)
Total Protein, Urine: NEGATIVE
Urine Glucose: NEGATIVE
Urobilinogen, UA: 0.2 (ref 0.0–1.0)
pH: 6 (ref 5.0–8.0)

## 2022-03-27 MED ORDER — CIPROFLOXACIN HCL 500 MG PO TABS: 500.0000 mg | ORAL_TABLET | Freq: Two times a day (BID) | ORAL | 0 refills | Status: AC

## 2022-03-29 ENCOUNTER — Ambulatory Visit: Payer: Medicare PPO | Admitting: Internal Medicine

## 2022-04-02 ENCOUNTER — Telehealth: Payer: Self-pay

## 2022-04-02 NOTE — Telephone Encounter (Signed)
Pt PA for ozempic is send to plan  Key: [id number]

## 2022-04-09 ENCOUNTER — Encounter: Payer: Self-pay | Admitting: Internal Medicine

## 2022-04-09 MED ORDER — DICLOFENAC SODIUM 75 MG PO TBEC: 75.0000 mg | DELAYED_RELEASE_TABLET | Freq: Two times a day (BID) | ORAL | 1 refills | Status: DC

## 2022-04-09 NOTE — Telephone Encounter (Signed)
Do you recommend and ov before prescribing meds at pt request

## 2022-04-10 MED ORDER — CARBAMAZEPINE ER 100 MG PO TB12: 100.0000 mg | ORAL_TABLET | Freq: Two times a day (BID) | ORAL | 1 refills | Status: DC

## 2022-04-10 NOTE — Addendum Note (Signed)
Addended by: Biagio Borg on: 04/10/2022 09:40 AM   Modules accepted: Orders

## 2022-04-11 ENCOUNTER — Other Ambulatory Visit: Payer: Self-pay | Admitting: Internal Medicine

## 2022-04-13 ENCOUNTER — Ambulatory Visit: Payer: Medicare HMO | Admitting: Internal Medicine

## 2022-04-27 ENCOUNTER — Other Ambulatory Visit (HOSPITAL_COMMUNITY): Payer: Self-pay

## 2022-04-30 ENCOUNTER — Ambulatory Visit (INDEPENDENT_AMBULATORY_CARE_PROVIDER_SITE_OTHER): Payer: Medicare HMO | Admitting: Internal Medicine

## 2022-04-30 ENCOUNTER — Encounter: Payer: Self-pay | Admitting: Internal Medicine

## 2022-04-30 VITALS — BP 130/78 | HR 95 | Temp 97.6°F | Ht 60.0 in | Wt 287.0 lb

## 2022-04-30 DIAGNOSIS — L03115 Cellulitis of right lower limb: Secondary | ICD-10-CM | POA: Diagnosis not present

## 2022-04-30 DIAGNOSIS — I10 Essential (primary) hypertension: Secondary | ICD-10-CM

## 2022-04-30 DIAGNOSIS — Z0001 Encounter for general adult medical examination with abnormal findings: Secondary | ICD-10-CM | POA: Diagnosis not present

## 2022-04-30 DIAGNOSIS — R7303 Prediabetes: Secondary | ICD-10-CM

## 2022-04-30 MED ORDER — TIRZEPATIDE 2.5 MG/0.5ML ~~LOC~~ SOAJ: 2.5000 mg | SUBCUTANEOUS | 11 refills | Status: DC

## 2022-04-30 MED ORDER — LEVOFLOXACIN 500 MG PO TABS: 500.0000 mg | ORAL_TABLET | Freq: Every day | ORAL | 0 refills | Status: AC

## 2022-04-30 NOTE — Assessment & Plan Note (Signed)
BP Readings from Last 3 Encounters:  04/30/22 130/78  03/22/22 130/78  12/07/21 132/80   Stable, pt to continue medical treatment hct 25 qd

## 2022-04-30 NOTE — Progress Notes (Signed)
Patient ID: Betty Green, female   DOB: 1945/12/15, 77 y.o.   MRN: [id number]         Chief Complaint:: wellness exam and Bilateral swelling of feet  , preDM, right foot cellulitis, htn       HPI:  Betty Green is a 77 y.o. female here for wellness exam; scheduled soon for dxa, mammogram, declines tdap and covid booster, for shingrix at pharmacy, ow up to date                        Also Pt denies chest pain, increased sob or doe, wheezing, orthopnea, PND, increased LE swelling, palpitations, dizziness or syncope.   Pt denies polydipsia, polyuria, or new focal neuro s/s.    Pt denies fever, wt loss, night sweats, loss of appetite, or other constitutional symptoms  Does have right dorsal mid foot pain, redness, swelling for several days.  Overall swelling some improved since restarting lasix.  Has not been able to get the ozempic due to backorder.     Wt Readings from Last 3 Encounters:  04/30/22 287 lb (130.2 kg)  03/22/22 287 lb (130.2 kg)  12/07/21 286 lb (129.7 kg)   BP Readings from Last 3 Encounters:  04/30/22 130/78  03/22/22 130/78  12/07/21 132/80   Immunization History  Administered Date(s) Administered   DTaP 10/06/1997   Fluad Quad(high Dose 65+) 11/18/2019, 11/08/2020, 11/24/2021   Influenza Split 10/18/2010, 10/30/2011   Influenza, High Dose Seasonal PF 11/14/2016   Influenza,inj,Quad PF,6+ Mos 10/20/2012, 03/24/2014, 12/14/2015   PFIZER Comirnaty(Gray Top)Covid-19 Tri-Sucrose Vaccine 03/28/2020   PFIZER(Purple Top)SARS-COV-2 Vaccination 04/22/2019, 05/13/2019   Pneumococcal Conjugate-13 07/16/2013   Pneumococcal Polysaccharide-23 08/24/2014   Tdap 10/18/2010   Health Maintenance Due  Topic Date Due   DEXA SCAN  02/08/2019   MAMMOGRAM  06/18/2020   DTaP/Tdap/Td (3 - Td or Tdap) 10/17/2020   COVID-19 Vaccine (4 - 2023-24 season) 10/06/2021      Past Medical History:  Diagnosis Date   Allergy    Anxiety    "take RX prn"   Arthritis    "all over"   Asthma     Chronic bronchitis ([place])    GERD (gastroesophageal reflux disease)    Hypertension    Insomnia 05/31/2011   OSA on CPAP    wears CPAP sometimes (06/23/2014)   Other and unspecified hyperlipidemia 08/04/2012   PONV (postoperative nausea and vomiting)    Shortness of breath dyspnea    with exertion   Shoulder pain, bilateral 10/18/2010   Vitamin D deficiency 12/21/2015   Past Surgical History:  Procedure Laterality Date   APPENDECTOMY  1966   BREAST EXCISIONAL BIOPSY Right    CHOLECYSTECTOMY OPEN  1981   FRACTURE SURGERY     ORIF ANKLE FRACTURE Right 06/23/2014   ORIF ANKLE FRACTURE Right 06/23/2014   Procedure: Take Down Malunion Right Ankle, Open Reduction Internal Fixation Right Ankle, Possible Syndesmosis Repair, Medial Joint Debridement;  Green: Newt Minion, MD;  Location: [facility];  Service: Orthopedics;  Laterality: Right;   [place]   w/BSO    reports that she quit smoking about 38 years ago. Her smoking use included cigarettes. She has a 0.20 pack-year smoking history. She has never used smokeless tobacco. She reports that she does not drink alcohol and does not use drugs. family history includes Arthritis in her father, mother, and another family member; Cancer in an  other family member; Heart disease in her father and mother; Hypertension in her father and mother. Allergies  Allergen Reactions   Amoxicillin-Pot Clavulanate Hives    Hives   Azithromycin Other (See Comments)    abd pain   Celebrex [Celecoxib] Itching   Lipitor [Atorvastatin] Itching   Current Outpatient Medications on File Prior to Visit  Medication Sig Dispense Refill   acetaminophen (TYLENOL) 325 MG tablet Take 2 tablets (650 mg total) by mouth every 6 (six) hours as needed for moderate pain. 30 tablet 0   albuterol (VENTOLIN HFA) 108 (90 Base) MCG/ACT inhaler Inhale 2 puffs into the lungs every 6 (six) hours as needed for wheezing or shortness of breath.  8 g 5   diclofenac (VOLTAREN) 75 MG EC tablet Take 1 tablet (75 mg total) by mouth 2 (two) times daily. 60 tablet 1   ezetimibe (ZETIA) 10 MG tablet Take 1 tablet (10 mg total) by mouth daily. 90 tablet 3   furosemide (LASIX) 80 MG tablet 1 by mouth twice daily as needed for swelling 180 tablet 3   hydrochlorothiazide (HYDRODIURIL) 25 MG tablet      hydrOXYzine (ATARAX) 25 MG tablet TAKE 1-2 TABS BY MOUTH THREE TIMES PER DAY AS NEEDED FOR ITCHING 540 tablet 1   ketoconazole (NIZORAL) 2 % cream Apply 1 Application topically daily. 30 g 2   potassium chloride (KLOR-CON 10) 10 MEQ tablet Take 1 tablet (10 mEq total) by mouth daily. 90 tablet 3   Semaglutide, 1 MG/DOSE, 4 MG/3ML SOPN Inject 1 mg as directed once a week. 9 mL 3   traMADol (ULTRAM) 50 MG tablet TAKE 1 TABLET BY MOUTH 4 TIMES A DAY AS NEEDED 120 tablet 2   meloxicam (MOBIC) 15 MG tablet Take 15 mg by mouth daily as needed.     No current facility-administered medications on file prior to visit.        ROS:  All others reviewed and negative.  Objective        PE:  BP 130/78   Pulse 95   Temp 97.6 F (36.4 C) (Oral)   Ht 5' (1.524 m)   Wt 287 lb (130.2 kg)   SpO2 93%   BMI 56.05 kg/m                 Constitutional: Pt appears in NAD               HENT: Head: NCAT.                Right Ear: External ear normal.                 Left Ear: External ear normal.                Eyes: . Pupils are equal, round, and reactive to light. Conjunctivae and EOM are normal               Nose: without d/c or deformity               Neck: Neck supple. Gross normal ROM               Cardiovascular: Normal rate and regular rhythm.                 Pulmonary/Chest: Effort normal and breath sounds without rales or wheezing.                Abd:  Soft, NT, ND, + BS, no  organomegaly               Neurological: Pt is alert. At baseline orientation, motor grossly intact               Skin: Skin is warm.LE edema - trace to 1+ bilateral, right mid  dorsal foot with 2 cm area red, tender, swelling               Psychiatric: Pt behavior is normal without agitation   Micro: none  Cardiac tracings I have personally interpreted today:  none  Pertinent Radiological findings (summarize): none   Lab Results  Component Value Date   WBC 6.9 03/22/2022   HGB 13.4 03/22/2022   HCT 41.2 03/22/2022   PLT 344.0 03/22/2022   GLUCOSE 99 03/22/2022   CHOL 183 03/22/2022   TRIG 105.0 03/22/2022   HDL 54.70 03/22/2022   LDLDIRECT 134.1 05/02/2012   LDLCALC 107 (H) 03/22/2022   ALT 18 03/22/2022   AST 24 03/22/2022   NA 140 03/22/2022   K 4.4 03/22/2022   CL 102 03/22/2022   CREATININE 1.17 03/22/2022   BUN 16 03/22/2022   CO2 27 03/22/2022   TSH 1.30 03/22/2022   INR 1.05 06/22/2014   HGBA1C 5.8 03/22/2022   MICROALBUR 2.8 (H) 03/27/2022   Assessment/Plan:  Betty Green is a 77 y.o. Black or African American [2] female with  has a past medical history of Allergy, Anxiety, Arthritis, Asthma, Chronic bronchitis ([place]), GERD (gastroesophageal reflux disease), Hypertension, Insomnia (05/31/2011), OSA on CPAP, Other and unspecified hyperlipidemia (08/04/2012), PONV (postoperative nausea and vomiting), Shortness of breath dyspnea, Shoulder pain, bilateral (10/18/2010), and Vitamin D deficiency (12/21/2015).  Encounter for well adult exam with abnormal findings Age and sex appropriate education and counseling updated with regular exercise and diet Referrals for preventative services - for dxa and mammogram scheduled soon,  Immunizations addressed - declines tdap and covid booster, for shingrix at pharmacy Smoking counseling  - none needed Evidence for depression or other mood disorder - none significant Most recent labs reviewed. I have personally reviewed and have noted: 1) the patient's medical and social history 2) The patient's current medications and supplements 3) The patient's height, weight, and BMI have been recorded in the  chart   Hypertension BP Readings from Last 3 Encounters:  04/30/22 130/78  03/22/22 130/78  12/07/21 132/80   Stable, pt to continue medical treatment hct 25 qd   Morbid obesity (HCC) For change ozempic to mounjaro 2.5 mg weekly as above  Prediabetes For change ozempic to mounjaro 2.5 mg once weekly   Cellulitis of right foot Mild to mod, for antibx course levaquin asd,  to f/u any worsening symptoms or concerns  Followup: Return in about 6 months (around 10/31/2022).  Cathlean Cower, MD 04/30/2022 8:58 PM [facility]

## 2022-04-30 NOTE — Assessment & Plan Note (Signed)
For change ozempic to mounjaro 2.5 mg weekly as above

## 2022-04-30 NOTE — Assessment & Plan Note (Signed)
For change ozempic to mounjaro 2.5 mg once weekly

## 2022-04-30 NOTE — Patient Instructions (Signed)
Ok to change the ozempic to mounjaro 2.5 mg weekly  Please take all new medication as prescribed - the antibiotic  Please continue all other medications as before, and refills have been done if requested.  Please have the pharmacy call with any other refills you may need.  Please continue your efforts at being more active, low cholesterol diet, and weight control.  You are otherwise up to date with prevention measures today.  Please keep your appointments with your specialists as you may have planned  No further lab work today  Please make an Appointment to return in 6 months, or sooner if needed

## 2022-04-30 NOTE — Assessment & Plan Note (Signed)
Mild to mod, for antibx course levaquin asd,  to f/u any worsening symptoms or concerns

## 2022-04-30 NOTE — Assessment & Plan Note (Signed)
Age and sex appropriate education and counseling updated with regular exercise and diet Referrals for preventative services - for dxa and mammogram scheduled soon,  Immunizations addressed - declines tdap and covid booster, for shingrix at pharmacy Smoking counseling  - none needed Evidence for depression or other mood disorder - none significant Most recent labs reviewed. I have personally reviewed and have noted: 1) the patient's medical and social history 2) The patient's current medications and supplements 3) The patient's height, weight, and BMI have been recorded in the chart

## 2022-05-01 ENCOUNTER — Other Ambulatory Visit: Payer: Self-pay | Admitting: Internal Medicine

## 2022-05-01 DIAGNOSIS — Z780 Asymptomatic menopausal state: Secondary | ICD-10-CM

## 2022-05-01 DIAGNOSIS — Z1231 Encounter for screening mammogram for malignant neoplasm of breast: Secondary | ICD-10-CM

## 2022-05-03 ENCOUNTER — Other Ambulatory Visit: Payer: Medicare PPO

## 2022-05-03 ENCOUNTER — Ambulatory Visit: Payer: Medicare PPO

## 2022-05-08 ENCOUNTER — Telehealth: Payer: Self-pay | Admitting: Internal Medicine

## 2022-05-08 NOTE — Telephone Encounter (Signed)
Patient called stating that a prior authorization needs to be done on Mounjaro.  Please call patient and let her know when this is done.  [phone]

## 2022-05-11 ENCOUNTER — Other Ambulatory Visit: Payer: Self-pay | Admitting: Internal Medicine

## 2022-05-11 MED ORDER — ZEPBOUND 2.5 MG/0.5ML ~~LOC~~ SOAJ: 2.5000 mg | SUBCUTANEOUS | 3 refills | Status: DC

## 2022-05-11 NOTE — Telephone Encounter (Signed)
Ok for change to zepbound - done erx

## 2022-05-11 NOTE — Telephone Encounter (Signed)
Greggory Keen can only be approved for type 2 diabetes, please change to [facility] or Zepbound as these are indicated for weight loss.

## 2022-05-14 NOTE — Telephone Encounter (Signed)
Notified pt w/ med change../lmb 

## 2022-05-21 ENCOUNTER — Encounter: Payer: Self-pay | Admitting: *Deleted

## 2022-05-22 ENCOUNTER — Other Ambulatory Visit: Payer: Self-pay | Admitting: Internal Medicine

## 2022-05-24 DIAGNOSIS — I739 Peripheral vascular disease, unspecified: Secondary | ICD-10-CM | POA: Diagnosis not present

## 2022-05-24 DIAGNOSIS — M2142 Flat foot [pes planus] (acquired), left foot: Secondary | ICD-10-CM | POA: Diagnosis not present

## 2022-05-24 DIAGNOSIS — Q666 Other congenital valgus deformities of feet: Secondary | ICD-10-CM | POA: Diagnosis not present

## 2022-05-24 DIAGNOSIS — M792 Neuralgia and neuritis, unspecified: Secondary | ICD-10-CM | POA: Diagnosis not present

## 2022-05-24 DIAGNOSIS — M2141 Flat foot [pes planus] (acquired), right foot: Secondary | ICD-10-CM | POA: Diagnosis not present

## 2022-05-29 NOTE — Telephone Encounter (Signed)
Patient called again and said that this prescription that it needs to be pre authorized.

## 2022-05-31 ENCOUNTER — Encounter: Payer: Self-pay | Admitting: Internal Medicine

## 2022-06-05 ENCOUNTER — Other Ambulatory Visit: Payer: Self-pay | Admitting: Internal Medicine

## 2022-06-06 ENCOUNTER — Ambulatory Visit (INDEPENDENT_AMBULATORY_CARE_PROVIDER_SITE_OTHER): Payer: Medicare HMO

## 2022-06-06 ENCOUNTER — Ambulatory Visit: Payer: Medicare HMO | Admitting: Podiatry

## 2022-06-06 DIAGNOSIS — M19071 Primary osteoarthritis, right ankle and foot: Secondary | ICD-10-CM | POA: Diagnosis not present

## 2022-06-06 DIAGNOSIS — M79671 Pain in right foot: Secondary | ICD-10-CM

## 2022-06-06 DIAGNOSIS — L03115 Cellulitis of right lower limb: Secondary | ICD-10-CM

## 2022-06-06 MED ORDER — BETAMETHASONE SOD PHOS & ACET 6 (3-3) MG/ML IJ SUSP
3.0000 mg | Freq: Once | INTRAMUSCULAR | Status: AC
Start: 2022-06-06 — End: 2022-06-06
  Administered 2022-06-06: 3 mg via INTRA_ARTICULAR

## 2022-06-06 NOTE — Progress Notes (Signed)
Chief Complaint  Patient presents with   Foot Problem    Patient came in today for top of the foot rash,  started 3 weeks, swelling, skin dark, having some pain     HPI: 77 y.o. female presenting today as a new patient referral from her PCP for evaluation of infection to the right forefoot that began about 3 weeks ago.  Idiopathic onset.  Denies injuring the foot or any change in activity.  Patient states that her PCP prescribed antibiotics which seem to resolve her symptoms but she continues to have some discoloration to the dorsum of the foot.  Patient also states that she has a history of ORIF of the right ankle.  She has been experiencing ankle pain now progressively for the last several years.  Patient states that she had recent x-rays performed at her doctor's office.  Declined x-rays today.  She has not done anything recently for treatment  Past Medical History:  Diagnosis Date   Allergy    Anxiety    "take RX prn"   Arthritis    "all over"   Asthma    Chronic bronchitis (HCC)    GERD (gastroesophageal reflux disease)    Hypertension    Insomnia 05/31/2011   OSA on CPAP    wears CPAP sometimes (06/23/2014)   Other and unspecified hyperlipidemia 08/04/2012   PONV (postoperative nausea and vomiting)    Shortness of breath dyspnea    with exertion   Shoulder pain, bilateral 10/18/2010   Vitamin D deficiency 12/21/2015    Past Surgical History:  Procedure Laterality Date   APPENDECTOMY  1966   BREAST EXCISIONAL BIOPSY Right    CHOLECYSTECTOMY OPEN  1981   FRACTURE SURGERY     ORIF ANKLE FRACTURE Right 06/23/2014   ORIF ANKLE FRACTURE Right 06/23/2014   Procedure: Take Down Malunion Right Ankle, Open Reduction Internal Fixation Right Ankle, Possible Syndesmosis Repair, Medial Joint Debridement;  Surgeon: Nadara Mustard, MD;  Location: [facility] OR;  Service: Orthopedics;  Laterality: Right;   TONSILLECTOMY  1963   TOTAL ABDOMINAL HYSTERECTOMY  1980   w/BSO    Allergies   Allergen Reactions   Amoxicillin-Pot Clavulanate Hives    Hives   Azithromycin Other (See Comments)    abd pain   Celebrex [Celecoxib] Itching   Lipitor [Atorvastatin] Itching     RT foot 06/06/2022  Physical Exam: General: The patient is alert and oriented x3 in no acute distress.  Dermatology: No open wounds.  No break in the skin.  There is some hyperpigmentation along the dorsum of the foot.  Please see above noted photo  Vascular: Skin is warm to touch.  Capillary refill within normal limits.  No erythema.  Mild to moderate edema noted throughout the foot.  Please see above noted photo  Neurological: Grossly intact via light touch  Musculoskeletal Exam: No pedal deformities noted.  There is some tenderness with palpation along the right ankle joint.  No crepitus.  No pain with range of motion.  Radiographic Exam RT foot and ankle 06/06/2022:  Declined xrays today  Assessment/Plan of Care: 1.  Arthritis/DJD right ankle -Patient evaluated.  Injection of 0.5 cc Celestone Soluspan injected along the medial and anterior aspect of the right ankle joint -Recommend good supportive shoes and sneakers.  Advised against going barefoot  2.  Cellulitis with discoloration dorsum of the right foot -It appears that the cellulitis and erythema around the foot has resolved.  Clinically there does not appear  to be any residual inflammation or infection within the foot. -Continue to monitor for now -Recommend daily foot lotion.  Foot miracle lotion was dispensed at checkout to apply 2 times daily        Felecia Shelling, DPM [facility]  Dr. Felecia Shelling, DPM    [address] [place], [place] [address]                Office [phone]  Fax [phone]

## 2022-06-08 ENCOUNTER — Other Ambulatory Visit (HOSPITAL_COMMUNITY): Payer: Self-pay

## 2022-06-08 ENCOUNTER — Telehealth: Payer: Self-pay

## 2022-06-08 NOTE — Telephone Encounter (Signed)
Pharmacy Patient Advocate Encounter  Prior Authorization for Greggory Keen has been approved by [employer] (ins).     Effective dates: 02/08/2022 through 02/05/2023   Copay is $11.20  Letter of approval is in media of chart

## 2022-06-14 NOTE — Telephone Encounter (Signed)
ERROR

## 2022-06-19 ENCOUNTER — Other Ambulatory Visit: Payer: Self-pay | Admitting: Podiatry

## 2022-06-19 DIAGNOSIS — M19071 Primary osteoarthritis, right ankle and foot: Secondary | ICD-10-CM

## 2022-06-19 DIAGNOSIS — L03115 Cellulitis of right lower limb: Secondary | ICD-10-CM

## 2022-06-19 DIAGNOSIS — M79671 Pain in right foot: Secondary | ICD-10-CM

## 2022-07-02 ENCOUNTER — Other Ambulatory Visit: Payer: Self-pay | Admitting: Internal Medicine

## 2022-07-27 ENCOUNTER — Encounter: Payer: Self-pay | Admitting: Internal Medicine

## 2022-07-30 MED ORDER — RYBELSUS 3 MG PO TABS: 3.0000 mg | ORAL_TABLET | Freq: Every day | ORAL | 3 refills | Status: DC

## 2022-07-30 NOTE — Telephone Encounter (Signed)
Ok to try change to rybelsus 3 mg daily - done  erx

## 2022-07-31 ENCOUNTER — Telehealth: Payer: Self-pay

## 2022-07-31 NOTE — Telephone Encounter (Signed)
Pharmacy Patient Advocate Encounter   Received notification from [facility] that prior authorization for Rybelsus 3MG  tablets is required/requested.   PA not submitted. Rybelsus is only approved for type 2 diabetes. No indication showing patient has diabetes.     Key # [id number] Green

## 2022-08-25 ENCOUNTER — Encounter: Payer: Self-pay | Admitting: Internal Medicine

## 2022-08-31 ENCOUNTER — Telehealth (INDEPENDENT_AMBULATORY_CARE_PROVIDER_SITE_OTHER): Payer: Medicare HMO | Admitting: Internal Medicine

## 2022-08-31 DIAGNOSIS — J4531 Mild persistent asthma with (acute) exacerbation: Secondary | ICD-10-CM

## 2022-08-31 DIAGNOSIS — R110 Nausea: Secondary | ICD-10-CM

## 2022-08-31 DIAGNOSIS — J019 Acute sinusitis, unspecified: Secondary | ICD-10-CM | POA: Diagnosis not present

## 2022-08-31 MED ORDER — PREDNISONE 10 MG PO TABS: ORAL_TABLET | ORAL | 0 refills | Status: DC

## 2022-08-31 MED ORDER — LEVOFLOXACIN 500 MG PO TABS: 500.0000 mg | ORAL_TABLET | Freq: Every day | ORAL | 0 refills | Status: AC

## 2022-08-31 MED ORDER — ONDANSETRON 4 MG PO TBDP: 4.0000 mg | ORAL_TABLET | Freq: Three times a day (TID) | ORAL | 1 refills | Status: DC | PRN

## 2022-08-31 MED ORDER — HYDROCODONE BIT-HOMATROP MBR 5-1.5 MG/5ML PO SOLN: 5.0000 mL | Freq: Four times a day (QID) | ORAL | 0 refills | Status: AC | PRN

## 2022-08-31 NOTE — Patient Instructions (Signed)
Please take all new medication as prescribed 

## 2022-08-31 NOTE — Progress Notes (Signed)
Patient ID: Betty Green, female   DOB: 1945-10-24, 77 y.o.   MRN: [id number]  Virtual Visit via Video Note  I connected with Betty Green on 09/02/22 at  3:00 PM EDT by a video enabled telemedicine application and verified that I am speaking with the correct person using two identifiers.  Location of all participants today Patient: at home Provider: at office   I discussed the limitations of evaluation and management by telemedicine and the availability of in person appointments. The patient expressed understanding and agreed to proceed.  History of Present Illness:  Here with 2-3 days acute onset fever, facial pain, pressure, headache, general weakness and malaise, and greenish d/c, with mild ST and cough, but pt denies chest pain, wheezing, increased sob or doe, orthopnea, PND, increased LE swelling, palpitations, dizziness or syncope, except for mild wheezing sob since last pm.   Pt denies polydipsia, polyuria, or new focal neuro s/s.    Pt denies recent wt loss, night sweats, loss of appetite, or other constitutional symptoms    Also has mild nausea mild intermittent without vomiting.   Past Medical History:  Diagnosis Date   Allergy    Anxiety    "take RX prn"   Arthritis    "all over"   Asthma    Chronic bronchitis (HCC)    GERD (gastroesophageal reflux disease)    Hypertension    Insomnia 05/31/2011   OSA on CPAP    wears CPAP sometimes (06/23/2014)   Other and unspecified hyperlipidemia 08/04/2012   PONV (postoperative nausea and vomiting)    Shortness of breath dyspnea    with exertion   Shoulder pain, bilateral 10/18/2010   Vitamin D deficiency 12/21/2015   Past Surgical History:  Procedure Laterality Date   APPENDECTOMY  1966   BREAST EXCISIONAL BIOPSY Right    CHOLECYSTECTOMY OPEN  1981   FRACTURE SURGERY     ORIF ANKLE FRACTURE Right 06/23/2014   ORIF ANKLE FRACTURE Right 06/23/2014   Procedure: Take Down Malunion Right Ankle, Open Reduction Internal Fixation Right  Ankle, Possible Syndesmosis Repair, Medial Joint Debridement;  Surgeon: Nadara Mustard, MD;  Location: [facility] OR;  Service: Orthopedics;  Laterality: Right;   TONSILLECTOMY  1963   TOTAL ABDOMINAL HYSTERECTOMY  1980   w/BSO    reports that she quit smoking about 38 years ago. Her smoking use included cigarettes. She started smoking about 40 years ago. She has a 0.2 pack-year smoking history. She has never used smokeless tobacco. She reports that she does not drink alcohol and does not use drugs. family history includes Arthritis in her father, mother, and another family member; Cancer in an other family member; Heart disease in her father and mother; Hypertension in her father and mother. Allergies  Allergen Reactions   Amoxicillin-Pot Clavulanate Hives    Hives   Azithromycin Other (See Comments)    abd pain   Celebrex [Celecoxib] Itching   Lipitor [Atorvastatin] Itching    Observations/Objective: Alert, NAD, appropriate mood and affect, resps normal, cn 2-12 intact, moves all 4s, no visible rash or swelling Lab Results  Component Value Date   WBC 6.9 03/22/2022   HGB 13.4 03/22/2022   HCT 41.2 03/22/2022   PLT 344.0 03/22/2022   GLUCOSE 99 03/22/2022   CHOL 183 03/22/2022   TRIG 105.0 03/22/2022   HDL 54.70 03/22/2022   LDLDIRECT 134.1 05/02/2012   LDLCALC 107 (H) 03/22/2022   ALT 18 03/22/2022   AST 24 03/22/2022   NA  140 03/22/2022   K 4.4 03/22/2022   CL 102 03/22/2022   CREATININE 1.17 03/22/2022   BUN 16 03/22/2022   CO2 27 03/22/2022   TSH 1.30 03/22/2022   INR 1.05 06/22/2014   HGBA1C 5.8 03/22/2022   MICROALBUR 2.8 (H) 03/27/2022   Assessment and Plan: See notes  Follow Up Instructions: See notes   I discussed the assessment and treatment plan with the patient. The patient was provided an opportunity to ask questions and all were answered. The patient agreed with the plan and demonstrated an understanding of the instructions.   The patient was advised to call  back or seek an in-person evaluation if the symptoms worsen or if the condition fails to improve as anticipated.   Oliver Barre, MD

## 2022-09-02 ENCOUNTER — Encounter: Payer: Self-pay | Admitting: Internal Medicine

## 2022-09-02 DIAGNOSIS — R110 Nausea: Secondary | ICD-10-CM | POA: Insufficient documentation

## 2022-09-02 DIAGNOSIS — J45901 Unspecified asthma with (acute) exacerbation: Secondary | ICD-10-CM | POA: Insufficient documentation

## 2022-09-02 NOTE — Assessment & Plan Note (Signed)
Mild to mod, for antibx course levaquin 500 every day, cough med prn,  to f/u any worsening symptoms or concerns

## 2022-09-02 NOTE — Assessment & Plan Note (Signed)
Mild intermittent without vomiting, - for zofran prn

## 2022-09-02 NOTE — Assessment & Plan Note (Signed)
Mild to mod, for prednisone taper,, cont albuterol hfa prn,,  to f/u any worsening symptoms or concerns

## 2022-09-03 ENCOUNTER — Encounter: Payer: Self-pay | Admitting: Internal Medicine

## 2022-09-03 MED ORDER — DOXYCYCLINE HYCLATE 100 MG PO TABS: 100.0000 mg | ORAL_TABLET | Freq: Two times a day (BID) | ORAL | 0 refills | Status: DC

## 2022-09-15 ENCOUNTER — Other Ambulatory Visit: Payer: Self-pay | Admitting: Internal Medicine

## 2022-09-19 ENCOUNTER — Other Ambulatory Visit: Payer: Self-pay | Admitting: Internal Medicine

## 2022-09-20 ENCOUNTER — Encounter (INDEPENDENT_AMBULATORY_CARE_PROVIDER_SITE_OTHER): Payer: Self-pay

## 2022-09-22 ENCOUNTER — Encounter: Payer: Self-pay | Admitting: Internal Medicine

## 2022-09-24 ENCOUNTER — Encounter: Payer: Self-pay | Admitting: Internal Medicine

## 2022-09-27 ENCOUNTER — Telehealth: Payer: Self-pay | Admitting: Pharmacy Technician

## 2022-09-27 ENCOUNTER — Other Ambulatory Visit (HOSPITAL_COMMUNITY): Payer: Self-pay

## 2022-09-27 NOTE — Telephone Encounter (Signed)
Pharmacy Patient Advocate Encounter  Received notification from [facility] that Prior Authorization for Rybelsus has been APPROVED from 02/05/22 to 02/05/23   PA #/Case ID/Reference #: [id number]

## 2022-10-10 ENCOUNTER — Encounter: Payer: Self-pay | Admitting: Internal Medicine

## 2022-10-10 MED ORDER — RYBELSUS 7 MG PO TABS: 7.0000 mg | ORAL_TABLET | Freq: Every day | ORAL | 3 refills | Status: DC

## 2022-10-19 DIAGNOSIS — H30891 Other chorioretinal inflammations, right eye: Secondary | ICD-10-CM | POA: Diagnosis not present

## 2022-10-19 DIAGNOSIS — H43813 Vitreous degeneration, bilateral: Secondary | ICD-10-CM | POA: Diagnosis not present

## 2022-10-19 DIAGNOSIS — Z961 Presence of intraocular lens: Secondary | ICD-10-CM | POA: Diagnosis not present

## 2022-10-24 ENCOUNTER — Other Ambulatory Visit: Payer: Self-pay | Admitting: Internal Medicine

## 2022-10-25 ENCOUNTER — Other Ambulatory Visit: Payer: Self-pay

## 2022-10-31 ENCOUNTER — Ambulatory Visit: Payer: Medicare HMO | Admitting: Internal Medicine

## 2022-11-02 ENCOUNTER — Ambulatory Visit: Payer: Medicare HMO | Admitting: Internal Medicine

## 2022-11-07 ENCOUNTER — Ambulatory Visit: Payer: Medicare HMO | Admitting: Internal Medicine

## 2022-11-07 ENCOUNTER — Ambulatory Visit: Payer: Medicare HMO

## 2022-11-07 ENCOUNTER — Other Ambulatory Visit: Payer: Medicare HMO

## 2022-12-13 ENCOUNTER — Encounter: Payer: Self-pay | Admitting: Internal Medicine

## 2022-12-14 MED ORDER — TIRZEPATIDE 5 MG/0.5ML ~~LOC~~ SOAJ: 5.0000 mg | SUBCUTANEOUS | 3 refills | Status: DC

## 2022-12-26 DIAGNOSIS — M170 Bilateral primary osteoarthritis of knee: Secondary | ICD-10-CM | POA: Diagnosis not present

## 2022-12-26 DIAGNOSIS — M19012 Primary osteoarthritis, left shoulder: Secondary | ICD-10-CM | POA: Diagnosis not present

## 2023-01-01 ENCOUNTER — Ambulatory Visit: Payer: Medicare HMO | Admitting: Internal Medicine

## 2023-01-07 ENCOUNTER — Other Ambulatory Visit: Payer: Self-pay | Admitting: Internal Medicine

## 2023-01-07 ENCOUNTER — Ambulatory Visit: Payer: Medicare HMO | Admitting: Internal Medicine

## 2023-01-18 ENCOUNTER — Ambulatory Visit: Payer: Medicare HMO | Admitting: Internal Medicine

## 2023-01-24 ENCOUNTER — Ambulatory Visit: Payer: Medicare HMO

## 2023-01-24 VITALS — Ht 60.0 in | Wt 277.0 lb

## 2023-01-24 DIAGNOSIS — Z0000 Encounter for general adult medical examination without abnormal findings: Secondary | ICD-10-CM | POA: Diagnosis not present

## 2023-01-24 NOTE — Progress Notes (Signed)
Subjective:   Betty Green is a 77 y.o. female who presents for Medicare Annual (Subsequent) preventive examination.  Visit Complete: Virtual I connected with  Shyah R Busbee on 01/24/23 by a audio enabled telemedicine application and verified that I am speaking with the correct person using two identifiers.  Patient Location: Home  Provider Location: Office/Clinic  I discussed the limitations of evaluation and management by telemedicine. The patient expressed understanding and agreed to proceed.  Vital Signs: Because this visit was a virtual/telehealth visit, some criteria may be missing or patient reported. Any vitals not documented were not able to be obtained and vitals that have been documented are patient reported.   Cardiac Risk Factors include: advanced age (>57men, >18 women);dyslipidemia;Other (see comment);hypertension, Risk factor comments: OSA, AKI     Objective:    Today's Vitals   01/24/23 1355 01/24/23 1356  Weight: 277 lb (125.6 kg)   Height: 5' (1.524 m)   PainSc:  10-Worst pain ever   Body mass index is 54.1 kg/m.     01/24/2023    2:02 PM 11/03/2021   10:59 AM 06/23/2014    4:10 PM 06/22/2014    2:22 PM 01/28/2014    6:19 PM  Advanced Directives  Does Patient Have a Medical Advance Directive? No No No No No  Would patient like information on creating a medical advance directive?  No - Patient declined No - patient declined information No - patient declined information No - patient declined information    Current Medications (verified) Outpatient Encounter Medications as of 01/24/2023  Medication Sig   acetaminophen (TYLENOL) 325 MG tablet Take 2 tablets (650 mg total) by mouth every 6 (six) hours as needed for moderate pain.   albuterol (VENTOLIN HFA) 108 (90 Base) MCG/ACT inhaler Inhale 2 puffs into the lungs every 6 (six) hours as needed for wheezing or shortness of breath.   diclofenac (VOLTAREN) 75 MG EC tablet TAKE 1 TABLET BY MOUTH TWICE A DAY    doxycycline (VIBRA-TABS) 100 MG tablet Take 1 tablet (100 mg total) by mouth 2 (two) times daily.   ezetimibe (ZETIA) 10 MG tablet Take 1 tablet (10 mg total) by mouth daily.   furosemide (LASIX) 80 MG tablet 1 by mouth twice daily as needed for swelling   hydrochlorothiazide (HYDRODIURIL) 25 MG tablet    hydrOXYzine (ATARAX) 25 MG tablet TAKE 1-2 TABS BY MOUTH THREE TIMES PER DAY AS NEEDED FOR ITCHING   ketoconazole (NIZORAL) 2 % cream Apply 1 Application topically daily.   meloxicam (MOBIC) 15 MG tablet Take 15 mg by mouth daily as needed.   ondansetron (ZOFRAN-ODT) 4 MG disintegrating tablet Take 1 tablet (4 mg total) by mouth every 8 (eight) hours as needed for nausea or vomiting.   potassium chloride (KLOR-CON 10) 10 MEQ tablet Take 1 tablet (10 mEq total) by mouth daily.   tirzepatide [facility]) 5 MG/0.5ML Pen Inject 5 mg into the skin once a week.   traMADol (ULTRAM) 50 MG tablet TAKE 1 TABLET BY MOUTH EVERY 6 HOURS AS NEEDED.   predniSONE (DELTASONE) 10 MG tablet 3 tabs by mouth per day for 3 days,2tabs per day for 3 days,1tab per day for 3 days (Patient not taking: Reported on 01/24/2023)   No facility-administered encounter medications on file as of 01/24/2023.    Allergies (verified) Amoxicillin-pot clavulanate, Levofloxacin, Azithromycin, Celebrex [celecoxib], and Lipitor [atorvastatin]   History: Past Medical History:  Diagnosis Date   Allergy    Anxiety    "  take RX prn"   Arthritis    "all over"   Asthma    Chronic bronchitis (HCC)    GERD (gastroesophageal reflux disease)    Hypertension    Insomnia 05/31/2011   OSA on CPAP    wears CPAP sometimes (06/23/2014)   Other and unspecified hyperlipidemia 08/04/2012   PONV (postoperative nausea and vomiting)    Shortness of breath dyspnea    with exertion   Shoulder pain, bilateral 10/18/2010   Vitamin D deficiency 12/21/2015   Past Surgical History:  Procedure Laterality Date   APPENDECTOMY  1966   BREAST  EXCISIONAL BIOPSY Right    CHOLECYSTECTOMY OPEN  1981   FRACTURE SURGERY     ORIF ANKLE FRACTURE Right 06/23/2014   ORIF ANKLE FRACTURE Right 06/23/2014   Procedure: Take Down Malunion Right Ankle, Open Reduction Internal Fixation Right Ankle, Possible Syndesmosis Repair, Medial Joint Debridement;  Surgeon: Nadara Mustard, MD;  Location: [facility] OR;  Service: Orthopedics;  Laterality: Right;   TONSILLECTOMY  1963   TOTAL ABDOMINAL HYSTERECTOMY  1980   w/BSO   Family History  Problem Relation Age of Onset   Arthritis Other    Arthritis Mother    Heart disease Mother    Hypertension Mother    Arthritis Father    Heart disease Father    Hypertension Father    Cancer Other        colon and prostate cancer   Social History   Socioeconomic History   Marital status: Single    Spouse name: Not on file   Number of children: 2   Years of education: 22   Highest education level: Not on file  Occupational History   Occupation: retired    Associate Professor: [employer]  Tobacco Use   Smoking status: Former    Current packs/day: 0.00    Average packs/day: 0.1 packs/day for 2.0 years (0.2 ttl pk-yrs)    Types: Cigarettes    Start date: 02/05/1982    Quit date: 02/06/1984    Years since quitting: 38.9   Smokeless tobacco: Never  Vaping Use   Vaping status: Never Used  Substance and Sexual Activity   Alcohol use: No   Drug use: No   Sexual activity: Not Currently  Other Topics Concern   Not on file  Social History Narrative   Lives with her sister   Social Drivers of Health   Financial Resource Strain: Low Risk  (01/24/2023)   Overall Financial Resource Strain (CARDIA)    Difficulty of Paying Living Expenses: Not hard at all  Food Insecurity: No Food Insecurity (01/24/2023)   Hunger Vital Sign    Worried About Running Out of Food in the Last Year: Never true    Ran Out of Food in the Last Year: Never true  Transportation Needs: No Transportation Needs (01/24/2023)   PRAPARE -  Administrator, Civil Service (Medical): No    Lack of Transportation (Non-Medical): No  Physical Activity: Inactive (01/24/2023)   Exercise Vital Sign    Days of Exercise per Week: 0 days    Minutes of Exercise per Session: 0 min  Stress: No Stress Concern Present (01/24/2023)   [employer] of Occupational Health - Occupational Stress Questionnaire    Feeling of Stress : Not at all  Social Connections: Moderately Isolated (01/24/2023)   Social Connection and Isolation Panel [NHANES]    Frequency of Communication with Friends and Family: More than three times a week  Frequency of Social Gatherings with Friends and Family: Not on file    Attends Religious Services: More than 4 times per year    Active Member of [employer] or Organizations: No    Attends Engineer, structural: Never    Marital Status: Never married    Tobacco Counseling Counseling given: Not Answered   Clinical Intake:  Pre-visit preparation completed: Yes  Pain : 0-10 Pain Score: 10-Worst pain ever Pain Type: Chronic pain Pain Location: Shoulder (both knees and lt shoulder) Pain Orientation: Left Pain Descriptors / Indicators: Aching, Discomfort Pain Onset: More than a month ago Pain Frequency: Constant Pain Relieving Factors: Tramadol, Moxicam  Pain Relieving Factors: Tramadol, Moxicam  BMI - recorded: 54.1 Nutritional Status: BMI > 30  Obese Nutritional Risks: None Diabetes: No  How often do you need to have someone help you when you read instructions, pamphlets, or other written materials from your doctor or pharmacy?: 1 - Never  Interpreter Needed?: No  Information entered by :: Erryn Dickison, RMA   Activities of Daily Living    01/24/2023    1:59 PM  In your present state of health, do you have any difficulty performing the following activities:  Hearing? 0  Vision? 0  Difficulty concentrating or making decisions? 0  Walking or climbing stairs? 0  Dressing or  bathing? 0  Doing errands, shopping? 0  Preparing Food and eating ? N  Using the Toilet? N  In the past six months, have you accidently leaked urine? N  Do you have problems with loss of bowel control? N  Managing your Medications? N  Managing your Finances? N  Housekeeping or managing your Housekeeping? N    Patient Care Team: Corwin Levins, MD as PCP - General (Internal Medicine) Oretha Milch, MD as Consulting Physician (Pulmonary Disease) Martyn Malay, MD as Referring Physician (Ophthalmology) Rich Reining, MD (Ophthalmology)  Indicate any recent Medical Services you may have received from other than [facility] providers in the past year (date may be approximate).     Assessment:   This is a routine wellness examination for Troy.  Hearing/Vision screen Hearing Screening - Comments:: Denies hearing difficulties   Vision Screening - Comments:: Wears eyeglasses   Goals Addressed             This Visit's Progress    Patient Stated   On track    Hopes to lose weight so she can get knees operated on and get more active      Depression Screen    01/24/2023    2:06 PM 03/22/2022    2:33 PM 11/03/2021   10:58 AM 06/06/2020    3:04 PM 06/06/2020    2:17 PM 11/18/2019   10:47 AM 06/25/2019    4:10 PM  PHQ 2/9 Scores  PHQ - 2 Score 3 0 0 0 0 0 0  PHQ- 9 Score 3 0     0    Fall Risk    01/24/2023    2:02 PM 03/22/2022    2:33 PM 11/03/2021   10:52 AM 06/06/2020    3:04 PM 06/06/2020    2:17 PM  Fall Risk   Falls in the past year? 0 0 0 0 0  Number falls in past yr: 0 0 0  0  Injury with Fall? 0 0 0  0  Risk for fall due to : No Fall Risks No Fall Risks Orthopedic patient;Impaired balance/gait    Follow up Falls prevention discussed;Falls  evaluation completed Falls evaluation completed Falls prevention discussed;Education provided      MEDICARE RISK AT HOME: Medicare Risk at Home Any stairs in or around the home?: No Home free of loose throw rugs in walkways, pet  beds, electrical cords, etc?: Yes Adequate lighting in your home to reduce risk of falls?: Yes Life alert?: No Use of a cane, walker or w/c?: Yes Grab bars in the bathroom?: Yes Shower chair or bench in shower?: Yes Elevated toilet seat or a handicapped toilet?: Yes  TIMED UP AND GO:  Was the test performed?  No    Cognitive Function:        01/24/2023    2:03 PM 11/03/2021   11:03 AM  6CIT Screen  What Year? 0 points 0 points  What month? 0 points 0 points  What time? 0 points 0 points  Count back from 20 0 points 0 points  Months in reverse 0 points 0 points  Repeat phrase 0 points 4 points  Total Score 0 points 4 points    Immunizations Immunization History  Administered Date(s) Administered   DTaP 10/06/1997   Fluad Quad(high Dose 65+) 11/18/2019, 11/08/2020, 11/24/2021   Influenza Split 10/18/2010, 10/30/2011   Influenza, High Dose Seasonal PF 11/14/2016   Influenza,inj,Quad PF,6+ Mos 10/20/2012, 03/24/2014, 12/14/2015   PFIZER Comirnaty(Gray Top)Covid-19 Tri-Sucrose Vaccine 03/28/2020   PFIZER(Purple Top)SARS-COV-2 Vaccination 04/22/2019, 05/13/2019   Pneumococcal Conjugate-13 07/16/2013   Pneumococcal Polysaccharide-23 08/24/2014   Tdap 10/18/2010    TDAP status: Due, Education has been provided regarding the importance of this vaccine. Advised may receive this vaccine at local pharmacy or Health Dept. Aware to provide a copy of the vaccination record if obtained from local pharmacy or Health Dept. Verbalized acceptance and understanding.  Flu Vaccine status: Due, Education has been provided regarding the importance of this vaccine. Advised may receive this vaccine at local pharmacy or Health Dept. Aware to provide a copy of the vaccination record if obtained from local pharmacy or Health Dept. Verbalized acceptance and understanding.  Pneumococcal vaccine status: Up to date  Covid-19 vaccine status: Information provided on how to obtain vaccines.   Qualifies  for Shingles Vaccine? Yes   Zostavax completed No   Shingrix Completed?: No.    Education has been provided regarding the importance of this vaccine. Patient has been advised to call insurance company to determine out of pocket expense if they have not yet received this vaccine. Advised may also receive vaccine at local pharmacy or Health Dept. Verbalized acceptance and understanding.  Screening Tests Health Maintenance  Topic Date Due   DEXA SCAN  02/08/2019   DTaP/Tdap/Td (3 - Td or Tdap) 02/05/2023 (Originally 10/17/2020)   COVID-19 Vaccine (4 - 2024-25 season) 04/05/2023 (Originally 10/07/2022)   MAMMOGRAM  04/29/2023 (Originally 06/18/2020)   INFLUENZA VACCINE  05/06/2023 (Originally 09/06/2022)   Zoster Vaccines- Shingrix (1 of 2) 05/06/2023 (Originally 06/04/1964)   Medicare Annual Wellness (AWV)  01/24/2024   Pneumonia Vaccine 93+ Years old  Completed   Hepatitis C Screening  Completed   HPV VACCINES  Aged Out   Fecal DNA (Cologuard)  Discontinued    Health Maintenance  Health Maintenance Due  Topic Date Due   DEXA SCAN  02/08/2019    Colorectal cancer screening: No longer required.   Mammogram status: Ordered 05/01/2022. Pt provided with contact info and advised to call to schedule appt.   Bone Density status: Ordered 05/01/2022. Pt provided with contact info and advised to call to schedule appt.  Lung Cancer Screening: (Low Dose CT Chest recommended if Age 46-80 years, 20 pack-year currently smoking OR have quit w/in 15years.) does not qualify.   Lung Cancer Screening Referral: N/A  Additional Screening:  Hepatitis C Screening: does qualify; Completed 01/10/2021  Vision Screening: Recommended annual ophthalmology exams for early detection of glaucoma and other disorders of the eye. Is the patient up to date with their annual eye exam?  No  Who is the provider or what is the name of the office in which the patient attends annual eye exams? Rich Reining, MD If pt is  not established with a provider, would they like to be referred to a provider to establish care? No .   Dental Screening: Recommended annual dental exams for proper oral hygiene    Community Resource Referral / Chronic Care Management: CRR required this visit?  No   CCM required this visit?  No     Plan:     I have personally reviewed and noted the following in the patient's chart:   Medical and social history Use of alcohol, tobacco or illicit drugs  Current medications and supplements including opioid prescriptions. Patient is currently taking opioid prescriptions. Information provided to patient regarding non-opioid alternatives. Patient advised to discuss non-opioid treatment plan with their provider. Functional ability and status Nutritional status Physical activity Advanced directives List of other physicians Hospitalizations, surgeries, and ER visits in previous 12 months Vitals Screenings to include cognitive, depression, and falls Referrals and appointments  In addition, I have reviewed and discussed with patient certain preventive protocols, quality metrics, and best practice recommendations. A written personalized care plan for preventive services as well as general preventive health recommendations were provided to patient.     Ronell Boldin L Millenia Waldvogel, CMA   01/24/2023   After Visit Summary: (MyChart) Due to this being a telephonic visit, the after visit summary with patients personalized plan was offered to patient via MyChart   Nurse Notes: Patient is due for a Flu vaccine, a Tetanus and a Covid vaccine.  She is due for her yearly mammogram and a DEXA, which order was placed in march of this year and patient is aware to call [facility] imaging to get scheduled.  She had no other concerns to address today.

## 2023-01-24 NOTE — Patient Instructions (Signed)
Betty Green , Thank you for taking time to come for your Medicare Wellness Visit. I appreciate your ongoing commitment to your health goals. Please review the following plan we discussed and let me know if I can assist you in the future.   Referrals/Orders/Follow-Ups/Clinician Recommendations: You are due for a Flu vaccine, a Tetanus vaccine and a Covid vaccine.  It was nice talking to you and hope you have a Merry Christmas.  Remember to call and get scheduled for >    You have an order for:   [x]   3D Mammogram  [x]   Bone Density     Please call for appointment:  [facility] [address] [place], [place] [address] [phone]   Make sure to wear two-piece clothing.  No lotions, powders, or deodorants the day of the appointment. Make sure to bring picture ID and insurance card.  Bring list of medications you are currently taking including any supplements.   Schedule your  screening mammogram through MyChart!   Log into your MyChart account.  Go to 'Visit' (or 'Appointments' if on mobile App) --> Schedule an Appointment  Under 'Select a Reason for Visit' choose the Mammogram Screening option.  Complete the pre-visit questions and select the time and place that best fits your schedule.    This is a list of the screening recommended for you and due dates:  Health Maintenance  Topic Date Due   DEXA scan (bone density measurement)  02/08/2019   DTaP/Tdap/Td vaccine (3 - Td or Tdap) 02/05/2023*   COVID-19 Vaccine (4 - 2024-25 season) 04/05/2023*   Mammogram  04/29/2023*   Flu Shot  05/06/2023*   Zoster (Shingles) Vaccine (1 of 2) 05/06/2023*   Medicare Annual Wellness Visit  01/24/2024   Pneumonia Vaccine  Completed   Hepatitis C Screening  Completed   HPV Vaccine  Aged Out   Cologuard (Stool DNA test)  Discontinued  *Topic was postponed. The date shown is not the original due date.    Advanced directives: (Declined) Advance directive  discussed with you today. Even though you declined this today, please call our office should you change your mind, and we can give you the proper paperwork for you to fill out.  Next Medicare Annual Wellness Visit scheduled for next year: Yes  Managing Pain Without Opioids Opioids are strong medicines used to treat moderate to severe pain. For some people, especially those who have long-term (chronic) pain, opioids may not be the best choice for pain management due to: Side effects like nausea, constipation, and sleepiness. The risk of addiction (opioid use disorder). The longer you take opioids, the greater your risk of addiction. Pain that lasts for more than 3 months is called chronic pain. Managing chronic pain usually requires more than one approach and is often provided by a team of health care providers working together (multidisciplinary approach). Pain management may be done at a pain management center or pain clinic. How to manage pain without the use of opioids Use non-opioid medicines Non-opioid medicines for pain may include: Over-the-counter or prescription non-steroidal anti-inflammatory drugs (NSAIDs). These may be the first medicines used for pain. They work well for muscle and bone pain, and they reduce swelling. Acetaminophen. This over-the-counter medicine may work well for milder pain but not swelling. Antidepressants. These may be used to treat chronic pain. A certain type of antidepressant (tricyclics) is often used. These medicines are given in lower doses for pain than when used for depression. Anticonvulsants.  These are usually used to treat seizures but may also reduce nerve (neuropathic) pain. Muscle relaxants. These relieve pain caused by sudden muscle tightening (spasms). You may also use a pain medicine that is applied to the skin as a patch, cream, or gel (topical analgesic), such as a numbing medicine. These may cause fewer side effects than medicines taken by  mouth. Do certain therapies as directed Some therapies can help with pain management. They include: Physical therapy. You will do exercises to gain strength and flexibility. A physical therapist may teach you exercises to move and stretch parts of your body that are weak, stiff, or painful. You can learn these exercises at physical therapy visits and practice them at home. Physical therapy may also involve: Massage. Heat wraps or applying heat or cold to affected areas. Electrical signals that interrupt pain signals (transcutaneous electrical nerve stimulation, TENS). Weak lasers that reduce pain and swelling (low-level laser therapy). Signals from your body that help you learn to regulate pain (biofeedback). Occupational therapy. This helps you to learn ways to function at home and work with less pain. Recreational therapy. This involves trying new activities or hobbies, such as a physical activity or drawing. Mental health therapy, including: Cognitive behavioral therapy (CBT). This helps you learn coping skills for dealing with pain. Acceptance and commitment therapy (ACT) to change the way you think and react to pain. Relaxation therapies, including muscle relaxation exercises and mindfulness-based stress reduction. Pain management counseling. This may be individual, family, or group counseling.  Receive medical treatments Medical treatments for pain management include: Nerve block injections. These may include a pain blocker and anti-inflammatory medicines. You may have injections: Near the spine to relieve chronic back or neck pain. Into joints to relieve back or joint pain. Into nerve areas that supply a painful area to relieve body pain. Into muscles (trigger point injections) to relieve some painful muscle conditions. A medical device placed near your spine to help block pain signals and relieve nerve pain or chronic back pain (spinal cord stimulation device). Acupuncture. Follow  these instructions at home Medicines Take over-the-counter and prescription medicines only as told by your health care provider. If you are taking pain medicine, ask your health care providers about possible side effects to watch out for. Do not drive or use heavy machinery while taking prescription opioid pain medicine. Lifestyle  Do not use drugs or alcohol to reduce pain. If you drink alcohol, limit how much you have to: 0-1 drink a day for women who are not pregnant. 0-2 drinks a day for men. Know how much alcohol is in a drink. In the [place] one drink equals one 12 oz bottle of beer (355 mL), one 5 oz glass of wine (148 mL), or one 1 oz glass of hard liquor (44 mL). Do not use any products that contain nicotine or tobacco. These products include cigarettes, chewing tobacco, and vaping devices, such as e-cigarettes. If you need help quitting, ask your health care provider. Eat a healthy diet and maintain a healthy weight. Poor diet and excess weight may make pain worse. Eat foods that are high in fiber. These include fresh fruits and vegetables, whole grains, and beans. Limit foods that are high in fat and processed sugars, such as fried and sweet foods. Exercise regularly. Exercise lowers stress and may help relieve pain. Ask your health care provider what activities and exercises are safe for you. If your health care provider approves, join an exercise class that combines movement and  stress reduction. Examples include yoga and tai chi. Get enough sleep. Lack of sleep may make pain worse. Lower stress as much as possible. Practice stress reduction techniques as told by your therapist. General instructions Work with all your pain management providers to find the treatments that work best for you. You are an important member of your pain management team. There are many things you can do to reduce pain on your own. Consider joining an online or in-person support group for people who have  chronic pain. Keep all follow-up visits. This is important. Where to find more information You can find more information about managing pain without opioids from: American Academy of Pain Medicine: painmed.org Institute for Chronic Pain: instituteforchronicpain.org American Chronic Pain Association: theacpa.org Contact a health care provider if: You have side effects from pain medicine. Your pain gets worse or does not get better with treatments or home therapy. You are struggling with anxiety or depression. Summary Many types of pain can be managed without opioids. Chronic pain may respond better to pain management without opioids. Pain is best managed when you and a team of health care providers work together. Pain management without opioids may include non-opioid medicines, medical treatments, physical therapy, mental health therapy, and lifestyle changes. Tell your health care providers if your pain gets worse or is not being managed well enough. This information is not intended to replace advice given to you by your health care provider. Make sure you discuss any questions you have with your health care provider. Document Revised: 05/04/2020 Document Reviewed: 05/04/2020 Elsevier Patient Education  2024 [employer].

## 2023-01-31 ENCOUNTER — Ambulatory Visit: Payer: Medicare HMO | Admitting: Internal Medicine

## 2023-02-01 ENCOUNTER — Ambulatory Visit: Payer: Medicare HMO | Admitting: Internal Medicine

## 2023-02-02 DIAGNOSIS — H52223 Regular astigmatism, bilateral: Secondary | ICD-10-CM | POA: Diagnosis not present

## 2023-02-12 ENCOUNTER — Telehealth: Payer: Medicare HMO

## 2023-02-12 ENCOUNTER — Encounter: Payer: Self-pay | Admitting: Internal Medicine

## 2023-02-12 NOTE — Telephone Encounter (Signed)
Needs ROV 

## 2023-02-13 ENCOUNTER — Other Ambulatory Visit: Payer: Self-pay | Admitting: Internal Medicine

## 2023-02-13 MED ORDER — DOXYCYCLINE HYCLATE 100 MG PO TABS: 100.0000 mg | ORAL_TABLET | Freq: Two times a day (BID) | ORAL | 0 refills | Status: DC

## 2023-02-18 ENCOUNTER — Other Ambulatory Visit (HOSPITAL_COMMUNITY): Payer: Self-pay

## 2023-02-20 ENCOUNTER — Telehealth: Payer: Self-pay | Admitting: Pharmacy Technician

## 2023-02-20 NOTE — Telephone Encounter (Signed)
 Pharmacy Patient Advocate Encounter  Received notification from [employer] that Prior Authorization for RYBELUS 3MG  TABLETS has been DENIED.  Full denial letter will be uploaded to the media tab. See denial reason below.      **PT HAS TO HAVE TYPE 2 DIABETES**  PA #/Case ID/Reference #: [id number]

## 2023-02-26 ENCOUNTER — Ambulatory Visit: Payer: Medicare HMO | Admitting: Internal Medicine

## 2023-03-06 ENCOUNTER — Encounter: Payer: Self-pay | Admitting: Internal Medicine

## 2023-03-07 MED ORDER — TIRZEPATIDE 7.5 MG/0.5ML ~~LOC~~ SOAJ: 7.5000 mg | SUBCUTANEOUS | 3 refills | Status: DC

## 2023-03-11 ENCOUNTER — Other Ambulatory Visit (HOSPITAL_COMMUNITY): Payer: Self-pay

## 2023-03-14 ENCOUNTER — Other Ambulatory Visit (HOSPITAL_COMMUNITY): Payer: Self-pay

## 2023-03-18 ENCOUNTER — Other Ambulatory Visit: Payer: Self-pay | Admitting: Internal Medicine

## 2023-03-19 ENCOUNTER — Other Ambulatory Visit (HOSPITAL_COMMUNITY): Payer: Self-pay

## 2023-03-19 MED ORDER — MELOXICAM 15 MG PO TABS: 15.0000 mg | ORAL_TABLET | Freq: Every day | ORAL | 3 refills | Status: AC | PRN

## 2023-03-19 MED ORDER — TRAMADOL HCL 50 MG PO TABS: 50.0000 mg | ORAL_TABLET | Freq: Four times a day (QID) | ORAL | 2 refills | Status: DC | PRN

## 2023-03-21 MED ORDER — TIRZEPATIDE 7.5 MG/0.5ML ~~LOC~~ SOAJ
7.5000 mg | SUBCUTANEOUS | 3 refills | Status: DC
Start: 2023-03-21 — End: 2023-06-05

## 2023-03-21 NOTE — Addendum Note (Signed)
Addended by: Corwin Levins on: 03/21/2023 04:49 PM   Modules accepted: Orders

## 2023-03-22 ENCOUNTER — Other Ambulatory Visit (HOSPITAL_COMMUNITY): Payer: Self-pay

## 2023-03-22 ENCOUNTER — Telehealth (HOSPITAL_COMMUNITY): Payer: Self-pay | Admitting: Pharmacy Technician

## 2023-03-22 NOTE — Telephone Encounter (Signed)
Pharmacy Patient Advocate Encounter   Received notification from CoverMyMeds that prior authorization for [facility] 7.5MG /0.5ML PEN is required/requested.   Insurance verification completed.   The patient is insured through [facility] .   Per test claim: PA required; PA submitted to above mentioned insurance via CoverMyMeds Key/confirmation #/EOC [id number] Status is pending

## 2023-03-24 ENCOUNTER — Other Ambulatory Visit: Payer: Self-pay | Admitting: Internal Medicine

## 2023-03-25 ENCOUNTER — Other Ambulatory Visit: Payer: Self-pay

## 2023-03-25 NOTE — Telephone Encounter (Signed)
Pharmacy Patient Advocate Encounter  Received notification from [employer] that Prior Authorization for  [facility] 7.5MG /0.5ML PEN  has been DENIED.  Full denial letter will be uploaded to the media tab. See denial reason below.   PA #/Case ID/Reference #: [id number]

## 2023-03-26 ENCOUNTER — Telehealth: Payer: Self-pay

## 2023-03-26 ENCOUNTER — Other Ambulatory Visit (HOSPITAL_COMMUNITY): Payer: Self-pay

## 2023-03-27 DIAGNOSIS — M170 Bilateral primary osteoarthritis of knee: Secondary | ICD-10-CM | POA: Diagnosis not present

## 2023-03-27 NOTE — Telephone Encounter (Signed)
 Betty Green

## 2023-03-28 NOTE — Telephone Encounter (Signed)
Copied from CRM [id number]. Topic: Clinical - Prescription Issue >> Mar 28, 2023  3:00 PM Jon Gills C wrote:  Reason for CRM: Patient called in stated she spoke with her insurance regarding the prior auth tirzepatide [facility]) 7.5 MG/0.5ML Pen stated they need the documents that was used for the 5.0 for the 7.5 in order for him to approve the 7.5 dosage

## 2023-03-29 ENCOUNTER — Other Ambulatory Visit (HOSPITAL_COMMUNITY): Payer: Self-pay

## 2023-03-29 NOTE — Telephone Encounter (Signed)
Sorry no, pt does not have DM,   maybe we can switch to zepbound?

## 2023-04-01 ENCOUNTER — Telehealth: Payer: Self-pay

## 2023-04-01 ENCOUNTER — Other Ambulatory Visit (HOSPITAL_COMMUNITY): Payer: Self-pay

## 2023-04-01 NOTE — Telephone Encounter (Signed)
 Pharmacy Patient Advocate Encounter   Received notification from Pt Calls Messages that prior authorization for Zepbound 7.5mg /0.29ml is required/requested.   Insurance verification completed.   The patient is insured through [employer] .   Per test claim: PA required; PA submitted to above mentioned insurance via CoverMyMeds Key/confirmation #/EOC [id number] Status is pending

## 2023-04-01 NOTE — Telephone Encounter (Signed)
 Pharmacy Patient Advocate Encounter  Received notification from [employer] that Prior Authorization for Zepbound  has been DENIED.  See denial reason below. No denial letter attached in CMM. Will attach denial letter to Media tab once received.   PA #/Case ID/Reference #: [id number]

## 2023-04-03 DIAGNOSIS — M170 Bilateral primary osteoarthritis of knee: Secondary | ICD-10-CM | POA: Diagnosis not present

## 2023-04-10 ENCOUNTER — Ambulatory Visit: Payer: Medicare HMO | Admitting: Internal Medicine

## 2023-04-10 DIAGNOSIS — M25512 Pain in left shoulder: Secondary | ICD-10-CM | POA: Diagnosis not present

## 2023-04-10 DIAGNOSIS — M170 Bilateral primary osteoarthritis of knee: Secondary | ICD-10-CM | POA: Diagnosis not present

## 2023-05-08 ENCOUNTER — Other Ambulatory Visit: Payer: Self-pay | Admitting: Internal Medicine

## 2023-05-09 ENCOUNTER — Other Ambulatory Visit: Payer: Self-pay

## 2023-05-10 ENCOUNTER — Ambulatory Visit: Admitting: Internal Medicine

## 2023-05-22 ENCOUNTER — Ambulatory Visit: Admitting: Internal Medicine

## 2023-05-24 ENCOUNTER — Telehealth: Payer: Self-pay

## 2023-05-24 ENCOUNTER — Other Ambulatory Visit (HOSPITAL_COMMUNITY): Payer: Self-pay

## 2023-05-24 NOTE — Telephone Encounter (Signed)
 Ozempic /Mounjaro / Rybelsus  is approved exclusively as an adjunct to diet and exercise to improve glycemic control in adults with type 2 diabetes mellitus. A review of patient's medical chart reveals no documented diagnosis of type 2 diabetes or an A1C indicative of diabetes. Therefore, they do not currently meet the criteria for prior authorization of this medication. If clinically appropriate, alternative options such as Saxenda , Zepbound , or Wegovy  may be considered for this patient. Will need chart notes to support prior auth. Key [id number]

## 2023-06-05 ENCOUNTER — Encounter: Payer: Self-pay | Admitting: Internal Medicine

## 2023-06-05 ENCOUNTER — Ambulatory Visit (INDEPENDENT_AMBULATORY_CARE_PROVIDER_SITE_OTHER): Admitting: Internal Medicine

## 2023-06-05 VITALS — BP 128/82 | HR 95 | Temp 98.0°F | Ht 60.0 in | Wt 272.0 lb

## 2023-06-05 DIAGNOSIS — I10 Essential (primary) hypertension: Secondary | ICD-10-CM | POA: Diagnosis not present

## 2023-06-05 DIAGNOSIS — E538 Deficiency of other specified B group vitamins: Secondary | ICD-10-CM | POA: Diagnosis not present

## 2023-06-05 DIAGNOSIS — E7849 Other hyperlipidemia: Secondary | ICD-10-CM | POA: Diagnosis not present

## 2023-06-05 DIAGNOSIS — R739 Hyperglycemia, unspecified: Secondary | ICD-10-CM

## 2023-06-05 DIAGNOSIS — R7303 Prediabetes: Secondary | ICD-10-CM

## 2023-06-05 DIAGNOSIS — R600 Localized edema: Secondary | ICD-10-CM

## 2023-06-05 DIAGNOSIS — Z0000 Encounter for general adult medical examination without abnormal findings: Secondary | ICD-10-CM

## 2023-06-05 DIAGNOSIS — Z0001 Encounter for general adult medical examination with abnormal findings: Secondary | ICD-10-CM

## 2023-06-05 DIAGNOSIS — E559 Vitamin D deficiency, unspecified: Secondary | ICD-10-CM | POA: Diagnosis not present

## 2023-06-05 DIAGNOSIS — Z1231 Encounter for screening mammogram for malignant neoplasm of breast: Secondary | ICD-10-CM

## 2023-06-05 LAB — VITAMIN B12: Vitamin B-12: >1537 pg/mL — ABNORMAL HIGH (ref 211–911)

## 2023-06-05 LAB — HEPATIC FUNCTION PANEL
ALT: 18 U/L (ref 0–35)
AST: 15 U/L (ref 0–37)
Albumin: 3.9 g/dL (ref 3.5–5.2)
Alkaline Phosphatase: 93 U/L (ref 39–117)
Bilirubin, Direct: 0.1 mg/dL (ref 0.0–0.3)
Total Bilirubin: 0.3 mg/dL (ref 0.2–1.2)
Total Protein: 7.7 g/dL (ref 6.0–8.3)

## 2023-06-05 LAB — VITAMIN D 25 HYDROXY (VIT D DEFICIENCY, FRACTURES): VITD: 23.54 ng/mL — ABNORMAL LOW (ref 30.00–100.00)

## 2023-06-05 LAB — BASIC METABOLIC PANEL WITH GFR
BUN: 17 mg/dL (ref 6–23)
CO2: 32 meq/L (ref 19–32)
Calcium: 10.6 mg/dL — ABNORMAL HIGH (ref 8.4–10.5)
Chloride: 101 meq/L (ref 96–112)
Creatinine, Ser: 1 mg/dL (ref 0.40–1.20)
GFR: 53.77 mL/min — ABNORMAL LOW (ref >60.00)
Glucose, Bld: 89 mg/dL (ref 70–99)
Potassium: 4.9 meq/L (ref 3.5–5.1)
Sodium: 140 meq/L (ref 135–145)

## 2023-06-05 LAB — LIPID PANEL
Cholesterol: 194 mg/dL (ref 0–200)
HDL: 54.6 mg/dL (ref >39.00)
LDL Cholesterol: 110 mg/dL — ABNORMAL HIGH (ref 0–99)
NonHDL: 138.95
Total CHOL/HDL Ratio: 4
Triglycerides: 146 mg/dL (ref 0.0–149.0)
VLDL: 29.2 mg/dL (ref 0.0–40.0)

## 2023-06-05 LAB — CBC WITH DIFFERENTIAL/PLATELET
Basophils Absolute: 0 10*3/uL (ref 0.0–0.1)
Basophils Relative: 0.6 % (ref 0.0–3.0)
Eosinophils Absolute: 0.2 10*3/uL (ref 0.0–0.7)
Eosinophils Relative: 2.7 % (ref 0.0–5.0)
HCT: 41.4 % (ref 36.0–46.0)
Hemoglobin: 13.5 g/dL (ref 12.0–15.0)
Lymphocytes Relative: 26.2 % (ref 12.0–46.0)
Lymphs Abs: 1.9 10*3/uL (ref 0.7–4.0)
MCHC: 32.6 g/dL (ref 30.0–36.0)
MCV: 90 fl (ref 78.0–100.0)
Monocytes Absolute: 0.7 10*3/uL (ref 0.1–1.0)
Monocytes Relative: 9.2 % (ref 3.0–12.0)
Neutro Abs: 4.5 10*3/uL (ref 1.4–7.7)
Neutrophils Relative %: 61.3 % (ref 43.0–77.0)
Platelets: 298 10*3/uL (ref 150.0–400.0)
RBC: 4.6 Mil/uL (ref 3.87–5.11)
RDW: 16.2 % — ABNORMAL HIGH (ref 11.5–15.5)
WBC: 7.3 10*3/uL (ref 4.0–10.5)

## 2023-06-05 LAB — HEMOGLOBIN A1C: Hgb A1c MFr Bld: 5.8 % (ref 4.6–6.5)

## 2023-06-05 LAB — TSH: TSH: 1.3 u[IU]/mL (ref 0.35–5.50)

## 2023-06-05 MED ORDER — FUROSEMIDE 80 MG PO TABS: ORAL_TABLET | ORAL | 3 refills | Status: AC

## 2023-06-05 MED ORDER — TIRZEPATIDE 2.5 MG/0.5ML ~~LOC~~ SOAJ: 2.5000 mg | SUBCUTANEOUS | 11 refills | Status: DC

## 2023-06-05 NOTE — Assessment & Plan Note (Signed)
 Age and sex appropriate education and counseling updated with regular exercise and diet Referrals for preventative services - for mammogram, declines dxa Immunizations addressed - for shingrix and tdap at pharmacy Smoking counseling  - none needed Evidence for depression or other mood disorder - none significant Most recent labs reviewed. I have personally reviewed and have noted: 1) the patient's medical and social history 2) The patient's current medications and supplements 3) The patient's height, weight, and BMI have been recorded in the chart

## 2023-06-05 NOTE — Assessment & Plan Note (Signed)
 Lab Results  Component Value Date   HGBA1C 5.8 06/05/2023   With uncontrolled very severe obesity, pt for mounjaro  2.5 mg weekly

## 2023-06-05 NOTE — Progress Notes (Signed)
 Patient ID: Betty Green, female   DOB: 21-Jun-1945, 78 y.o.   MRN: [id number]         Chief Complaint:: wellness exam and worsening leg edema with feet tightness, bilateral knee arthritis, obesity, hld, preDM, lo vit d       HPI:  Betty Green is a 78 y.o. female here for wellness exam; due for mammogram,declines dxa, for shingrix and tdap at pharmacy, o/w up to date                        Also Pt denies chest pain, increased sob or doe, wheezing, orthopnea, PND, palpitations, dizziness or syncope but has been holding off on taking lasix  as her knee arthritis bilateral is so severe with getting up and down to the BR ; unable to knee TKR per pt due to obesity.  Pt denies polydipsia, polyuria, or new focal neuro s/s.    Pt denies fever, wt loss, night sweats, loss of appetite, or other constitutional symptoms     Wt Readings from Last 3 Encounters:  06/05/23 272 lb (123.4 kg)  01/24/23 277 lb (125.6 kg)  04/30/22 287 lb (130.2 kg)   BP Readings from Last 3 Encounters:  06/05/23 128/82  04/30/22 130/78  03/22/22 130/78   Immunization History  Administered Date(s) Administered   DTaP 10/06/1997   Fluad Quad(high Dose 65+) 11/18/2019, 11/08/2020, 11/24/2021   Influenza Split 10/18/2010, 10/30/2011   Influenza, High Dose Seasonal PF 11/14/2016   Influenza,inj,Quad PF,6+ Mos 10/20/2012, 03/24/2014, 12/14/2015   PFIZER Comirnaty(Gray Top)Covid-19 Tri-Sucrose Vaccine 03/28/2020   PFIZER(Purple Top)SARS-COV-2 Vaccination 04/22/2019, 05/13/2019   Pneumococcal Conjugate-13 07/16/2013   Pneumococcal Polysaccharide-23 08/24/2014   Tdap 10/18/2010   Health Maintenance Due  Topic Date Due   Zoster Vaccines- Shingrix (1 of 2) Never done   DEXA SCAN  02/08/2019   MAMMOGRAM  06/18/2020   DTaP/Tdap/Td (3 - Td or Tdap) 10/17/2020      Past Medical History:  Diagnosis Date   Allergy     Anxiety    "take RX prn"   Arthritis    "all over"   Asthma    Chronic bronchitis (HCC)    GERD  (gastroesophageal reflux disease)    Hypertension    Insomnia 05/31/2011   OSA on CPAP    wears CPAP sometimes (06/23/2014)   Other and unspecified hyperlipidemia 08/04/2012   PONV (postoperative nausea and vomiting)    Shortness of breath dyspnea    with exertion   Shoulder pain, bilateral 10/18/2010   Vitamin D  deficiency 12/21/2015   Past Surgical History:  Procedure Laterality Date   APPENDECTOMY  1966   BREAST EXCISIONAL BIOPSY Right    CHOLECYSTECTOMY OPEN  1981   FRACTURE SURGERY     ORIF ANKLE FRACTURE Right 06/23/2014   ORIF ANKLE FRACTURE Right 06/23/2014   Procedure: Take Down Malunion Right Ankle, Open Reduction Internal Fixation Right Ankle, Possible Syndesmosis Repair, Medial Joint Debridement;  Surgeon: Timothy Ford, MD;  Location: [facility] OR;  Service: Orthopedics;  Laterality: Right;   TONSILLECTOMY  1963   TOTAL ABDOMINAL HYSTERECTOMY  1980   w/BSO    reports that she quit smoking about 39 years ago. Her smoking use included cigarettes. She started smoking about 41 years ago. She has a 0.2 pack-year smoking history. She has never used smokeless tobacco. She reports that she does not drink alcohol and does not use drugs. family history includes Arthritis in her father, mother, and another  family member; Cancer in an other family member; Heart disease in her father and mother; Hypertension in her father and mother. Allergies  Allergen Reactions   Amoxicillin-Pot Clavulanate Hives    Hives   Levofloxacin  Itching   Azithromycin  Other (See Comments)    abd pain   Celebrex  [Celecoxib ] Itching   Lipitor [Atorvastatin ] Itching   Current Outpatient Medications on File Prior to Visit  Medication Sig Dispense Refill   albuterol  (VENTOLIN  HFA) 108 (90 Base) MCG/ACT inhaler Inhale 2 puffs into the lungs every 6 (six) hours as needed for wheezing or shortness of breath. 8 g 5   diclofenac  (VOLTAREN ) 75 MG EC tablet TAKE 1 TABLET BY MOUTH TWICE A DAY 60 tablet 1   ezetimibe  (ZETIA )  10 MG tablet TAKE 1 TABLET BY MOUTH EVERY DAY 90 tablet 3   hydrOXYzine  (ATARAX ) 25 MG tablet TAKE 1-2 TABS BY MOUTH THREE TIMES PER DAY AS NEEDED FOR ITCHING 540 tablet 1   ketoconazole  (NIZORAL ) 2 % cream Apply 1 Application topically daily. 30 g 2   meloxicam  (MOBIC ) 15 MG tablet Take 1 tablet (15 mg total) by mouth daily as needed. 30 tablet 3   ondansetron  (ZOFRAN -ODT) 4 MG disintegrating tablet TAKE 1 TABLET BY MOUTH EVERY 8 HOURS AS NEEDED FOR NAUSEA AND VOMITING 20 tablet 1   traMADol  (ULTRAM ) 50 MG tablet Take 1 tablet (50 mg total) by mouth every 6 (six) hours as needed. 120 tablet 2   acetaminophen  (TYLENOL ) 325 MG tablet Take 2 tablets (650 mg total) by mouth every 6 (six) hours as needed for moderate pain. (Patient not taking: Reported on 06/05/2023) 30 tablet 0   doxycycline  (VIBRA -TABS) 100 MG tablet Take 1 tablet (100 mg total) by mouth 2 (two) times daily. (Patient not taking: Reported on 06/05/2023) 20 tablet 0   hydrochlorothiazide  (HYDRODIURIL ) 25 MG tablet  (Patient not taking: Reported on 06/05/2023)     potassium chloride  (KLOR-CON  10) 10 MEQ tablet Take 1 tablet (10 mEq total) by mouth daily. (Patient not taking: Reported on 06/05/2023) 90 tablet 3   predniSONE  (DELTASONE ) 10 MG tablet 3 tabs by mouth per day for 3 days,2tabs per day for 3 days,1tab per day for 3 days (Patient not taking: Reported on 06/05/2023) 18 tablet 0   No current facility-administered medications on file prior to visit.        ROS:  All others reviewed and negative.  Objective        PE:  BP 128/82 (BP Location: Right Arm, Patient Position: Sitting, Cuff Size: Normal)   Pulse 95   Temp 98 F (36.7 C) (Oral)   Ht 5' (1.524 m)   Wt 272 lb (123.4 kg)   SpO2 95%   BMI 53.12 kg/m                 Constitutional: Pt appears in NAD               HENT: Head: NCAT.                Right Ear: External ear normal.                 Left Ear: External ear normal.                Eyes: . Pupils are equal,  round, and reactive to light. Conjunctivae and EOM are normal               Nose: without d/c or deformity  Neck: Neck supple. Gross normal ROM               Cardiovascular: Normal rate and regular rhythm.                 Pulmonary/Chest: Effort normal and breath sounds without rales or wheezing.                Abd:  Soft, NT, ND, + BS, no organomegaly               Neurological: Pt is alert. At baseline orientation, motor grossly intact               Skin: Skin is warm. No rashes, no other new lesions, LE edema - 1-2+ to knees bilateral right > left               Psychiatric: Pt behavior is normal without agitation   Micro: none  Cardiac tracings I have personally interpreted today:  none  Pertinent Radiological findings (summarize): none   Lab Results  Component Value Date   WBC 7.3 06/05/2023   HGB 13.5 06/05/2023   HCT 41.4 06/05/2023   PLT 298.0 06/05/2023   GLUCOSE 89 06/05/2023   CHOL 194 06/05/2023   TRIG 146.0 06/05/2023   HDL 54.60 06/05/2023   LDLDIRECT 134.1 05/02/2012   LDLCALC 110 (H) 06/05/2023   ALT 18 06/05/2023   AST 15 06/05/2023   NA 140 06/05/2023   K 4.9 06/05/2023   CL 101 06/05/2023   CREATININE 1.00 06/05/2023   BUN 17 06/05/2023   CO2 32 06/05/2023   TSH 1.30 06/05/2023   INR 1.05 06/22/2014   HGBA1C 5.8 06/05/2023   MICROALBUR 2.8 (H) 03/27/2022   Assessment/Plan:  Betty Green is a 78 y.o. Black or African American [2] female with  has a past medical history of Allergy , Anxiety, Arthritis, Asthma, Chronic bronchitis (HCC), GERD (gastroesophageal reflux disease), Hypertension, Insomnia (05/31/2011), OSA on CPAP, Other and unspecified hyperlipidemia (08/04/2012), PONV (postoperative nausea and vomiting), Shortness of breath dyspnea, Shoulder pain, bilateral (10/18/2010), and Vitamin D  deficiency (12/21/2015).  Encounter for well adult exam with abnormal findings Age and sex appropriate education and counseling updated with regular  exercise and diet Referrals for preventative services - for mammogram, declines dxa Immunizations addressed - for shingrix and tdap at pharmacy Smoking counseling  - none needed Evidence for depression or other mood disorder - none significant Most recent labs reviewed. I have personally reviewed and have noted: 1) the patient's medical and social history 2) The patient's current medications and supplements 3) The patient's height, weight, and BMI have been recorded in the chart   Hypertension BP Readings from Last 3 Encounters:  06/05/23 128/82  04/30/22 130/78  03/22/22 130/78   Stable, pt to continue medical treatment hct 25 qd   HLD (hyperlipidemia) Lab Results  Component Value Date   LDLCALC 110 (H) 06/05/2023   uncontrolled, pt declines statin or repatha today   Peripheral edema Mild worsening with holding on lasix  due to knee pain with getting up to the BR; d/w pt - needs to take the lasix  for better control, compression stockings, low salt, leg elevation  Prediabetes Lab Results  Component Value Date   HGBA1C 5.8 06/05/2023   With uncontrolled very severe obesity, pt for mounjaro  2.5 mg weekly   Vitamin D  deficiency Last vitamin D  Lab Results  Component Value Date   VD25OH 23.54 (L) 06/05/2023   Low, to start oral replacement  Followup: Return in about 6 months (around 12/05/2023).  Rosalia Colonel, MD 06/05/2023 8:15 PM [facility]  Primary Care - [facility]

## 2023-06-05 NOTE — Progress Notes (Signed)
 The test results show that your current treatment is OK, as the tests are stable.  Please continue the same plan.  There is no other need for change of treatment or further evaluation based on these results, at this time.  thanks

## 2023-06-05 NOTE — Assessment & Plan Note (Signed)
 Mild worsening with holding on lasix  due to knee pain with getting up to the BR; d/w pt - needs to take the lasix  for better control, compression stockings, low salt, leg elevation

## 2023-06-05 NOTE — Assessment & Plan Note (Signed)
 BP Readings from Last 3 Encounters:  06/05/23 128/82  04/30/22 130/78  03/22/22 130/78   Stable, pt to continue medical treatment hct 25 qd

## 2023-06-05 NOTE — Assessment & Plan Note (Signed)
 Lab Results  Component Value Date   LDLCALC 110 (H) 06/05/2023   uncontrolled, pt declines statin or repatha today

## 2023-06-05 NOTE — Assessment & Plan Note (Signed)
 Last vitamin D  Lab Results  Component Value Date   VD25OH 23.54 (L) 06/05/2023   Low, to start oral replacement

## 2023-06-05 NOTE — Patient Instructions (Signed)
 Ok to restart the Mounajro 2.5 mg  Please continue all other medications as before, and refills have been done if requested - lasix   Please have the pharmacy call with any other refills you may need.  Please continue your efforts at being more active, low cholesterol diet, and weight control.  You are otherwise up to date with prevention measures today.  Please keep your appointments with your specialists as you may have planned  You will be contacted regarding the referral for: mammogram  Please go to the LAB at the blood drawing area for the tests to be done  You will be contacted by phone if any changes need to be made immediately.  Otherwise, you will receive a letter about your results with an explanation, but please check with MyChart first.  Please make an Appointment to return in 6 months, or sooner if needed

## 2023-07-09 ENCOUNTER — Other Ambulatory Visit (HOSPITAL_COMMUNITY): Payer: Self-pay

## 2023-07-09 ENCOUNTER — Telehealth: Payer: Self-pay

## 2023-07-09 NOTE — Telephone Encounter (Signed)
 Ozempic /Mounjaro  is approved exclusively as an adjunct to diet and exercise to improve glycemic control in adults with type 2 diabetes mellitus. A review of patient's medical chart reveals no documented diagnosis of type 2 diabetes or an A1C indicative of diabetes. Therefore, they do not currently meet the criteria for prior authorization of this medication. If clinically appropriate, alternative options such as Saxenda , Zepbound , or Wegovy  may be considered for this patient.  Tried to test bill both Zepbound  and Wegovy . Patient insurance has plan benefit exclusion for both meds.

## 2023-07-22 ENCOUNTER — Other Ambulatory Visit (HOSPITAL_COMMUNITY): Payer: Self-pay

## 2023-07-22 ENCOUNTER — Telehealth: Payer: Self-pay

## 2023-07-22 NOTE — Telephone Encounter (Signed)
 Pharmacy Patient Advocate Encounter   Received notification from CoverMyMeds that prior authorization for Mounjaro   is required/requested.   Insurance verification completed.   The patient is insured through [facility] . Last A1C 06/05/23 was 5.8   Per test claim:

## 2023-07-26 ENCOUNTER — Encounter: Payer: Self-pay | Admitting: Internal Medicine

## 2023-07-31 ENCOUNTER — Encounter: Payer: Self-pay | Admitting: Internal Medicine

## 2023-07-31 ENCOUNTER — Telehealth: Payer: Self-pay

## 2023-07-31 ENCOUNTER — Other Ambulatory Visit: Payer: Self-pay | Admitting: Internal Medicine

## 2023-07-31 ENCOUNTER — Other Ambulatory Visit (HOSPITAL_COMMUNITY): Payer: Self-pay

## 2023-07-31 MED ORDER — TIRZEPATIDE-WEIGHT MANAGEMENT 2.5 MG/0.5ML ~~LOC~~ SOLN: 2.5000 mg | SUBCUTANEOUS | 11 refills | Status: DC

## 2023-07-31 NOTE — Telephone Encounter (Signed)
 Pharmacy Patient Advocate Encounter   Received notification from Pt Calls Messages that prior authorization for Zepbound  2.5mg /0.41ml is required/requested.   Insurance verification completed.   The patient is insured through [facility] .   Per test claim: PA required; PA submitted to above mentioned insurance via CoverMyMeds Key/confirmation #/EOC [id number] Status is pending   Dx: OSA

## 2023-07-31 NOTE — Telephone Encounter (Signed)
 Message sent to PA team.

## 2023-08-01 ENCOUNTER — Other Ambulatory Visit (HOSPITAL_COMMUNITY): Payer: Self-pay

## 2023-08-01 MED ORDER — TIRZEPATIDE-WEIGHT MANAGEMENT 2.5 MG/0.5ML ~~LOC~~ SOLN
2.5000 mg | SUBCUTANEOUS | 11 refills | Status: DC
Start: 2023-08-01 — End: 2023-10-09

## 2023-08-01 NOTE — Telephone Encounter (Signed)
 Pharmacy Patient Advocate Encounter  Received notification from [employer] that Prior Authorization for Zepbound  2.5mg /0.54ml pen has been APPROVED from 07/31/23 to 02/05/24. Ran test claim, Copay is $12.15. This test claim was processed through [facility]- copay amounts may vary at other pharmacies due to pharmacy/plan contracts, or as the patient moves through the different stages of their insurance plan.   PA #/Case ID/Reference #: [id number]  Please send in an order to the pharmacy for the pens.

## 2023-08-01 NOTE — Telephone Encounter (Signed)
 Ok this is noted, thanks

## 2023-08-01 NOTE — Telephone Encounter (Signed)
 Betty Green

## 2023-08-01 NOTE — Telephone Encounter (Signed)
 Ok this is done

## 2023-08-01 NOTE — Addendum Note (Signed)
 Addended by: NORLEEN LYNWOOD ORN on: 08/01/2023 10:15 AM   Modules accepted: Orders

## 2023-08-05 ENCOUNTER — Encounter

## 2023-08-05 ENCOUNTER — Encounter: Payer: Self-pay | Admitting: Internal Medicine

## 2023-08-05 MED ORDER — ERYTHROMYCIN 5 MG/GM OP OINT: 1.0000 | TOPICAL_OINTMENT | Freq: Four times a day (QID) | OPHTHALMIC | 0 refills | Status: AC

## 2023-08-12 ENCOUNTER — Other Ambulatory Visit: Payer: Self-pay | Admitting: Internal Medicine

## 2023-08-13 ENCOUNTER — Other Ambulatory Visit (HOSPITAL_COMMUNITY): Payer: Self-pay

## 2023-08-13 ENCOUNTER — Other Ambulatory Visit: Payer: Self-pay | Admitting: Internal Medicine

## 2023-08-13 ENCOUNTER — Telehealth: Payer: Self-pay

## 2023-08-13 NOTE — Telephone Encounter (Signed)
 Ok tramadol  done erx

## 2023-08-13 NOTE — Telephone Encounter (Signed)
 Pharmacy Patient Advocate Encounter   Received notification from Patient Pharmacy that prior authorization for Mounjaro  2.5mg /0.11ml is required/requested.   Insurance verification completed.   The patient is insured through [facility] .   Patient has received an approval for Zepbound  for weight loss..   Patient does not meet the criteria for Mounjaro , and the insurance will no longer pay for a diagnosis of prediabetes and weight loss.   Key: [id number]

## 2023-08-16 DIAGNOSIS — H20052 Hypopyon, left eye: Secondary | ICD-10-CM | POA: Diagnosis not present

## 2023-08-19 DIAGNOSIS — H20052 Hypopyon, left eye: Secondary | ICD-10-CM | POA: Diagnosis not present

## 2023-08-31 ENCOUNTER — Other Ambulatory Visit: Payer: Self-pay | Admitting: Internal Medicine

## 2023-09-10 DIAGNOSIS — H35353 Cystoid macular degeneration, bilateral: Secondary | ICD-10-CM | POA: Diagnosis not present

## 2023-10-04 ENCOUNTER — Other Ambulatory Visit (HOSPITAL_COMMUNITY): Payer: Self-pay

## 2023-10-04 ENCOUNTER — Telehealth: Payer: Self-pay

## 2023-10-04 NOTE — Telephone Encounter (Signed)
 Copied from CRM [id number]. Topic: Clinical - Medication Prior Auth >> Oct 03, 2023  5:04 PM Chiquita SQUIBB wrote: Reason for CRM: Awanda from [facility] is calling in stating they need a prior authorization for the tirzepatide  (MOUNJARO ) 2.5 MG/0.5ML Pen [[id number]]. If any questions or to start the request please contact [phone]. Please contact the patient when the prior authorization has been approved.

## 2023-10-09 MED ORDER — WEGOVY 0.25 MG/0.5ML ~~LOC~~ SOAJ
0.2500 mg | SUBCUTANEOUS | 11 refills | Status: DC
Start: 2023-10-09 — End: 2023-10-29

## 2023-10-09 NOTE — Telephone Encounter (Signed)
 Pt would need to stop the zepbound  due to these symptoms, and not take Mounjaro  either as they are the same medication with different names  We could consider change to wegovy  (same thing as ozempic ) if she likes

## 2023-10-09 NOTE — Telephone Encounter (Signed)
 Ok for wegovy  - done erx

## 2023-10-10 ENCOUNTER — Ambulatory Visit: Admitting: Internal Medicine

## 2023-10-15 ENCOUNTER — Ambulatory Visit: Admitting: Internal Medicine

## 2023-10-17 ENCOUNTER — Ambulatory Visit: Admitting: Internal Medicine

## 2023-10-17 ENCOUNTER — Telehealth: Payer: Self-pay | Admitting: Radiology

## 2023-10-17 NOTE — Telephone Encounter (Signed)
 Copied from CRM [id number]. Topic: Clinical - Medication Prior Auth >> Oct 17, 2023 11:29 AM Robinson H wrote: Reason for CRM: Donia with [employer] is calling stating they need a prior authorization for the semaglutide -weight management (WEGOVY ) 0.25 MG/0.5ML SOAJ SQ injection since it is non formulary.  Donia [employer] [phone]

## 2023-10-22 ENCOUNTER — Ambulatory Visit: Admitting: Internal Medicine

## 2023-10-24 ENCOUNTER — Other Ambulatory Visit (HOSPITAL_COMMUNITY): Payer: Self-pay

## 2023-10-24 ENCOUNTER — Telehealth: Payer: Self-pay

## 2023-10-24 NOTE — Telephone Encounter (Signed)
 Pharmacy Patient Advocate Encounter   Received notification from Pt Calls Messages that prior authorization for Wegovy  0.25mg /0.67ml is required/requested.   Insurance verification completed.   The patient is insured through [facility].   Per test claim: PA required; PA submitted to above mentioned insurance via Latent Key/confirmation #/EOC [facility] Status is pending

## 2023-10-24 NOTE — Telephone Encounter (Signed)
 See aug 29 note on chart, should be clear I think

## 2023-10-25 NOTE — Telephone Encounter (Signed)
 Pharmacy Patient Advocate Encounter  Received notification from [employer] that Prior Authorization for Wegovy  0.25mg /0.72ml has been DENIED.  See denial reason below. No denial letter attached in CMM. Will attach denial letter to Media tab once received.   PA #/Case ID/Reference #: [id number]

## 2023-10-28 ENCOUNTER — Encounter: Payer: Self-pay | Admitting: Internal Medicine

## 2023-10-28 ENCOUNTER — Telehealth: Admitting: Physician Assistant

## 2023-10-28 ENCOUNTER — Ambulatory Visit: Admitting: Internal Medicine

## 2023-10-28 DIAGNOSIS — Z91199 Patient's noncompliance with other medical treatment and regimen due to unspecified reason: Secondary | ICD-10-CM

## 2023-10-28 NOTE — Progress Notes (Signed)
 The patient no-showed for appointment despite this provider sending direct link with no response and waiting for at least 10 minutes from appointment time for patient to join. They will be marked as a NS for this appointment/time.   Piedad Climes, PA-C

## 2023-10-29 DIAGNOSIS — H3581 Retinal edema: Secondary | ICD-10-CM | POA: Diagnosis not present

## 2023-10-29 DIAGNOSIS — Z79899 Other long term (current) drug therapy: Secondary | ICD-10-CM | POA: Diagnosis not present

## 2023-10-29 DIAGNOSIS — H16002 Unspecified corneal ulcer, left eye: Secondary | ICD-10-CM | POA: Diagnosis not present

## 2023-10-29 DIAGNOSIS — Z961 Presence of intraocular lens: Secondary | ICD-10-CM | POA: Diagnosis not present

## 2023-10-29 DIAGNOSIS — H35033 Hypertensive retinopathy, bilateral: Secondary | ICD-10-CM | POA: Diagnosis not present

## 2023-10-29 DIAGNOSIS — H35063 Retinal vasculitis, bilateral: Secondary | ICD-10-CM | POA: Diagnosis not present

## 2023-10-29 DIAGNOSIS — H30033 Focal chorioretinal inflammation, peripheral, bilateral: Secondary | ICD-10-CM | POA: Diagnosis not present

## 2023-10-29 MED ORDER — WEGOVY 0.25 MG/0.5ML ~~LOC~~ SOAJ: 0.2500 mg | SUBCUTANEOUS | 11 refills | Status: AC

## 2023-10-30 ENCOUNTER — Encounter: Payer: Self-pay | Admitting: Internal Medicine

## 2023-11-05 ENCOUNTER — Encounter: Payer: Self-pay | Admitting: Internal Medicine

## 2023-11-05 ENCOUNTER — Ambulatory Visit: Payer: Self-pay | Admitting: Internal Medicine

## 2023-11-05 ENCOUNTER — Ambulatory Visit: Admitting: Internal Medicine

## 2023-11-05 VITALS — BP 128/82 | HR 105 | Temp 97.5°F | Ht 60.0 in | Wt 273.0 lb

## 2023-11-05 DIAGNOSIS — E559 Vitamin D deficiency, unspecified: Secondary | ICD-10-CM | POA: Diagnosis not present

## 2023-11-05 DIAGNOSIS — E2839 Other primary ovarian failure: Secondary | ICD-10-CM

## 2023-11-05 DIAGNOSIS — N1831 Chronic kidney disease, stage 3a: Secondary | ICD-10-CM

## 2023-11-05 DIAGNOSIS — Z6841 Body Mass Index (BMI) 40.0 and over, adult: Secondary | ICD-10-CM

## 2023-11-05 DIAGNOSIS — M1990 Unspecified osteoarthritis, unspecified site: Secondary | ICD-10-CM | POA: Diagnosis not present

## 2023-11-05 DIAGNOSIS — R7303 Prediabetes: Secondary | ICD-10-CM

## 2023-11-05 DIAGNOSIS — R600 Localized edema: Secondary | ICD-10-CM | POA: Diagnosis not present

## 2023-11-05 DIAGNOSIS — Z23 Encounter for immunization: Secondary | ICD-10-CM | POA: Diagnosis not present

## 2023-11-05 DIAGNOSIS — E7849 Other hyperlipidemia: Secondary | ICD-10-CM | POA: Diagnosis not present

## 2023-11-05 DIAGNOSIS — I10 Essential (primary) hypertension: Secondary | ICD-10-CM

## 2023-11-05 DIAGNOSIS — R269 Unspecified abnormalities of gait and mobility: Secondary | ICD-10-CM

## 2023-11-05 LAB — HEPATIC FUNCTION PANEL
ALT: 23 U/L (ref 0–35)
AST: 15 U/L (ref 0–37)
Albumin: 4 g/dL (ref 3.5–5.2)
Alkaline Phosphatase: 81 U/L (ref 39–117)
Bilirubin, Direct: 0.1 mg/dL (ref 0.0–0.3)
Total Bilirubin: 0.4 mg/dL (ref 0.2–1.2)
Total Protein: 7.8 g/dL (ref 6.0–8.3)

## 2023-11-05 LAB — CBC WITH DIFFERENTIAL/PLATELET
Basophils Absolute: 0.1 K/uL (ref 0.0–0.1)
Basophils Relative: 0.7 % (ref 0.0–3.0)
Eosinophils Absolute: 0 K/uL (ref 0.0–0.7)
Eosinophils Relative: 0.2 % (ref 0.0–5.0)
HCT: 41.5 % (ref 36.0–46.0)
Hemoglobin: 13.6 g/dL (ref 12.0–15.0)
Lymphocytes Relative: 13.3 % (ref 12.0–46.0)
Lymphs Abs: 1.6 K/uL (ref 0.7–4.0)
MCHC: 32.7 g/dL (ref 30.0–36.0)
MCV: 87.1 fl (ref 78.0–100.0)
Monocytes Absolute: 0.5 K/uL (ref 0.1–1.0)
Monocytes Relative: 4.2 % (ref 3.0–12.0)
Neutro Abs: 10 K/uL — ABNORMAL HIGH (ref 1.4–7.7)
Neutrophils Relative %: 81.6 % — ABNORMAL HIGH (ref 43.0–77.0)
Platelets: 333 K/uL (ref 150.0–400.0)
RBC: 4.77 Mil/uL (ref 3.87–5.11)
RDW: 15.6 % — ABNORMAL HIGH (ref 11.5–15.5)
WBC: 12.3 K/uL — ABNORMAL HIGH (ref 4.0–10.5)

## 2023-11-05 LAB — BASIC METABOLIC PANEL WITH GFR
BUN: 22 mg/dL (ref 6–23)
CO2: 28 meq/L (ref 19–32)
Calcium: 10.6 mg/dL — ABNORMAL HIGH (ref 8.4–10.5)
Chloride: 103 meq/L (ref 96–112)
Creatinine, Ser: 0.97 mg/dL (ref 0.40–1.20)
GFR: 55.61 mL/min — ABNORMAL LOW (ref >60.00)
Glucose, Bld: 93 mg/dL (ref 70–99)
Potassium: 4.5 meq/L (ref 3.5–5.1)
Sodium: 141 meq/L (ref 135–145)

## 2023-11-05 LAB — BRAIN NATRIURETIC PEPTIDE: Pro B Natriuretic peptide (BNP): 22 pg/mL (ref 0.0–100.0)

## 2023-11-05 NOTE — Assessment & Plan Note (Signed)
 Severe end stage bilateral knee arthritis, unable for surgury due to weight, pt now for Bedside Commode for home use

## 2023-11-05 NOTE — Assessment & Plan Note (Signed)
 Lab Results  Component Value Date   HGBA1C 5.8 06/05/2023   Stable, pt to continue current medical treatment  - diet,, wt control

## 2023-11-05 NOTE — Progress Notes (Signed)
 Patient ID: Betty Green, female   DOB: 1945-12-18, 78 y.o.   MRN: [id number]        Chief Complaint: follow up bilateral knee arthritis, bilateral leg swelling low vit d, ckd3a, pre DM       HPI:  Betty Green is a 78 y.o. female here with friend for support, has walker with her, no recent falls.  Does have bilateral knee arthritis very painful to even stand, so she often will hold lasix  to avoid going to the bathroom.  Does not have BSC.  Taking 2000 u Vit D,  Pt denies chest pain, increased sob or doe, wheezing, orthopnea, PND,  palpitations, dizziness or syncope but does have persistent 1+ leg edema to mid leg bilateral. .   Pt denies polydipsia, polyuria, or new focal neuro s/s.    Pt denies fever, wt loss, night sweats, loss of appetite, or other constitutional symptoms   Unable to start wegovy  due to cost. Unable for knee surgury due to weight, and is trying to avoid it anyway     Due for flu shot, DXA Wt Readings from Last 3 Encounters:  11/05/23 273 lb (123.8 kg)  06/05/23 272 lb (123.4 kg)  01/24/23 277 lb (125.6 kg)   BP Readings from Last 3 Encounters:  11/05/23 128/82  06/05/23 128/82  04/30/22 130/78         Past Medical History:  Diagnosis Date   Allergy     Anxiety    take RX prn   Arthritis    all over   Asthma    Chronic bronchitis (HCC)    GERD (gastroesophageal reflux disease)    Hypertension    Insomnia 05/31/2011   OSA on CPAP    wears CPAP sometimes (06/23/2014)   Other and unspecified hyperlipidemia 08/04/2012   PONV (postoperative nausea and vomiting)    Shortness of breath dyspnea    with exertion   Shoulder pain, bilateral 10/18/2010   Vitamin D  deficiency 12/21/2015   Past Surgical History:  Procedure Laterality Date   APPENDECTOMY  1966   BREAST EXCISIONAL BIOPSY Right    CHOLECYSTECTOMY OPEN  1981   FRACTURE SURGERY     ORIF ANKLE FRACTURE Right 06/23/2014   ORIF ANKLE FRACTURE Right 06/23/2014   Procedure: Take Down [place] Right Ankle, Open  Reduction Internal Fixation Right Ankle, Possible Syndesmosis Repair, Medial Joint Debridement;  Surgeon: Jerona Harden GAILS, MD;  Location: [facility] OR;  Service: Orthopedics;  Laterality: Right;   TONSILLECTOMY  1963   TOTAL ABDOMINAL HYSTERECTOMY  1980   w/BSO    reports that she quit smoking about 39 years ago. Her smoking use included cigarettes. She started smoking about 41 years ago. She has a 0.2 pack-year smoking history. She has never used smokeless tobacco. She reports that she does not drink alcohol and does not use drugs. family history includes Arthritis in her father, mother, and another family member; Cancer in an other family member; Heart disease in her father and mother; Hypertension in her father and mother. Allergies  Allergen Reactions   Amoxicillin-Pot Clavulanate Hives    Hives   Levofloxacin  Itching   Azithromycin  Other (See Comments)    abd pain   Celebrex  [Celecoxib ] Itching   Lipitor [Atorvastatin ] Itching   Current Outpatient Medications on File Prior to Visit  Medication Sig Dispense Refill   diclofenac  (VOLTAREN ) 75 MG EC tablet TAKE 1 TABLET BY MOUTH TWICE A DAY 60 tablet 1   ezetimibe  (ZETIA ) 10 MG tablet  TAKE 1 TABLET BY MOUTH EVERY DAY 90 tablet 3   furosemide  (LASIX ) 80 MG tablet 1 by mouth twice daily as needed for swelling 180 tablet 3   hydrOXYzine  (ATARAX ) 25 MG tablet TAKE 1-2 TABS BY MOUTH THREE TIMES PER DAY AS NEEDED FOR ITCHING 540 tablet 1   ketoconazole  (NIZORAL ) 2 % cream Apply 1 Application topically daily. 30 g 2   meloxicam  (MOBIC ) 15 MG tablet Take 1 tablet (15 mg total) by mouth daily as needed. 30 tablet 3   predniSONE  (DELTASONE ) 10 MG tablet Take by mouth.     traMADol  (ULTRAM ) 50 MG tablet TAKE 1 TABLET BY MOUTH EVERY 6 HOURS AS NEEDED. 120 tablet 2   hydrochlorothiazide  (HYDRODIURIL ) 25 MG tablet  (Patient not taking: Reported on 11/05/2023)     prednisoLONE  acetate (PRED FORTE ) 1 % ophthalmic suspension Place 1 drop into the left eye 4 (four)  times daily. (Patient not taking: Reported on 11/05/2023)     semaglutide -weight management (WEGOVY ) 0.25 MG/0.5ML SOAJ SQ injection Inject 0.25 mg into the skin once a week. (Patient not taking: Reported on 11/05/2023) 2 mL 11   ZEPBOUND  2.5 MG/0.5ML Pen SMARTSIG:2.5 Milligram(s) SUB-Q Once a Week (Patient not taking: Reported on 11/05/2023)     No current facility-administered medications on file prior to visit.        ROS:  All others reviewed and negative.  Objective        PE:  BP 128/82 (BP Location: Left Arm, Patient Position: Sitting, Cuff Size: Normal)   Pulse (!) 105   Temp (!) 97.5 F (36.4 C) (Oral)   Ht 5' (1.524 m)   Wt 273 lb (123.8 kg)   SpO2 98%   BMI 53.32 kg/m                 Constitutional: Pt appears in NAD               HENT: Head: NCAT.                Right Ear: External ear normal.                 Left Ear: External ear normal.                Eyes: . Pupils are equal, round, and reactive to light. Conjunctivae and EOM are normal               Nose: without d/c or deformity               Neck: Neck supple. Gross normal ROM               Cardiovascular: Normal rate and regular rhythm.                 Pulmonary/Chest: Effort normal and breath sounds without rales or wheezing.                Abd:  Soft, NT, ND, + BS, no organomegaly               Neurological: Pt is alert. At baseline orientation, motor grossly intact               Skin: Skin is warm. No rashes, no other new lesions, LE edema - 1+ bilateral leg to mid legs only               Psychiatric: Pt behavior is normal without agitation   Micro: none  Cardiac tracings I have personally interpreted today:  none  Pertinent Radiological findings (summarize): none   Lab Results  Component Value Date   WBC 12.3 (H) 11/05/2023   HGB 13.6 11/05/2023   HCT 41.5 11/05/2023   PLT 333.0 11/05/2023   GLUCOSE 93 11/05/2023   CHOL 194 06/05/2023   TRIG 146.0 06/05/2023   HDL 54.60 06/05/2023   LDLDIRECT  134.1 05/02/2012   LDLCALC 110 (H) 06/05/2023   ALT 23 11/05/2023   AST 15 11/05/2023   NA 141 11/05/2023   K 4.5 11/05/2023   CL 103 11/05/2023   CREATININE 0.97 11/05/2023   BUN 22 11/05/2023   CO2 28 11/05/2023   TSH 1.30 06/05/2023   INR 1.05 06/22/2014   HGBA1C 5.8 06/05/2023   Assessment/Plan:  Betty Green is a 78 y.o. Black or African American [2] female with  has a past medical history of Allergy , Anxiety, Arthritis, Asthma, Chronic bronchitis (HCC), GERD (gastroesophageal reflux disease), Hypertension, Insomnia (05/31/2011), OSA on CPAP, Other and unspecified hyperlipidemia (08/04/2012), PONV (postoperative nausea and vomiting), Shortness of breath dyspnea, Shoulder pain, bilateral (10/18/2010), and Vitamin D  deficiency (12/21/2015).  Vitamin D  deficiency Last vitamin D  Lab Results  Component Value Date   VD25OH 23.54 (L) 06/05/2023   Lowk pt to increase oral replacement to 4000 units qd   Prediabetes Lab Results  Component Value Date   HGBA1C 5.8 06/05/2023   Stable, pt to continue current medical treatment  - diet,, wt control   Peripheral edema Etiology likely venous stasis, but also for bnp and echo r/o cardiac though she is not enthusiastic about looking for other problems  Morbid obesity (HCC) Unable to afford wegovy , cont with lower calorie diet  Hypertension BP Readings from Last 3 Encounters:  11/05/23 128/82  06/05/23 128/82  04/30/22 130/78   Stable, pt to continue medical treatment hct 25 mg qd   HLD (hyperlipidemia) Lab Results  Component Value Date   LDLCALC 110 (H) 06/05/2023   unocntrolled, pt to continue current zetia   10 mg every day, declines other change for now   Arthritis Severe end stage bilateral knee arthritis, unable for surgury due to weight, pt now for Bedside Commode for home use  CKD stage 3a, GFR 45-59 ml/min (HCC) Lab Results  Component Value Date   CREATININE 0.97 11/05/2023   Stable overall, cont to avoid  nephrotoxins  Followup: Return in about 6 months (around 05/04/2024).  Lynwood Rush, MD 11/05/2023 9:02 PM [facility]

## 2023-11-05 NOTE — Assessment & Plan Note (Signed)
 Lab Results  Component Value Date   CREATININE 0.97 11/05/2023   Stable overall, cont to avoid nephrotoxins

## 2023-11-05 NOTE — Assessment & Plan Note (Signed)
 Unable to afford wegovy , cont with lower calorie diet

## 2023-11-05 NOTE — Assessment & Plan Note (Addendum)
 Lab Results  Component Value Date   LDLCALC 110 (H) 06/05/2023   unocntrolled, pt to continue current zetia   10 mg every day, declines other change for now

## 2023-11-05 NOTE — Assessment & Plan Note (Signed)
 Last vitamin D  Lab Results  Component Value Date   VD25OH 23.54 (L) 06/05/2023   Lowk pt to increase oral replacement to 4000 units qd

## 2023-11-05 NOTE — Telephone Encounter (Signed)
 Pt seen in office today.

## 2023-11-05 NOTE — Progress Notes (Signed)
 The test results show that your current treatment is OK, as the tests are stable.  Please continue the same plan.  There is no other need for change of treatment or further evaluation based on these results, at this time.  thanks

## 2023-11-05 NOTE — Assessment & Plan Note (Signed)
 Etiology likely venous stasis, but also for bnp and echo r/o cardiac though she is not enthusiastic about looking for other problems

## 2023-11-05 NOTE — Patient Instructions (Signed)
 You had the flu shot today  Ok to increase the Vit D to 4000 units per day  Please continue all other medications as before, and refills have been done if requested.  Please have the pharmacy call with any other refills you may need.  Please keep your appointments with your specialists as you may have planned  You are given the script for the bedside commode to give to [facility]  You will be contacted regarding the referral for: Bone Density testing, and Echocardiogram  Please go to the LAB at the blood drawing area for the tests to be done  You will be contacted by phone if any changes need to be made immediately.  Otherwise, you will receive a letter about your results with an explanation, but please check with MyChart first.  Please make an Appointment to return in 6 months, or sooner if needed

## 2023-11-05 NOTE — Assessment & Plan Note (Signed)
 BP Readings from Last 3 Encounters:  11/05/23 128/82  06/05/23 128/82  04/30/22 130/78   Stable, pt to continue medical treatment hct 25 mg qd

## 2023-12-05 ENCOUNTER — Ambulatory Visit: Admitting: Internal Medicine

## 2023-12-11 ENCOUNTER — Ambulatory Visit (HOSPITAL_COMMUNITY)

## 2024-01-07 ENCOUNTER — Other Ambulatory Visit: Payer: Self-pay

## 2024-01-07 ENCOUNTER — Encounter: Payer: Self-pay | Admitting: Internal Medicine

## 2024-01-07 MED ORDER — HYDROXYZINE HCL 25 MG PO TABS: ORAL_TABLET | ORAL | 1 refills | Status: AC

## 2024-01-22 ENCOUNTER — Other Ambulatory Visit: Payer: Self-pay | Admitting: Internal Medicine

## 2024-01-27 ENCOUNTER — Ambulatory Visit: Payer: Medicare HMO

## 2024-01-27 VITALS — Ht 60.0 in | Wt 273.0 lb

## 2024-01-27 DIAGNOSIS — Z0000 Encounter for general adult medical examination without abnormal findings: Secondary | ICD-10-CM | POA: Diagnosis not present

## 2024-01-27 NOTE — Progress Notes (Signed)
 "  Chief Complaint  Patient presents with   Medicare Wellness     Subjective:   Betty Green is a 78 y.o. female who presents for a Medicare Annual Wellness Visit.  Visit info / Clinical Intake: Medicare Wellness Visit Type:: Subsequent Annual Wellness Visit Persons participating in visit and providing information:: patient Medicare Wellness Visit Mode:: Telephone If telephone:: video declined Since this visit was completed virtually, some vitals may be partially provided or unavailable. Missing vitals are due to the limitations of the virtual format.: Documented vitals are patient reported If Telephone or Video please confirm:: I connected with patient using audio/video enable telemedicine. I verified patient identity with two identifiers, discussed telehealth limitations, and patient agreed to proceed. Patient Location:: Home Provider Location:: Office Interpreter Needed?: No Pre-visit prep was completed: yes AWV questionnaire completed by patient prior to visit?: no Living arrangements:: with family/others Patient's Overall Health Status Rating: good Typical amount of pain: none Does pain affect daily life?: no Are you currently prescribed opioids?: (!) yes  Dietary Habits and Nutritional Risks How many meals a day?: 2 Eats fruit and vegetables daily?: yes Most meals are obtained by: preparing own meals; eating out In the last 2 weeks, have you had any of the following?: none Diabetic:: no  Functional Status Activities of Daily Living (to include ambulation/medication): Independent Ambulation: Independent with device- listed below Home Assistive Devices/Equipment: Eyeglasses; Johna Finder (specify Type) Medication Administration: Independent Home Management (perform basic housework or laundry): Independent Manage your own finances?: yes Primary transportation is: family / friends Concerns about vision?: no *vision screening is required for WTM* Concerns about hearing?:  no  Fall Screening Falls in the past year?: 0 Number of falls in past year: 0 Was there an injury with Fall?: 0 Fall Risk Category Calculator: 0 Patient Fall Risk Level: Low Fall Risk  Fall Risk Patient at Risk for Falls Due to: No Fall Risks Fall risk Follow up: Falls evaluation completed; Falls prevention discussed  Home and Transportation Safety: All rugs have non-skid backing?: N/A, no rugs All stairs or steps have railings?: N/A, no stairs Grab bars in the bathtub or shower?: (!) no Have non-skid surface in bathtub or shower?: yes Good home lighting?: yes Regular seat belt use?: yes Hospital stays in the last year:: no  Cognitive Assessment Difficulty concentrating, remembering, or making decisions? : no Will 6CIT or Mini Cog be Completed: yes What year is it?: 0 points What month is it?: 0 points Give patient an address phrase to remember (5 components): [address] [place] About what time is it?: 0 points Count backwards from 20 to 1: 0 points Say the months of the year in reverse: 0 points Repeat the address phrase from earlier: 0 points 6 CIT Score: 0 points  Advance Directives (For Healthcare) Does Patient Have a Medical Advance Directive?: No Would patient like information on creating a medical advance directive?: No - Patient declined  Reviewed/Updated  Reviewed/Updated: Reviewed All (Medical, Surgical, Family, Medications, Allergies, Care Teams, Patient Goals)    Allergies (verified) Amoxicillin-pot clavulanate, Levofloxacin , Azithromycin , Celebrex  [celecoxib ], and Lipitor [atorvastatin ]   Current Medications (verified) Outpatient Encounter Medications as of 01/27/2024  Medication Sig   diclofenac  (VOLTAREN ) 75 MG EC tablet TAKE 1 TABLET BY MOUTH TWICE A DAY   ezetimibe  (ZETIA ) 10 MG tablet TAKE 1 TABLET BY MOUTH EVERY DAY   furosemide  (LASIX ) 80 MG tablet 1 by mouth twice daily as needed for swelling   hydrOXYzine  (ATARAX ) 25 MG tablet  TAKE  1-2 TABS BY MOUTH THREE TIMES PER DAY AS NEEDED FOR ITCHING   ketoconazole  (NIZORAL ) 2 % cream Apply 1 Application topically daily.   meloxicam  (MOBIC ) 15 MG tablet Take 1 tablet (15 mg total) by mouth daily as needed.   traMADol  (ULTRAM ) 50 MG tablet TAKE 1 TABLET BY MOUTH EVERY 6 HOURS AS NEEDED   ZEPBOUND  2.5 MG/0.5ML Pen SMARTSIG:2.5 Milligram(s) SUB-Q Once a Week   hydrochlorothiazide  (HYDRODIURIL ) 25 MG tablet  (Patient not taking: Reported on 01/27/2024)   prednisoLONE  acetate (PRED FORTE ) 1 % ophthalmic suspension Place 1 drop into the left eye 4 (four) times daily. (Patient not taking: Reported on 01/27/2024)   semaglutide -weight management (WEGOVY ) 0.25 MG/0.5ML SOAJ SQ injection Inject 0.25 mg into the skin once a week. (Patient not taking: Reported on 01/27/2024)   No facility-administered encounter medications on file as of 01/27/2024.    History: Past Medical History:  Diagnosis Date   Allergy     Anxiety    take RX prn   Arthritis    all over   Asthma    Chronic bronchitis (HCC)    GERD (gastroesophageal reflux disease)    Hypertension    Insomnia 05/31/2011   OSA on CPAP    wears CPAP sometimes (06/23/2014)   Other and unspecified hyperlipidemia 08/04/2012   PONV (postoperative nausea and vomiting)    Shortness of breath dyspnea    with exertion   Shoulder pain, bilateral 10/18/2010   Vitamin D  deficiency 12/21/2015   Past Surgical History:  Procedure Laterality Date   APPENDECTOMY  1966   BREAST EXCISIONAL BIOPSY Right    CHOLECYSTECTOMY OPEN  1981   FRACTURE SURGERY     ORIF ANKLE FRACTURE Right 06/23/2014   ORIF ANKLE FRACTURE Right 06/23/2014   Procedure: Take Down Malunion Right Ankle, Open Reduction Internal Fixation Right Ankle, Possible Syndesmosis Repair, Medial Joint Debridement;  Surgeon: Jerona Harden GAILS, MD;  Location: [facility] OR;  Service: Orthopedics;  Laterality: Right;   TONSILLECTOMY  1963   TOTAL ABDOMINAL HYSTERECTOMY  1980   w/BSO   Family  History  Problem Relation Age of Onset   Arthritis Other    Arthritis Mother    Heart disease Mother    Hypertension Mother    Arthritis Father    Heart disease Father    Hypertension Father    Cancer Other        colon and prostate cancer   Social History   Occupational History   Occupation: retired    Associate Professor: [employer]  Tobacco Use   Smoking status: Former    Current packs/day: 0.00    Average packs/day: 0.1 packs/day for 2.0 years (0.2 ttl pk-yrs)    Types: Cigarettes    Start date: 02/05/1982    Quit date: 02/06/1984    Years since quitting: 40.0   Smokeless tobacco: Never  Vaping Use   Vaping status: Never Used  Substance and Sexual Activity   Alcohol use: No   Drug use: No   Sexual activity: Not Currently   Tobacco Counseling Counseling given: Yes  SDOH Screenings   Food Insecurity: No Food Insecurity (01/27/2024)  Housing: Unknown (01/27/2024)  Transportation Needs: No Transportation Needs (01/27/2024)  Utilities: Not At Risk (01/27/2024)  Alcohol Screen: Low Risk (01/24/2023)  Depression (PHQ2-9): Low Risk (01/27/2024)  Financial Resource Strain: Low Risk (01/24/2023)  Physical Activity: Inactive (01/27/2024)  Social Connections: Moderately Isolated (01/27/2024)  Stress: No Stress Concern Present (01/27/2024)  Tobacco Use: Medium Risk (01/27/2024)  Health Literacy: Adequate Health Literacy (01/27/2024)   See flowsheets for full screening details  Depression Screen PHQ 2 & 9 Depression Scale- Over the past 2 weeks, how often have you been bothered by any of the following problems? Little interest or pleasure in doing things: 0 Feeling down, depressed, or hopeless (PHQ Adolescent also includes...irritable): 0 PHQ-2 Total Score: 0 Trouble falling or staying asleep, or sleeping too much: 0 Feeling tired or having little energy: 0 Poor appetite or overeating (PHQ Adolescent also includes...weight loss): 0 Feeling bad about yourself - or that  you are a failure or have let yourself or your family down: 0 Trouble concentrating on things, such as reading the newspaper or watching television (PHQ Adolescent also includes...like school work): 0 Moving or speaking so slowly that other people could have noticed. Or the opposite - being so fidgety or restless that you have been moving around a lot more than usual: 3 (bilateral knees/legs pain) Thoughts that you would be better off dead, or of hurting yourself in some way: 0 PHQ-9 Total Score: 3 If you checked off any problems, how difficult have these problems made it for you to do your work, take care of things at home, or get along with other people?: Not difficult at all  Depression Treatment Depression Interventions/Treatment : EYV7-0 Score <4 Follow-up Not Indicated; Medication; Currently on Treatment     Goals Addressed               This Visit's Progress     Patient Stated (pt-stated)        Patient stated she plans to continue take meds daily             Objective:    Today's Vitals   01/27/24 1554  Weight: 273 lb (123.8 kg)  Height: 5' (1.524 m)   Body mass index is 53.32 kg/m.  Hearing/Vision screen Hearing Screening - Comments:: Denies hearing difficulties   Vision Screening - Comments:: Wears rx glasses - up to date with routine eye exams with Ami Maree Immunizations and Health Maintenance Health Maintenance  Topic Date Due   Zoster Vaccines- Shingrix (1 of 2) Never done   Bone Density Scan  02/08/2019   Mammogram  06/18/2020   DTaP/Tdap/Td (3 - Td or Tdap) 10/17/2020   Medicare Annual Wellness (AWV)  01/26/2025   Pneumococcal Vaccine: 50+ Years  Completed   Influenza Vaccine  Completed   Hepatitis C Screening  Completed   Meningococcal B Vaccine  Aged Out   COVID-19 Vaccine  Discontinued   Fecal DNA (Cologuard)  Discontinued        Assessment/Plan:  This is a routine wellness examination for Betty Green.  Patient Care Team: Norleen Lynwood ORN, MD as  PCP - General (Internal Medicine) Jude Harden GAILS, MD as Consulting Physician (Pulmonary Disease) Maree Lolling, MD as Referring Physician (Ophthalmology) Paticia Allene Maree, MD (Ophthalmology)  I have personally reviewed and noted the following in the patients chart:   Medical and social history Use of alcohol, tobacco or illicit drugs  Current medications and supplements including opioid prescriptions. Functional ability and status Nutritional status Physical activity Advanced directives List of other physicians Hospitalizations, surgeries, and ER visits in previous 12 months Vitals Screenings to include cognitive, depression, and falls Referrals and appointments  No orders of the defined types were placed in this encounter.  In addition, I have reviewed and discussed with patient certain preventive protocols, quality metrics, and best practice recommendations. A written personalized care plan  for preventive services as well as general preventive health recommendations were provided to patient.   Verdie CHRISTELLA Saba, CMA   01/27/2024   Return in 1 year (on 01/26/2025).  After Visit Summary: (MyChart) Due to this being a telephonic visit, the after visit summary with patients personalized plan was offered to patient via MyChart   Nurse Notes: scheduled appt w/PCP for 02/2024. "

## 2024-01-27 NOTE — Patient Instructions (Signed)
 Betty Green,  Thank you for taking the time for your Medicare Wellness Visit. I appreciate your continued commitment to your health goals. Please review the care plan we discussed, and feel free to reach out if I can assist you further.  Please note that Annual Wellness Visits do not include a physical exam. Some assessments may be limited, especially if the visit was conducted virtually. If needed, we may recommend an in-person follow-up with your provider.  Ongoing Care Seeing your primary care provider every 3 to 6 months helps us  monitor your health and provide consistent, personalized care.   Referrals If a referral was made during today's visit and you haven't received any updates within two weeks, please contact the referred provider directly to check on the status.  Recommended Screenings:  Health Maintenance  Topic Date Due   Zoster (Shingles) Vaccine (1 of 2) Never done   Osteoporosis screening with Bone Density Scan  02/08/2019   Breast Cancer Screening  06/18/2020   DTaP/Tdap/Td vaccine (3 - Td or Tdap) 10/17/2020   Medicare Annual Wellness Visit  01/26/2025   Pneumococcal Vaccine for age over 42  Completed   Flu Shot  Completed   Hepatitis C Screening  Completed   Meningitis B Vaccine  Aged Out   COVID-19 Vaccine  Discontinued   Cologuard (Stool DNA test)  Discontinued       01/27/2024    3:54 PM  Advanced Directives  Does Patient Have a Medical Advance Directive? No  Would patient like information on creating a medical advance directive? No - Patient declined    Vision: Annual vision screenings are recommended for early detection of glaucoma, cataracts, and diabetic retinopathy. These exams can also reveal signs of chronic conditions such as diabetes and high blood pressure.  Dental: Annual dental screenings help detect early signs of oral cancer, gum disease, and other conditions linked to overall health, including heart disease and diabetes.  Managing Pain  Without Opioids Opioids are strong medicines used to treat moderate to severe pain. For some people, especially those who have long-term (chronic) pain, opioids may not be the best choice for pain management due to: Side effects like nausea, constipation, and sleepiness. The risk of addiction (opioid use disorder). The longer you take opioids, the greater your risk of addiction. Pain that lasts for more than 3 months is called chronic pain. Managing chronic pain usually requires more than one approach and is often provided by a team of health care providers working together (multidisciplinary approach). Pain management may be done at a pain management center or pain clinic. How to manage pain without the use of opioids Use non-opioid medicines Non-opioid medicines for pain may include: Over-the-counter or prescription non-steroidal anti-inflammatory drugs (NSAIDs). These may be the first medicines used for pain. They work well for muscle and bone pain, and they reduce swelling. Acetaminophen . This over-the-counter medicine may work well for milder pain but not swelling. Antidepressants. These may be used to treat chronic pain. A certain type of antidepressant (tricyclics) is often used. These medicines are given in lower doses for pain than when used for depression. Anticonvulsants. These are usually used to treat seizures but may also reduce nerve (neuropathic) pain. Muscle relaxants. These relieve pain caused by sudden muscle tightening (spasms). You may also use a pain medicine that is applied to the skin as a patch, cream, or gel (topical analgesic), such as a numbing medicine. These may cause fewer side effects than medicines taken by mouth. Do certain  therapies as directed Some therapies can help with pain management. They include: Physical therapy. You will do exercises to gain strength and flexibility. A physical therapist may teach you exercises to move and stretch parts of your body that  are weak, stiff, or painful. You can learn these exercises at physical therapy visits and practice them at home. Physical therapy may also involve: Massage. Heat wraps or applying heat or cold to affected areas. Electrical signals that interrupt pain signals (transcutaneous electrical nerve stimulation, TENS). Weak lasers that reduce pain and swelling (low-level laser therapy). Signals from your body that help you learn to regulate pain (biofeedback). Occupational therapy. This helps you to learn ways to function at home and work with less pain. Recreational therapy. This involves trying new activities or hobbies, such as a physical activity or drawing. Mental health therapy, including: Cognitive behavioral therapy (CBT). This helps you learn coping skills for dealing with pain. Acceptance and commitment therapy (ACT) to change the way you think and react to pain. Relaxation therapies, including muscle relaxation exercises and mindfulness-based stress reduction. Pain management counseling. This may be individual, family, or group counseling.  Receive medical treatments Medical treatments for pain management include: Nerve block injections. These may include a pain blocker and anti-inflammatory medicines. You may have injections: Near the spine to relieve chronic back or neck pain. Into joints to relieve back or joint pain. Into nerve areas that supply a painful area to relieve body pain. Into muscles (trigger point injections) to relieve some painful muscle conditions. A medical device placed near your spine to help block pain signals and relieve nerve pain or chronic back pain (spinal cord stimulation device). Acupuncture. Follow these instructions at home Medicines Take over-the-counter and prescription medicines only as told by your health care provider. If you are taking pain medicine, ask your health care providers about possible side effects to watch out for. Do not drive or use  heavy machinery while taking prescription opioid pain medicine. Lifestyle  Do not use drugs or alcohol to reduce pain. If you drink alcohol, limit how much you have to: 0-1 drink a day for women who are not pregnant. 0-2 drinks a day for men. Know how much alcohol is in a drink. In the [place] one drink equals one 12 oz bottle of beer (355 mL), one 5 oz glass of wine (148 mL), or one 1 oz glass of hard liquor (44 mL). Do not use any products that contain nicotine or tobacco. These products include cigarettes, chewing tobacco, and vaping devices, such as e-cigarettes. If you need help quitting, ask your health care provider. Eat a healthy diet and maintain a healthy weight. Poor diet and excess weight may make pain worse. Eat foods that are high in fiber. These include fresh fruits and vegetables, whole grains, and beans. Limit foods that are high in fat and processed sugars, such as fried and sweet foods. Exercise regularly. Exercise lowers stress and may help relieve pain. Ask your health care provider what activities and exercises are safe for you. If your health care provider approves, join an exercise class that combines movement and stress reduction. Examples include yoga and tai chi. Get enough sleep. Lack of sleep may make pain worse. Lower stress as much as possible. Practice stress reduction techniques as told by your therapist. General instructions Work with all your pain management providers to find the treatments that work best for you. You are an important member of your pain management team. There are many  things you can do to reduce pain on your own. Consider joining an online or in-person support group for people who have chronic pain. Keep all follow-up visits. This is important. Where to find more information You can find more information about managing pain without opioids from: American Academy of Pain Medicine: painmed.org Institute for Chronic Pain:  instituteforchronicpain.org American Chronic Pain Association: theacpa.org Contact a health care provider if: You have side effects from pain medicine. Your pain gets worse or does not get better with treatments or home therapy. You are struggling with anxiety or depression. Summary Many types of pain can be managed without opioids. Chronic pain may respond better to pain management without opioids. Pain is best managed when you and a team of health care providers work together. Pain management without opioids may include non-opioid medicines, medical treatments, physical therapy, mental health therapy, and lifestyle changes. Tell your health care providers if your pain gets worse or is not being managed well enough. This information is not intended to replace advice given to you by your health care provider. Make sure you discuss any questions you have with your health care provider. Document Revised: 05/04/2020 Document Reviewed: 05/04/2020 Elsevier Patient Education  2024 [employer].

## 2024-01-29 ENCOUNTER — Other Ambulatory Visit: Payer: Self-pay | Admitting: Internal Medicine

## 2024-01-29 ENCOUNTER — Encounter

## 2024-02-07 ENCOUNTER — Ambulatory Visit: Payer: Self-pay

## 2024-02-07 NOTE — Telephone Encounter (Signed)
 Needs UC please to see if needs further tx for flu like illness or wheezing.   Thanks!

## 2024-02-07 NOTE — Telephone Encounter (Signed)
 FYI Only or Action Required?: Action required by provider: clinical question for provider and update on patient condition.  Patient was last seen in primary care on 11/05/2023 by Norleen Lynwood ORN, MD.  Called Nurse Triage reporting Cough.  Symptoms began several weeks ago.  Interventions attempted: Rest, hydration, or home remedies.  Symptoms are: unchanged.  Triage Disposition: See HCP Within 4 Hours (Or PCP Triage)  Patient/caregiver understands and will follow disposition?: No, wishes to speak with PCP  Copied from CRM #[id number]. Topic: Clinical - Red Word Triage >> Feb 07, 2024  9:35 AM Mia F wrote: Red Word that prompted transfer to Nurse Triage: Wheezing and a very bad cough.  She says she was around some people who had the flu and now she feel sick. She says she is going into the 3rd week of this. She also has some congestion. Reason for Disposition  Wheezing is present  Answer Assessment - Initial Assessment Questions Patient reports dry deep cough for the last three weeks. Patient does reports being around people who ended up having the flu a couple of weeks ago. Patient refusing an appointment for today-only wants to see her provider. Requesting medication be sent to her pharmacy if possible.   1. ONSET: When did the cough begin?      Three weeks ago 2. SEVERITY: How bad is the cough today?      Moderate but can be severe at night 3. SPUTUM: Describe the color of your sputum (e.g., none, dry cough; clear, white, yellow, green)     dry 4. HEMOPTYSIS: Are you coughing up any blood? If Yes, ask: How much? (e.g., flecks, streaks, tablespoons, etc.)     no 5. DIFFICULTY BREATHING: Are you having difficulty breathing? If Yes, ask: How bad is it? (e.g., mild, moderate, severe)      no 6. FEVER: Do you have a fever? If Yes, ask: What is your temperature, how was it measured, and when did it start?     no 7. CARDIAC HISTORY: Do you have any history of heart  disease? (e.g., heart attack, congestive heart failure)      no 8. LUNG HISTORY: Do you have any history of lung disease?  (e.g., pulmonary embolus, asthma, emphysema)     no 9. PE RISK FACTORS: Do you have a history of blood clots? (or: recent major surgery, recent prolonged travel, bedridden)     no 10. OTHER SYMPTOMS: Do you have any other symptoms? (e.g., runny nose, wheezing, chest pain)       Wheezing at times 12. TRAVEL: Have you traveled out of the country in the last month? (e.g., travel history, exposures)       no  Protocols used: Cough - Acute Non-Productive-A-AH

## 2024-02-11 NOTE — Telephone Encounter (Signed)
 Tried to reach pt to inform her of PCP instructions.. per pcp Needs UC please to see if needs further tx for flu like illness or wheezing. Thanks!

## 2024-02-14 ENCOUNTER — Encounter: Admitting: Internal Medicine

## 2024-03-09 ENCOUNTER — Ambulatory Visit: Payer: Medicare (Managed Care) | Admitting: Podiatry

## 2025-02-02 ENCOUNTER — Ambulatory Visit
